# Patient Record
Sex: Female | Born: 1955 | Race: White | Hispanic: No | Marital: Married | Smoking: Former smoker
Health system: Southern US, Community
[De-identification: ages and names within clinical notes are randomized; demographics above are authoritative.]

## PROBLEM LIST (undated history)

## (undated) DIAGNOSIS — C4491 Basal cell carcinoma of skin, unspecified: Secondary | ICD-10-CM

## (undated) DIAGNOSIS — J310 Chronic rhinitis: Secondary | ICD-10-CM

## (undated) DIAGNOSIS — F419 Anxiety disorder, unspecified: Secondary | ICD-10-CM

## (undated) DIAGNOSIS — J449 Chronic obstructive pulmonary disease, unspecified: Secondary | ICD-10-CM

## (undated) DIAGNOSIS — J40 Bronchitis, not specified as acute or chronic: Secondary | ICD-10-CM

## (undated) DIAGNOSIS — K635 Polyp of colon: Secondary | ICD-10-CM

## (undated) DIAGNOSIS — K219 Gastro-esophageal reflux disease without esophagitis: Secondary | ICD-10-CM

## (undated) DIAGNOSIS — C801 Malignant (primary) neoplasm, unspecified: Secondary | ICD-10-CM

## (undated) DIAGNOSIS — E785 Hyperlipidemia, unspecified: Secondary | ICD-10-CM

## (undated) DIAGNOSIS — D649 Anemia, unspecified: Secondary | ICD-10-CM

## (undated) DIAGNOSIS — M1990 Unspecified osteoarthritis, unspecified site: Secondary | ICD-10-CM

## (undated) HISTORY — DX: Malignant (primary) neoplasm, unspecified: C80.1

## (undated) HISTORY — DX: Hyperlipidemia, unspecified: E78.5

## (undated) HISTORY — DX: Anxiety disorder, unspecified: F41.9

## (undated) HISTORY — PX: UPPER GASTROINTESTINAL ENDOSCOPY: SHX188

## (undated) HISTORY — DX: Polyp of colon: K63.5

## (undated) HISTORY — DX: Chronic rhinitis: J31.0

## (undated) HISTORY — PX: APPENDECTOMY: SHX54

## (undated) HISTORY — PX: BACK SURGERY: SHX140

## (undated) HISTORY — PX: COLONOSCOPY: SHX174

## (undated) HISTORY — DX: Basal cell carcinoma of skin, unspecified: C44.91

## (undated) HISTORY — DX: Gastro-esophageal reflux disease without esophagitis: K21.9

## (undated) HISTORY — DX: Bronchitis, not specified as acute or chronic: J40

---

## 1987-05-06 HISTORY — PX: VESICOVAGINAL FISTULA CLOSURE W/ TAH: SUR271

## 1997-10-10 ENCOUNTER — Ambulatory Visit (HOSPITAL_COMMUNITY): Admission: RE | Admit: 1997-10-10 | Discharge: 1997-10-10 | Payer: Self-pay | Admitting: *Deleted

## 1998-03-13 ENCOUNTER — Other Ambulatory Visit: Admission: RE | Admit: 1998-03-13 | Discharge: 1998-03-13 | Payer: Self-pay | Admitting: *Deleted

## 1998-05-01 ENCOUNTER — Encounter: Payer: Self-pay | Admitting: *Deleted

## 1998-05-01 ENCOUNTER — Ambulatory Visit (HOSPITAL_COMMUNITY): Admission: RE | Admit: 1998-05-01 | Discharge: 1998-05-01 | Payer: Self-pay | Admitting: *Deleted

## 1999-05-03 ENCOUNTER — Ambulatory Visit (HOSPITAL_COMMUNITY): Admission: RE | Admit: 1999-05-03 | Discharge: 1999-05-03 | Payer: Self-pay | Admitting: *Deleted

## 1999-05-03 ENCOUNTER — Encounter: Payer: Self-pay | Admitting: *Deleted

## 1999-07-16 ENCOUNTER — Emergency Department (HOSPITAL_COMMUNITY): Admission: EM | Admit: 1999-07-16 | Discharge: 1999-07-16 | Payer: Self-pay | Admitting: *Deleted

## 2000-03-16 ENCOUNTER — Other Ambulatory Visit: Admission: RE | Admit: 2000-03-16 | Discharge: 2000-03-16 | Payer: Self-pay | Admitting: *Deleted

## 2000-05-04 ENCOUNTER — Encounter: Payer: Self-pay | Admitting: *Deleted

## 2000-05-04 ENCOUNTER — Ambulatory Visit (HOSPITAL_COMMUNITY): Admission: RE | Admit: 2000-05-04 | Discharge: 2000-05-04 | Payer: Self-pay | Admitting: *Deleted

## 2001-05-04 ENCOUNTER — Ambulatory Visit (HOSPITAL_COMMUNITY): Admission: RE | Admit: 2001-05-04 | Discharge: 2001-05-04 | Payer: Self-pay | Admitting: *Deleted

## 2001-05-04 ENCOUNTER — Encounter: Payer: Self-pay | Admitting: *Deleted

## 2001-05-06 ENCOUNTER — Encounter: Payer: Self-pay | Admitting: *Deleted

## 2001-05-06 ENCOUNTER — Encounter: Admission: RE | Admit: 2001-05-06 | Discharge: 2001-05-06 | Payer: Self-pay | Admitting: *Deleted

## 2003-03-13 ENCOUNTER — Other Ambulatory Visit: Admission: RE | Admit: 2003-03-13 | Discharge: 2003-03-13 | Payer: Self-pay | Admitting: Obstetrics and Gynecology

## 2004-05-07 ENCOUNTER — Other Ambulatory Visit: Admission: RE | Admit: 2004-05-07 | Discharge: 2004-05-07 | Payer: Self-pay | Admitting: Obstetrics and Gynecology

## 2004-06-13 ENCOUNTER — Ambulatory Visit: Payer: Self-pay | Admitting: Internal Medicine

## 2004-06-19 ENCOUNTER — Ambulatory Visit: Payer: Self-pay | Admitting: Internal Medicine

## 2004-09-02 ENCOUNTER — Ambulatory Visit: Payer: Self-pay | Admitting: Internal Medicine

## 2004-09-13 ENCOUNTER — Ambulatory Visit: Payer: Self-pay | Admitting: Internal Medicine

## 2004-10-16 ENCOUNTER — Ambulatory Visit: Payer: Self-pay | Admitting: Internal Medicine

## 2004-10-24 ENCOUNTER — Ambulatory Visit: Payer: Self-pay | Admitting: Internal Medicine

## 2005-01-23 ENCOUNTER — Ambulatory Visit: Payer: Self-pay | Admitting: Internal Medicine

## 2005-05-09 ENCOUNTER — Ambulatory Visit: Payer: Self-pay | Admitting: Internal Medicine

## 2005-05-29 ENCOUNTER — Ambulatory Visit: Payer: Self-pay | Admitting: Internal Medicine

## 2005-06-12 ENCOUNTER — Other Ambulatory Visit: Admission: RE | Admit: 2005-06-12 | Discharge: 2005-06-12 | Payer: Self-pay | Admitting: Obstetrics and Gynecology

## 2005-06-17 ENCOUNTER — Ambulatory Visit: Payer: Self-pay | Admitting: Internal Medicine

## 2005-07-29 ENCOUNTER — Ambulatory Visit: Payer: Self-pay | Admitting: Internal Medicine

## 2005-09-11 ENCOUNTER — Ambulatory Visit: Payer: Self-pay | Admitting: Pulmonary Disease

## 2005-10-16 ENCOUNTER — Ambulatory Visit: Payer: Self-pay | Admitting: Internal Medicine

## 2005-10-28 ENCOUNTER — Ambulatory Visit: Payer: Self-pay | Admitting: Internal Medicine

## 2006-01-21 ENCOUNTER — Ambulatory Visit: Payer: Self-pay | Admitting: Internal Medicine

## 2006-01-30 ENCOUNTER — Ambulatory Visit: Payer: Self-pay | Admitting: Internal Medicine

## 2006-02-18 ENCOUNTER — Ambulatory Visit: Payer: Self-pay | Admitting: Internal Medicine

## 2006-03-27 ENCOUNTER — Ambulatory Visit: Payer: Self-pay | Admitting: Internal Medicine

## 2006-05-26 ENCOUNTER — Ambulatory Visit: Payer: Self-pay | Admitting: Pulmonary Disease

## 2006-06-08 ENCOUNTER — Ambulatory Visit: Payer: Self-pay | Admitting: Internal Medicine

## 2006-06-18 ENCOUNTER — Ambulatory Visit: Payer: Self-pay | Admitting: Internal Medicine

## 2006-06-18 LAB — CONVERTED CEMR LAB
ALT: 20 units/L (ref 0–40)
AST: 21 units/L (ref 0–37)
Albumin: 4.1 g/dL (ref 3.5–5.2)
Alkaline Phosphatase: 52 units/L (ref 39–117)
BUN: 17 mg/dL (ref 6–23)
Basophils Absolute: 0.1 10*3/uL (ref 0.0–0.1)
Basophils Relative: 1 % (ref 0.0–1.0)
Bilirubin, Direct: 0.1 mg/dL (ref 0.0–0.3)
CO2: 31 meq/L (ref 19–32)
CRP, High Sensitivity: 1 (ref 0.00–5.00)
Calcium: 9.1 mg/dL (ref 8.4–10.5)
Chloride: 101 meq/L (ref 96–112)
Cholesterol: 174 mg/dL (ref 0–200)
Creatinine, Ser: 0.6 mg/dL (ref 0.4–1.2)
Eosinophils Absolute: 0.1 10*3/uL (ref 0.0–0.6)
Eosinophils Relative: 2.5 % (ref 0.0–5.0)
GFR calc Af Amer: 134 mL/min
GFR calc non Af Amer: 111 mL/min
Glucose, Bld: 93 mg/dL (ref 70–99)
HCT: 40.9 % (ref 36.0–46.0)
HDL: 63.3 mg/dL (ref >39.0)
Hemoglobin: 14.3 g/dL (ref 12.0–15.0)
LDL Cholesterol: 92 mg/dL (ref 0–99)
Lymphocytes Relative: 26.7 % (ref 12.0–46.0)
MCHC: 34.9 g/dL (ref 30.0–36.0)
MCV: 89.5 fL (ref 78.0–100.0)
Monocytes Absolute: 0.3 10*3/uL (ref 0.2–0.7)
Monocytes Relative: 5.8 % (ref 3.0–11.0)
Neutro Abs: 3.5 10*3/uL (ref 1.4–7.7)
Neutrophils Relative %: 64 % (ref 43.0–77.0)
Platelets: 250 10*3/uL (ref 150–400)
Potassium: 3.5 meq/L (ref 3.5–5.1)
RBC: 4.56 M/uL (ref 3.87–5.11)
RDW: 11.8 % (ref 11.5–14.6)
Sodium: 138 meq/L (ref 135–145)
Total Bilirubin: 0.8 mg/dL (ref 0.3–1.2)
Total CHOL/HDL Ratio: 2.7
Total Protein: 6.5 g/dL (ref 6.0–8.3)
Triglycerides: 95 mg/dL (ref 0–149)
VLDL: 19 mg/dL (ref 0–40)
WBC: 5.5 10*3/uL (ref 4.5–10.5)

## 2006-07-17 ENCOUNTER — Ambulatory Visit: Payer: Self-pay | Admitting: Internal Medicine

## 2007-02-11 ENCOUNTER — Ambulatory Visit (HOSPITAL_COMMUNITY): Admission: RE | Admit: 2007-02-11 | Discharge: 2007-02-12 | Payer: Self-pay | Admitting: Obstetrics and Gynecology

## 2007-02-24 ENCOUNTER — Ambulatory Visit: Payer: Self-pay | Admitting: Internal Medicine

## 2007-03-04 DIAGNOSIS — E785 Hyperlipidemia, unspecified: Secondary | ICD-10-CM | POA: Insufficient documentation

## 2007-03-04 DIAGNOSIS — J310 Chronic rhinitis: Secondary | ICD-10-CM | POA: Insufficient documentation

## 2007-03-04 DIAGNOSIS — R05 Cough: Secondary | ICD-10-CM

## 2007-03-04 DIAGNOSIS — R059 Cough, unspecified: Secondary | ICD-10-CM | POA: Insufficient documentation

## 2007-03-04 DIAGNOSIS — K219 Gastro-esophageal reflux disease without esophagitis: Secondary | ICD-10-CM | POA: Insufficient documentation

## 2007-03-04 DIAGNOSIS — D126 Benign neoplasm of colon, unspecified: Secondary | ICD-10-CM | POA: Insufficient documentation

## 2007-03-06 HISTORY — PX: OTHER SURGICAL HISTORY: SHX169

## 2007-05-31 ENCOUNTER — Telehealth (INDEPENDENT_AMBULATORY_CARE_PROVIDER_SITE_OTHER): Payer: Self-pay | Admitting: *Deleted

## 2007-06-02 ENCOUNTER — Ambulatory Visit: Payer: Self-pay | Admitting: Internal Medicine

## 2007-06-02 DIAGNOSIS — J019 Acute sinusitis, unspecified: Secondary | ICD-10-CM | POA: Insufficient documentation

## 2007-06-25 ENCOUNTER — Ambulatory Visit: Payer: Self-pay | Admitting: Internal Medicine

## 2007-06-25 DIAGNOSIS — R209 Unspecified disturbances of skin sensation: Secondary | ICD-10-CM | POA: Insufficient documentation

## 2007-06-25 DIAGNOSIS — R1032 Left lower quadrant pain: Secondary | ICD-10-CM | POA: Insufficient documentation

## 2007-06-29 LAB — CONVERTED CEMR LAB
ALT: 18 units/L (ref 0–35)
AST: 18 units/L (ref 0–37)
Albumin: 4.1 g/dL (ref 3.5–5.2)
Alkaline Phosphatase: 75 units/L (ref 39–117)
BUN: 17 mg/dL (ref 6–23)
Basophils Absolute: 0.1 10*3/uL (ref 0.0–0.1)
Basophils Relative: 1.1 % — ABNORMAL HIGH (ref 0.0–1.0)
Bilirubin, Direct: 0.2 mg/dL (ref 0.0–0.3)
CO2: 31 meq/L (ref 19–32)
Calcium: 9.6 mg/dL (ref 8.4–10.5)
Chloride: 103 meq/L (ref 96–112)
Cholesterol: 175 mg/dL (ref 0–200)
Creatinine, Ser: 0.5 mg/dL (ref 0.4–1.2)
Eosinophils Absolute: 0.1 10*3/uL (ref 0.0–0.6)
Eosinophils Relative: 2.3 % (ref 0.0–5.0)
GFR calc Af Amer: 165 mL/min
GFR calc non Af Amer: 137 mL/min
Glucose, Bld: 107 mg/dL — ABNORMAL HIGH (ref 70–99)
HCT: 42.3 % (ref 36.0–46.0)
HDL: 50.6 mg/dL (ref >39.0)
Hemoglobin: 14.2 g/dL (ref 12.0–15.0)
LDL Cholesterol: 103 mg/dL — ABNORMAL HIGH (ref 0–99)
Lymphocytes Relative: 20.2 % (ref 12.0–46.0)
MCHC: 33.6 g/dL (ref 30.0–36.0)
MCV: 92 fL (ref 78.0–100.0)
Monocytes Absolute: 0.4 10*3/uL (ref 0.2–0.7)
Monocytes Relative: 6.6 % (ref 3.0–11.0)
Neutro Abs: 4.3 10*3/uL (ref 1.4–7.7)
Neutrophils Relative %: 69.8 % (ref 43.0–77.0)
Platelets: 234 10*3/uL (ref 150–400)
Potassium: 4 meq/L (ref 3.5–5.1)
RBC: 4.6 M/uL (ref 3.87–5.11)
RDW: 11.8 % (ref 11.5–14.6)
Sodium: 141 meq/L (ref 135–145)
TSH: 0.98 microintl units/mL (ref 0.35–5.50)
Total Bilirubin: 0.8 mg/dL (ref 0.3–1.2)
Total CHOL/HDL Ratio: 3.5
Total Protein: 7.3 g/dL (ref 6.0–8.3)
Triglycerides: 108 mg/dL (ref 0–149)
VLDL: 22 mg/dL (ref 0–40)
WBC: 6.2 10*3/uL (ref 4.5–10.5)

## 2007-08-04 ENCOUNTER — Ambulatory Visit: Payer: Self-pay | Admitting: Internal Medicine

## 2007-09-03 ENCOUNTER — Telehealth (INDEPENDENT_AMBULATORY_CARE_PROVIDER_SITE_OTHER): Payer: Self-pay | Admitting: *Deleted

## 2007-09-21 ENCOUNTER — Ambulatory Visit: Payer: Self-pay | Admitting: Internal Medicine

## 2007-09-21 DIAGNOSIS — M5417 Radiculopathy, lumbosacral region: Secondary | ICD-10-CM | POA: Insufficient documentation

## 2007-10-20 ENCOUNTER — Encounter: Payer: Self-pay | Admitting: Internal Medicine

## 2008-02-02 ENCOUNTER — Ambulatory Visit: Payer: Self-pay | Admitting: Pulmonary Disease

## 2008-06-07 ENCOUNTER — Ambulatory Visit: Payer: Self-pay | Admitting: Internal Medicine

## 2008-06-16 ENCOUNTER — Encounter: Payer: Self-pay | Admitting: Internal Medicine

## 2008-07-07 ENCOUNTER — Encounter (INDEPENDENT_AMBULATORY_CARE_PROVIDER_SITE_OTHER): Payer: Self-pay | Admitting: *Deleted

## 2008-07-12 ENCOUNTER — Ambulatory Visit: Payer: Self-pay | Admitting: Internal Medicine

## 2008-07-14 LAB — CONVERTED CEMR LAB
ALT: 23 units/L (ref 0–35)
AST: 20 units/L (ref 0–37)
Albumin: 4.2 g/dL (ref 3.5–5.2)
Alkaline Phosphatase: 73 units/L (ref 39–117)
BUN: 15 mg/dL (ref 6–23)
Basophils Absolute: 0 10*3/uL (ref 0.0–0.1)
Basophils Relative: 0.1 % (ref 0.0–3.0)
Bilirubin, Direct: 0.2 mg/dL (ref 0.0–0.3)
CO2: 30 meq/L (ref 19–32)
CRP, High Sensitivity: 7 — ABNORMAL HIGH (ref 0.00–5.00)
Calcium: 9.3 mg/dL (ref 8.4–10.5)
Chloride: 102 meq/L (ref 96–112)
Cholesterol: 206 mg/dL (ref 0–200)
Creatinine, Ser: 0.6 mg/dL (ref 0.4–1.2)
Direct LDL: 128.2 mg/dL
Eosinophils Absolute: 0.2 10*3/uL (ref 0.0–0.7)
Eosinophils Relative: 2.6 % (ref 0.0–5.0)
GFR calc Af Amer: 133 mL/min
GFR calc non Af Amer: 110 mL/min
Glucose, Bld: 85 mg/dL (ref 70–99)
HCT: 41.9 % (ref 36.0–46.0)
HDL: 53.8 mg/dL (ref >39.0)
Hemoglobin: 14.6 g/dL (ref 12.0–15.0)
Lymphocytes Relative: 21.9 % (ref 12.0–46.0)
MCHC: 34.8 g/dL (ref 30.0–36.0)
MCV: 88.9 fL (ref 78.0–100.0)
Monocytes Absolute: 0.1 10*3/uL (ref 0.1–1.0)
Monocytes Relative: 1.9 % — ABNORMAL LOW (ref 3.0–12.0)
Neutro Abs: 4.5 10*3/uL (ref 1.4–7.7)
Neutrophils Relative %: 73.5 % (ref 43.0–77.0)
Platelets: 223 10*3/uL (ref 150–400)
Potassium: 3.9 meq/L (ref 3.5–5.1)
RBC: 4.71 M/uL (ref 3.87–5.11)
RDW: 12.7 % (ref 11.5–14.6)
Sodium: 139 meq/L (ref 135–145)
TSH: 1.18 microintl units/mL (ref 0.35–5.50)
Total Bilirubin: 0.9 mg/dL (ref 0.3–1.2)
Total CHOL/HDL Ratio: 3.8
Total Protein: 6.7 g/dL (ref 6.0–8.3)
Triglycerides: 100 mg/dL (ref 0–149)
VLDL: 20 mg/dL (ref 0–40)
Vit D, 25-Hydroxy: 38 ng/mL (ref 30–89)
WBC: 6.2 10*3/uL (ref 4.5–10.5)

## 2008-07-17 ENCOUNTER — Telehealth (INDEPENDENT_AMBULATORY_CARE_PROVIDER_SITE_OTHER): Payer: Self-pay | Admitting: *Deleted

## 2008-08-30 ENCOUNTER — Encounter: Payer: Self-pay | Admitting: Internal Medicine

## 2008-08-30 ENCOUNTER — Emergency Department (HOSPITAL_COMMUNITY): Admission: EM | Admit: 2008-08-30 | Discharge: 2008-08-30 | Payer: Self-pay | Admitting: Emergency Medicine

## 2008-08-31 ENCOUNTER — Ambulatory Visit: Payer: Self-pay | Admitting: Internal Medicine

## 2008-08-31 DIAGNOSIS — F17200 Nicotine dependence, unspecified, uncomplicated: Secondary | ICD-10-CM | POA: Insufficient documentation

## 2008-08-31 DIAGNOSIS — R042 Hemoptysis: Secondary | ICD-10-CM | POA: Insufficient documentation

## 2008-09-02 ENCOUNTER — Ambulatory Visit: Payer: Self-pay | Admitting: Critical Care Medicine

## 2008-09-02 ENCOUNTER — Inpatient Hospital Stay (HOSPITAL_COMMUNITY): Admission: EM | Admit: 2008-09-02 | Discharge: 2008-09-05 | Payer: Self-pay | Admitting: Emergency Medicine

## 2008-09-04 ENCOUNTER — Encounter: Payer: Self-pay | Admitting: Critical Care Medicine

## 2008-09-06 ENCOUNTER — Telehealth (INDEPENDENT_AMBULATORY_CARE_PROVIDER_SITE_OTHER): Payer: Self-pay | Admitting: *Deleted

## 2008-09-07 ENCOUNTER — Ambulatory Visit: Payer: Self-pay | Admitting: Internal Medicine

## 2008-09-14 ENCOUNTER — Ambulatory Visit: Payer: Self-pay | Admitting: Internal Medicine

## 2008-09-22 ENCOUNTER — Ambulatory Visit: Payer: Self-pay | Admitting: Internal Medicine

## 2008-10-17 ENCOUNTER — Ambulatory Visit: Payer: Self-pay | Admitting: Internal Medicine

## 2008-10-18 ENCOUNTER — Ambulatory Visit: Admission: RE | Admit: 2008-10-18 | Discharge: 2008-10-18 | Payer: Self-pay | Admitting: Internal Medicine

## 2008-10-18 ENCOUNTER — Ambulatory Visit: Payer: Self-pay | Admitting: Internal Medicine

## 2008-10-19 ENCOUNTER — Telehealth (INDEPENDENT_AMBULATORY_CARE_PROVIDER_SITE_OTHER): Payer: Self-pay | Admitting: *Deleted

## 2008-10-31 ENCOUNTER — Ambulatory Visit: Payer: Self-pay | Admitting: Internal Medicine

## 2008-10-31 ENCOUNTER — Encounter: Payer: Self-pay | Admitting: Internal Medicine

## 2008-11-01 ENCOUNTER — Encounter: Payer: Self-pay | Admitting: Internal Medicine

## 2008-11-15 ENCOUNTER — Ambulatory Visit: Payer: Self-pay | Admitting: Internal Medicine

## 2009-01-16 ENCOUNTER — Ambulatory Visit: Payer: Self-pay | Admitting: Internal Medicine

## 2009-01-16 DIAGNOSIS — G479 Sleep disorder, unspecified: Secondary | ICD-10-CM | POA: Insufficient documentation

## 2009-01-17 LAB — CONVERTED CEMR LAB
ALT: 19 units/L (ref 0–35)
AST: 22 units/L (ref 0–37)
Albumin: 4.1 g/dL (ref 3.5–5.2)
Alkaline Phosphatase: 72 units/L (ref 39–117)
Bilirubin, Direct: 0 mg/dL (ref 0.0–0.3)
CRP, High Sensitivity: 5.9 — ABNORMAL HIGH (ref 0.00–5.00)
Cholesterol: 206 mg/dL — ABNORMAL HIGH (ref 0–200)
Direct LDL: 132.7 mg/dL
HDL: 54 mg/dL (ref >39.00)
Total Bilirubin: 0.9 mg/dL (ref 0.3–1.2)
Total CHOL/HDL Ratio: 4
Total Protein: 6.8 g/dL (ref 6.0–8.3)
Triglycerides: 181 mg/dL — ABNORMAL HIGH (ref 0.0–149.0)
VLDL: 36.2 mg/dL (ref 0.0–40.0)

## 2009-02-13 ENCOUNTER — Ambulatory Visit: Payer: Self-pay | Admitting: Internal Medicine

## 2009-05-03 ENCOUNTER — Telehealth: Payer: Self-pay | Admitting: Internal Medicine

## 2009-05-08 ENCOUNTER — Ambulatory Visit: Payer: Self-pay | Admitting: Internal Medicine

## 2009-06-27 ENCOUNTER — Ambulatory Visit: Payer: Self-pay | Admitting: Internal Medicine

## 2009-07-02 DIAGNOSIS — J209 Acute bronchitis, unspecified: Secondary | ICD-10-CM | POA: Insufficient documentation

## 2009-07-20 ENCOUNTER — Ambulatory Visit: Payer: Self-pay | Admitting: Internal Medicine

## 2009-10-02 ENCOUNTER — Ambulatory Visit: Payer: Self-pay | Admitting: Internal Medicine

## 2009-10-02 DIAGNOSIS — J449 Chronic obstructive pulmonary disease, unspecified: Secondary | ICD-10-CM | POA: Insufficient documentation

## 2009-10-03 LAB — CONVERTED CEMR LAB
ALT: 25 units/L (ref 0–35)
AST: 27 units/L (ref 0–37)
Albumin: 4.3 g/dL (ref 3.5–5.2)
Alkaline Phosphatase: 68 units/L (ref 39–117)
BUN: 11 mg/dL (ref 6–23)
Basophils Absolute: 0 10*3/uL (ref 0.0–0.1)
Basophils Relative: 0.5 % (ref 0.0–3.0)
Bilirubin, Direct: 0 mg/dL (ref 0.0–0.3)
CO2: 27 meq/L (ref 19–32)
Calcium: 9.4 mg/dL (ref 8.4–10.5)
Chloride: 102 meq/L (ref 96–112)
Cholesterol: 208 mg/dL — ABNORMAL HIGH (ref 0–200)
Creatinine, Ser: 0.6 mg/dL (ref 0.4–1.2)
Direct LDL: 131 mg/dL
Eosinophils Absolute: 0.1 10*3/uL (ref 0.0–0.7)
Eosinophils Relative: 2.4 % (ref 0.0–5.0)
GFR calc non Af Amer: 109.78 mL/min (ref >60)
Glucose, Bld: 95 mg/dL (ref 70–99)
HCT: 40.8 % (ref 36.0–46.0)
HDL: 57.4 mg/dL (ref >39.00)
Hemoglobin: 14 g/dL (ref 12.0–15.0)
Lymphocytes Relative: 24.3 % (ref 12.0–46.0)
Lymphs Abs: 1.3 10*3/uL (ref 0.7–4.0)
MCHC: 34.4 g/dL (ref 30.0–36.0)
MCV: 90.7 fL (ref 78.0–100.0)
Monocytes Absolute: 0.3 10*3/uL (ref 0.1–1.0)
Monocytes Relative: 5.6 % (ref 3.0–12.0)
Neutro Abs: 3.5 10*3/uL (ref 1.4–7.7)
Neutrophils Relative %: 67.2 % (ref 43.0–77.0)
Platelets: 243 10*3/uL (ref 150.0–400.0)
Potassium: 4.1 meq/L (ref 3.5–5.1)
RBC: 4.49 M/uL (ref 3.87–5.11)
RDW: 13.2 % (ref 11.5–14.6)
Sodium: 138 meq/L (ref 135–145)
TSH: 0.96 microintl units/mL (ref 0.35–5.50)
Total Bilirubin: 0.1 mg/dL — ABNORMAL LOW (ref 0.3–1.2)
Total CHOL/HDL Ratio: 4
Total Protein: 7.2 g/dL (ref 6.0–8.3)
Triglycerides: 200 mg/dL — ABNORMAL HIGH (ref 0.0–149.0)
VLDL: 40 mg/dL (ref 0.0–40.0)
Vit D, 25-Hydroxy: 62 ng/mL (ref 30–89)
WBC: 5.3 10*3/uL (ref 4.5–10.5)

## 2010-01-15 ENCOUNTER — Ambulatory Visit: Payer: Self-pay | Admitting: Internal Medicine

## 2010-01-15 DIAGNOSIS — R079 Chest pain, unspecified: Secondary | ICD-10-CM | POA: Insufficient documentation

## 2010-01-15 DIAGNOSIS — R42 Dizziness and giddiness: Secondary | ICD-10-CM | POA: Insufficient documentation

## 2010-01-31 ENCOUNTER — Ambulatory Visit: Payer: Self-pay | Admitting: Internal Medicine

## 2010-02-05 ENCOUNTER — Telehealth (INDEPENDENT_AMBULATORY_CARE_PROVIDER_SITE_OTHER): Payer: Self-pay | Admitting: *Deleted

## 2010-04-11 ENCOUNTER — Telehealth (INDEPENDENT_AMBULATORY_CARE_PROVIDER_SITE_OTHER): Payer: Self-pay | Admitting: *Deleted

## 2010-06-04 NOTE — Assessment & Plan Note (Signed)
Summary: Primary svc/ insomnia/ smoking   Primary Provider/Referring Provider:  [name]  CC:  Acute visit.  Pt c/o trouble sleeping x 2 months.  Pt states that she takes xanax to help her sleep and this helps her fall asleep but she does not stay asleep.  She wakes up about every hour.  Pt also started smoking again- 5 cigs per day.Marland Kitchen  History of Present Illness: 63 yowf active smoker with h/o hyperlipidemia and chronic bronchitis   August 31, 2008 new onset hempotysis assoc with epistaxis started without antecedent uri x 24 hours, abrupt onset,    rx as aecopd .  Sep 02, 2008  to ED with recurrent hemoptysis.  At least 2 cups of blood bright red. admit thru May 4 with FOB 5/3 clot over rul  with neg cytology.  Sep 07, 2008 ov cc osb, mild sense of chest congestion, minimal old blood coughed up x 24 hours.   Sep 14, 2008--ov  follow up and med review. Last visit Advair 250/50 added and prilosec increased two times a day. Over last week, less coughing, w/ less old blood mixed in clear mucous. Starting to feel better, w/ improved energy.  10/18/08 neg fob  November 15, 2008 ov smoking 1-2 per day vs 10 per day.   May 08, 2009 Acute visit.  Pt c/o trouble sleeping x 2 months.  Pt states that she takes xanax to help her sleep and this helps her fall asleep but she does not stay asleep.  She wakes up about every hour.  Pt also started smoking again- 5 cigs per day. no more hemoptysis.  lots of stress re family/work.    Current Medications (verified): 1)  Multivitamins   Tabs (Multiple Vitamin) .... Take 1 Tablet By Mouth Once A Day 2)  Vitamin C 500 Mg  Tabs (Ascorbic Acid) .... Take 1 Tablet By Mouth Once A Day 3)  Omeprazole 20 Mg  Cpdr (Omeprazole) .... Take  One 30-60 Min Before First Meal of The Day 4)  Calcium 500 500 Mg  Tabs (Calcium Carbonate) .... 2 Daily 5)  Vivelle-Dot 0.1 Mg/24hr  Pttw (Estradiol) .... Use As Directed Each Monday and Friday 6)  Vytorin 10-80 Mg  Tabs  (Ezetimibe-Simvastatin) .... 1/2 At Bedtime 7)  Advair Diskus 250-50 Mcg/dose  Misc (Fluticasone-Salmeterol) .... One Puff Once Daily 8)  Miralax   Powd (Polyethylene Glycol 3350) .... Once Daily 9)  Diethylpropion Hcl Cr 75 Mg Xr24h-Tab (Diethylpropion Hcl) .... Take 1 Tablet By Mouth Daily As Needed 10)  Ultram 50 Mg  Tabs (Tramadol Hcl) .... Take One Tab Every Four Hours As Needed For Cough or Pain 11)  Alprazolam 0.5 Mg Tbdp (Alprazolam) .... One Every 6 Hours If Needed For Nerves or Breathessless 12)  Mucinex Dm 30-600 Mg  Tb12 (Dextromethorphan-Guaifenesin) .Marland Kitchen.. 1-2 Every 12 Hours For Congestion or Cough Every 12 Hours 13)  Afrin Nasal Spray 0.05 %  Soln (Oxymetazoline Hcl) .... As Needed 14)  Kenalog  Aers (Triamcinolone Acetonide) in [place] .... Apply Four Times A Day 15)  Valtrex 500 Mg Tabs (Valacyclovir Hcl) .... 1/2 Once Daily 16)  Striant 30 Mg Misc (Testosterone) .... As Directed 3 Times A Wk  Allergies (verified): No Known Drug Allergies  Past History:  Past Medical History: Hx of COUGH (ICD-786.2) Hemoptysis/epistaxis August 31, 2008 ..............................................Marland KitchenWert     - FOB 09/04/2008 clot lodged over rul orifice, CT chest: no  mass neg PE     - FOB  10/18/08 negative GERD (ICD-530.81)     -  Neg EGD 5/99     -  Neg 24h ph study 9/99 POLYP, COLON (ICD-211.3)............................................................Marland KitchenMarina Goodell      - Colonoscopy 07/07/03       - Colonoscopy 10/31/08 :  1) Adenomatous Polyps, multiple (6) removed,  Severe diverticulosis  RHINITIS, CHRONIC (ICD-472.0) HYPERLIPIDEMIA (ICD-272.4)     - Target LDL < 130 (pos fm hx, pos smoking hx) HEALTH MAINTENANCE..................................................................Marland KitchenWert      -  dt  2/06      -  pneumovax 2/07      -  DEXA nl 08/18/06      -  CPX  July 12, 2008     Vital Signs:  Patient profile:   55 year old female Weight:      136 pounds O2 Sat:      98 % on Room  air Temp:     98.2 degrees F oral Pulse rate:   92 / minute BP sitting:   118 / 76  (left arm)  Vitals Entered By: Vernie Murders (May 08, 2009 3:07 PM)  O2 Flow:  Room air  Physical Exam  Additional Exam:  Amb depressed/anxious  wf NAD wt  129 Sep 22, 2008 > 132 November 15, 2008 > 136 January 16, 2009 > 136 May 08, 2009 HEENT: nl dentition, turbinates,  sev aphthos ulcers left buchal mucosa. Nl external ear canals without cough reflex, NM pale  w/ clear d/ch,    Neck without JVD/Nodes/TM Lungs CTA no wheezing or crackles. RRR no s3 or murmur or increase in P2. No edema Abd soft and benign with nl excursion in the supine position. No bruits or organomegaly Ext without clubbing, cyanosis, calf tenderness   Impression & Recommendations:  Problem # 1:  SLEEP DISORDER UNSPECIFIED (ICD-780.50)  Sleep hygiene reviewed, ok to try Tylenol pm and add trazadone next vs refer to psychology.  Orders: Est. Patient Level III (04540)  Problem # 2:  SMOKER (ICD-305.1)  Discussed but not ready to committ to quit at this point - emphasized risks involved in continuing smoking and that patient should consider these in the context of the cost of smoking relative to the benefit obtained.   Orders: Est. Patient Level III (98119)  Medications Added to Medication List This Visit: 1)  Vytorin 10-80 Mg Tabs (Ezetimibe-simvastatin) .... 1/2 at bedtime 2)  Advair Diskus 250-50 Mcg/dose Misc (Fluticasone-salmeterol) .... One puff once daily 3)  Valtrex 500 Mg Tabs (Valacyclovir hcl) .... 1/2 once daily 4)  Striant 30 Mg Misc (Testosterone) .... As directed 3 times a wk  Patient Instructions: 1)  Try to go to bedtime and wake up same time every day 2)  For at least 30 min before bed read with a dull light something dull like a history textbook 3)  If this is ineffective try adding  Tylenol pm one at bedtime Prescriptions: ALPRAZOLAM 0.5 MG TBDP (ALPRAZOLAM) one every 6 hours if needed for  nerves or breathessless  #90 x 0   Entered and Authorized by:   Nyoka Cowden MD   Signed by:   Nyoka Cowden MD on 05/08/2009   Method used:   Print then Give to Patient   RxID:   [id number]

## 2010-06-04 NOTE — Progress Notes (Signed)
Summary: COUGH  Phone Note Call from Patient   Caller: Patient Call For: [name] Summary of Call: PT HAS COUGH SHOULD SHE GET OVER THE COUNTER OR PRESCRIPT COUGH SYRUP PLS ADVISE Initial call taken by: Rickard Patience,  Sep 06, 2008 9:01 AM  Follow-up for Phone Call        Called spoke with pt advised to try OTC Delsym to suppress cough.  Advised pt if cough persists despite addition of Delsym call back for further intervention. Follow-up by: Cloyde Reams RN,  Sep 06, 2008 9:13 AM

## 2010-06-04 NOTE — Assessment & Plan Note (Signed)
Summary: flu vaccine///kp  Nurse Visit    Prior Medications: BAYER LOW STRENGTH 81 MG  TBEC (ASPIRIN) take one tab by mouth once daily OMEPRAZOLE 20 MG  CPDR (OMEPRAZOLE) take one tab by mouth once daily VIVELLE-DOT 0.1 MG/24HR  PTTW (ESTRADIOL) use as directed each monday and friday VYTORIN 10-80 MG  TABS (EZETIMIBE-SIMVASTATIN) take 1/2 tab by mouth once daily DIETHYLPROPION HCL CR 75 MG  TB24 (DIETHYLPROPION HCL) take one tab by mouth once daily as needed MIRALAX   POWD (POLYETHYLENE GLYCOL 3350) once daily FLUOCINONIDE 0.05 %  CREA (FLUOCINONIDE) apply as needed ULTRAM 50 MG  TABS (TRAMADOL HCL) take one tab every four hours as needed AFRIN NASAL SPRAY 0.05 %  SOLN (OXYMETAZOLINE HCL) as needed CALCIUM 500 500 MG  TABS (CALCIUM CARBONATE) as directed MULTIVITAMINS   TABS (MULTIPLE VITAMIN) Take 1 tablet by mouth once a day MUCINEX DM 30-600 MG  TB12 (DEXTROMETHORPHAN-GUAIFENESIN) every 12 hours as needed ADVIL COLD/SINUS 30-200 MG  TABS (PSEUDOEPHEDRINE-IBUPROFEN) as needed PREDNISONE 10 MG  TABS (PREDNISONE) 4 each am x 2days, 2x2days, 1x2days and stop Current Allergies: No known allergies     Orders Added: 1)  Admin 1st Vaccine [90471] 2)  Flu Vaccine 75yrs + [16109]    Flu Vaccine Consent Questions     Do you have a history of severe allergic reactions to this vaccine? no    Any prior history of allergic reactions to egg and/or gelatin? no    Do you have a sensitivity to the preservative Thimersol? no    Do you have a past history of Guillan-Barre Syndrome? no    Do you currently have an acute febrile illness? no    Have you ever had a severe reaction to latex? no    Vaccine information given and explained to patient? yes    Are you currently pregnant? no    Lot Number:[id number]   Site Given  Left Deltoid IM Azucena Kuba, CMA  February 01, 2008 .opcflu   ]

## 2010-06-04 NOTE — Assessment & Plan Note (Signed)
Summary: SINIS/ CONGESTION///KP   Chief Complaint:  sinus pain and pressure x2 weeks - pt states that when she doesn't take 2 advils w/ mucinex every 4 hours she has "bad" pain in her right cheek bone and behind her right eye.  History of Present Illness: 55 year old with known history of chronic rhinitis, GERD and cyclical cough that presents for an acute office visit. Complains of a 2 weeks nasal congestion, sinus pain and pressure, increased right, teeth pain. Used mucinex and nasal sprays without much relief. Denies chest pain, dyspnea, orthopnea, hemoptysis, fever, n/v/d, edema.     Current Allergies (reviewed today): No known allergies  Updated/Current Medications (including changes made in today's visit):  BAYER LOW STRENGTH 81 MG  TBEC (ASPIRIN) take one tab by mouth once daily OMEPRAZOLE 20 MG  CPDR (OMEPRAZOLE) take one tab by mouth once daily VIVELLE-DOT 0.1 MG/24HR  PTTW (ESTRADIOL) use as directed each monday and friday VYTORIN 10-80 MG  TABS (EZETIMIBE-SIMVASTATIN) take 1/2 tab by mouth once daily DIETHYLPROPION HCL CR 75 MG  TB24 (DIETHYLPROPION HCL) take one tab by mouth once daily as needed FLUOCINONIDE 0.05 %  CREA (FLUOCINONIDE) apply as needed ULTRAM 50 MG  TABS (TRAMADOL HCL) take one tab every four hours as needed AFRIN NASAL SPRAY 0.05 %  SOLN (OXYMETAZOLINE HCL) as needed CALCIUM 500 500 MG  TABS (CALCIUM CARBONATE) as directed MULTIVITAMINS   TABS (MULTIPLE VITAMIN) Take 1 tablet by mouth once a day MUCINEX DM 30-600 MG  TB12 (DEXTROMETHORPHAN-GUAIFENESIN) every 12 hours as needed ADVIL COLD/SINUS 30-200 MG  TABS (PSEUDOEPHEDRINE-IBUPROFEN) as needed OMNICEF 300 MG  CAPS (CEFDINIR) 2 by mouth once daily   Past Medical History:        Hx of COUGH (ICD-786.2)    GERD (ICD-530.81)    POLYP, COLON (ICD-211.3)    RHINITIS, CHRONIC (ICD-472.0)    HYPERLIPIDEMIA (ICD-272.4)          Review of Systems      See HPI   Vital Signs:  Patient Profile:   55  Years Old Female Weight:      141.38 pounds O2 Sat:      98 % O2 treatment:    Room Air Temp:     97.2 degrees F oral Pulse rate:   93 / minute BP sitting:   102 / 70  (left arm) Cuff size:   regular  Vitals Entered By: Darra Lis RMA (June 02, 2007 12:04 PM)             Is Patient Diabetic? No Comments meds reviewed ..................................................................Marland KitchenDarra Lis RMA  June 02, 2007 12:09 PM      Physical Exam  Pt is in no acute distress. Vital signs stable.  HEENT: Nasal mucosa erythematous with mild edema.  Ear canals are clear bilaterally. Maxillary sinus pressure. NECK: Supple without cervical adenopathy or tenderness. Trachea is midline without lymphadenopathy. LUNGS: Lung fields are clear bilaterally. HEART: Regular rate and rhythm without murmur, gallop or rub. ABDOMEN: Soft, benign. EXTREMITIES: Warm without calf tenderness, cyanosis, clubbing or edema.       Impression & Recommendations:  Problem # 1:  SINUSITIS, ACUTE (ICD-461.9) Begin Omnicef for 10 days. Mucinex two times a day as needed congestion Saline and Afrin for 5 days. Advil cold and sinus as needed. follow up as scheduled and as needed Please contact office for sooner follow up if symptoms do not improve or worsen  Her updated medication list for this problem includes:    Afrin  Nasal Spray 0.05 % Soln (Oxymetazoline hcl) .Marland Kitchen... As needed    Mucinex Dm 30-600 Mg Tb12 (Dextromethorphan-guaifenesin) ..... Every 12 hours as needed    Advil Cold/sinus 30-200 Mg Tabs (Pseudoephedrine-ibuprofen) .Marland Kitchen... As needed    Omnicef 300 Mg Caps (Cefdinir) .Marland Kitchen... 2 by mouth once daily  Orders: Est. Patient Level III (30160)   Medications Added to Medication List This Visit: 1)  Afrin Nasal Spray 0.05 % Soln (Oxymetazoline hcl) .... As needed 2)  Calcium  .... Use as directed 3)  Calcium 500 500 Mg Tabs (Calcium carbonate) .... As directed 4)  Vitamins  .... Take 1  tablet by mouth once a day 5)  Multivitamins Tabs (Multiple vitamin) .... Take 1 tablet by mouth once a day 6)  Musinex  .... Once every 12 hours 7)  Mucinex Dm 30-600 Mg Tb12 (Dextromethorphan-guaifenesin) .... Every 12 hours as needed 8)  Advil Cold/sinus 30-200 Mg Tabs (Pseudoephedrine-ibuprofen) .... As needed 9)  Advil Cold and Sinus  .... Twice daily 10)  Omnicef 300 Mg Caps (Cefdinir) .... 2 by mouth once daily   Patient Instructions: 1)  Please schedule a follow-up appointment in 1 month. Dr. [name]  2)  Please contact office for sooner follow up if symptoms do not improve or worsen  3)  Omnicef for 10 days 4)  Mucinex two times a day as needed congestion 5)  Saline and Afrin for 5 days 6)  Advil cold and sinus as needed     Prescriptions: OMNICEF 300 MG  CAPS (CEFDINIR) 2 by mouth once daily  #20 x 0   Entered and Authorized by:   Rubye Oaks NP   Signed by:   Tammy Parrett NP on 06/02/2007   Method used:   Electronically sent to ...       [facility]  [address] [id number]       [address]       [place], [place]  [address]       Ph: [phone] or [phone]       Fax: [phone]   RxID:   [id number]  ]

## 2010-06-04 NOTE — Assessment & Plan Note (Signed)
Summary: Primary svc/ ext f/u ov, multiple issues   Primary Provider/Referring Provider:  [name]  CC:  Followup for fasting labs.  No complaints today.  Marland Kitchen  History of Present Illness: 19 yowf active smoker with h/o hyperlipidemia and chronic bronchitis   August 31, 2008 new onset hempotysis assoc with epistaxis started without antecedent uri x 24 hours, abrupt onset,     Rx  1)  mucinex dm 2 every 12 hours and add tramadol 50 mg up to 2 every 4 hours to suppress the urge to cough. Swallowing water or using ice chips/non mint and menthol containing candies (such as lifesavers or sugarless jolly ranchers) are also effective.  2)  Doxycyclinine 100mg  twice daily x 10 days 3)  Prednisone 10 4 each am x 2days, 2x2days, 1x2days and stop  4)  Bedrest for 72 hours and no smoking  5)  no aspirin or flonase or advil cold and sinus 6)  no smoking   Sep 02, 2008  to ED with recurrent hemoptysis.  At least 2 cups of blood bright red. admit thru May 4 with FOB 5/3 clot over rul  with neg cytology.  Sep 07, 2008 ov cc osb, mild sense of chest congestion, minimal old blood coughed up x 24 hours.   Sep 14, 2008--ov  follow up and med review. Last visit Advair 250/50 added and prilosec increased two times a day. Over last week, less coughing, w/ less old blood mixed in clear mucous. Starting to feel better, w/ improved energy.  10/18/08 neg fob  November 15, 2008 ov smoking 1-2 per day vs 10 per day.   January 16, 2009 ov still smoking 1-2 per day but Pt denies any significant sore throat, dyphagia, itching, sneezing,  nasal congestion or excess secretions or cough,  fever, chills, sweats, unintended wt loss, pleuritic or exertional cp, change in activity tolerance  orthopnea pnd or leg swelling, no hemoptysis  Current Medications (verified): 1)  Multivitamins   Tabs (Multiple Vitamin) .... Take 1 Tablet By Mouth Once A Day 2)  Vitamin C 500 Mg  Tabs (Ascorbic Acid) .... Take 1 Tablet By Mouth Once A  Day 3)  Omeprazole 20 Mg  Cpdr (Omeprazole) .... Take  One 30-60 Min Before First Meal of The Day 4)  Calcium 500 500 Mg  Tabs (Calcium Carbonate) .... 2 Daily 5)  Vivelle-Dot 0.1 Mg/24hr  Pttw (Estradiol) .... Use As Directed Each Monday and Friday 6)  Vytorin 10-80 Mg  Tabs (Ezetimibe-Simvastatin) .... Take 1/2 Tab By Mouth Once Daily 7)  Advair Diskus 250-50 Mcg/dose  Misc (Fluticasone-Salmeterol) .... One Puff Twice Daily 8)  Miralax   Powd (Polyethylene Glycol 3350) .... Once Daily 9)  Diethylpropion Hcl Cr 75 Mg Xr24h-Tab (Diethylpropion Hcl) .... Take 1 Tablet By Mouth Daily As Needed 10)  Ultram 50 Mg  Tabs (Tramadol Hcl) .... Take One Tab Every Four Hours As Needed For Cough or Pain 11)  Alprazolam 0.5 Mg Tbdp (Alprazolam) .... One Every 6 Hours If Needed For Nerves or Breathessless 12)  Mucinex Dm 30-600 Mg  Tb12 (Dextromethorphan-Guaifenesin) .Marland Kitchen.. 1-2 Every 12 Hours For Congestion or Cough Every 12 Hours 13)  Afrin Nasal Spray 0.05 %  Soln (Oxymetazoline Hcl) .... As Needed 14)  Kenalog  Aers (Triamcinolone Acetonide) in Orabase .... Apply Four Times A Day  Allergies (verified): No Known Drug Allergies  Past History:  Past Medical History: Hx of COUGH (ICD-786.2) Hemoptysis/epistaxis August 31, 2008 ..............................................Marland KitchenWert     -  FOB 09/04/2008 clot lodged over rul orifice, CT chest: no  mass neg PE     - FOB 10/18/08 negative GERD (ICD-530.81)     -  Neg EGD 5/99     -  Neg 24h ph study 9/99 POLYP, COLON (ICD-211.3)............................................................Marland KitchenMarina Goodell      - Colonoscopy 07/07/03       -  Recall letter reviewed  with pt from 07/06/08 on July 12, 2008  RHINITIS, CHRONIC (ICD-472.0) HYPERLIPIDEMIA (ICD-272.4)     - Target LDL < 130 (pos fm hx, pos smoking hx) HEALTH MAINTENANCE..................................................................Marland KitchenWert      -  dt  2/06      -  pneumovax 2/07      -  DEXA nl 08/18/06      -  CPX   July 12, 2008     Vital Signs:  Patient profile:   55 year old female Weight:      136 pounds O2 Sat:      98 % on Room air Temp:     97.7 degrees F oral Pulse rate:   88 / minute BP sitting:   118 / 78  (left arm)  Vitals Entered By: Vernie Murders (January 16, 2009 9:17 AM)  O2 Flow:  Room air  Physical Exam  Additional Exam:  Amb depressed/anxious  wf NAD wt  129 Sep 22, 2008 > 132 November 15, 2008 > 136 January 16, 2009  HEENT: nl dentition, turbinates,  sev aphthos ulcers left buchal mucosa. Nl external ear canals without cough reflex, NM pale  w/ clear d/ch,    Neck without JVD/Nodes/TM Lungs CTA no wheezing or crackles. RRR no s3 or murmur or increase in P2. No edema Abd soft and benign with nl excursion in the supine position. No bruits or organomegaly Ext without clubbing, cyanosis, calf tenderness Cholesterol          [H]  206 mg/dL                   1-610     ATP III Classification            Desirable:  < 200 mg/dL                    Borderline High:  200 - 239 mg/dL               High:  > = 240 mg/dL   Triglycerides        [H]  181.0 mg/dL                 9.6-045.4     Normal:  <150 mg/dL     Borderline High:  098 - 199 mg/dL   HDL                       11.91 mg/dL                 >47.82   VLDL Cholesterol          36.2 mg/dL                  9.5-62.1  CHO/HDL Ratio:  CHD Risk                             4                    Men  Women     1/2 Average Risk     3.4          3.3     Average Risk          5.0          4.4     2X Average Risk          9.6          7.1     3X Average Risk          15.0          11.0                           Tests: (2) Hepatic/Liver Function Panel (HEPATIC)   Total Bilirubin           0.9 mg/dL                   1.6-1.0   Direct Bilirubin          0.0 mg/dL                   9.6-0.4   Alkaline Phosphatase      72 U/L                      39-117   AST                       22 U/L                      0-37   ALT                        19 U/L                      0-35   Total Protein             6.8 g/dL                    5.4-0.9   Albumin                   4.1 g/dL                    8.1-1.9  Tests: (3) Full Range CRP (FCRP)   Full Range CRP       [H]  5.90 mg/L                   0.00-5.00     Note:  An elevated hs-CRP (>5 mg/L) should be repeated after 2 weeks to rule out recent infection or trauma.  Tests: (4) Cholesterol LDL - Direct (DIRLDL)  Cholesterol LDL - Direct                             132.7 mg/dL  CXR  Procedure date:  01/16/2009  Findings:      Findings: There are chronic emphysematous changes with scarring. No sign of active infiltrate, mass, effusion or collapse.  Ordinary degenerative changes effect the spine.   IMPRESSION: Emphysema and pulmonary scarring.  No active process eviden  Impression & Recommendations:  Problem # 1:  HYPERLIPIDEMIA (ICD-272.4)  Her updated medication list for this problem includes:    Vytorin 10-80  Mg Tabs (Ezetimibe-simvastatin) ..... One daily at bedtime  Labs Reviewed: SGOT: 20 (07/12/2008)   SGPT: 23 (07/12/2008)   HDL:53.8 (07/12/2008), 50.6 (06/25/2007)  LDL:DEL (07/12/2008), 103 (06/25/2007)  Chol:206 (07/12/2008), 175 (06/25/2007)  Trig:100 (07/12/2008), 108 (06/25/2007)  LDL 128 > 132 with target < 130 due to fm hx, smoking hx > work harder on complete smoking abstinence  Problem # 2:  HEMOPTYSIS (ICD-786.3)  Her updated medication list for this problem includes:    Multivitamins Tabs (Multiple vitamin) .Marland Kitchen... Take 1 tablet by mouth once a day    Vitamin C 500 Mg Tabs (Ascorbic acid) .Marland Kitchen... Take 1 tablet by mouth once a day    Calcium 500 500 Mg Tabs (Calcium carbonate) .Marland Kitchen... 2 daily    Advair Diskus 250-50 Mcg/dose Misc (Fluticasone-salmeterol) ..... One puff twice daily  Cxr reviewed, no futher cxr's needed   I took this opportunity to educate the patient regarding the consequences of smoking in airway disorders based on all the  data we have from the multiple national lung health studies indicating that smoking cessation, not choice of inhalers or physicians, is the most important aspect of care.    Problem # 3:  RHINITIS, CHRONIC (ICD-472.0)  I emphasized that nasal steroids have no immediate benefit in terms of improving symptoms.  To help them reached the target tissue, the patient should use Afrin two puffs every 12 hours applied one min before using the nasal steroids.  Afrin should be stopped after no more than 5 days.  If the symptoms worsen, Afrin can be restarted after 5 days off of therapy to prevent rebound congestion from overuse of Afrin.  I also emphasized that in no way are nasal steroids a concern in terms of "addiction".  Problem # 4:  SLEEP DISORDER UNSPECIFIED (ICD-780.50) Poor sleep hygiene discussed   Each maintenance medication was reviewed in detail including most importantly the difference between maintenance and as needed and under what circumstances the prns are to be used. This was done in the context of a medication calendar review which provided the patient with a user-friendly unambiguous mechanism for medication administration and reconciliation and provides an action plan for all active problems. It is critical that this be shown to every doctor  for modification during the office visit if necessary so the patient can use it as a working document. See instructions for specific recommendations   Medications Added to Medication List This Visit: 1)  Vytorin 10-80 Mg Tabs (Ezetimibe-simvastatin) .... One daily at bedtime 2)  Miralax Powd (Polyethylene glycol 3350) .... Once daily 3)  Alprazolam 0.5 Mg Tbdp (Alprazolam) .... One every 6 hours if needed for nerves or breathessless  Complete Medication List: 1)  Multivitamins Tabs (Multiple vitamin) .... Take 1 tablet by mouth once a day 2)  Vitamin C 500 Mg Tabs (Ascorbic acid) .... Take 1 tablet by mouth once a day 3)  Omeprazole 20 Mg Cpdr  (Omeprazole) .... Take  one 30-60 min before first meal of the day 4)  Calcium 500 500 Mg Tabs (Calcium carbonate) .... 2 daily 5)  Vivelle-dot 0.1 Mg/24hr Pttw (Estradiol) .... Use as directed each monday and friday 6)  Vytorin 10-80 Mg Tabs (Ezetimibe-simvastatin) .... One daily at bedtime 7)  Advair Diskus 250-50 Mcg/dose Misc (Fluticasone-salmeterol) .... One puff twice daily 8)  Miralax Powd (Polyethylene glycol 3350) .... Once daily 9)  Diethylpropion Hcl Cr 75 Mg Xr24h-tab (Diethylpropion hcl) .... Take 1 tablet by mouth daily as needed 10)  Ultram 50  Mg Tabs (Tramadol hcl) .... Take one tab every four hours as needed for cough or pain 11)  Alprazolam 0.5 Mg Tbdp (Alprazolam) .... One every 6 hours if needed for nerves or breathessless 12)  Mucinex Dm 30-600 Mg Tb12 (Dextromethorphan-guaifenesin) .Marland Kitchen.. 1-2 every 12 hours for congestion or cough every 12 hours 13)  Afrin Nasal Spray 0.05 % Soln (Oxymetazoline hcl) .... As needed 14)  Kenalog Aers (triamcinolone Acetonide) in Orabase  .... Apply four times a day  Other Orders: Est. Patient Level IV (13086) TLB-Lipid Panel (80061-LIPID) TLB-Hepatic/Liver Function Pnl (80076-HEPATIC) TLB-CRP-Full Range (C-Reactive Protein) (86140-FCRP) T-2 View CXR (71020TC)  Patient Instructions: 1)  Sleep hygiene:  no caffeine after bfast, dim reading lights for at least 30 min before bed. 2)  See calendar for specific medication instructions and bring it back for each and every office visit for every healthcare provider you see.  Without it,  you may not receive the best quality medical care that we feel you deserve.  3)  Return to office in 3 months, sooner if needed  Prescriptions: MIRALAX   POWD (POLYETHYLENE GLYCOL 3350) once daily  #30 x 11   Entered and Authorized by:   Nyoka Cowden MD   Signed by:   Nyoka Cowden MD on 01/16/2009   Method used:   Print then Give to Patient   RxID:   [id number] ALPRAZOLAM 0.5 MG TBDP (ALPRAZOLAM) one  every 6 hours if needed for nerves or breathessless  #90 x 0   Entered and Authorized by:   Nyoka Cowden MD   Signed by:   Nyoka Cowden MD on 01/16/2009   Method used:   Print then Give to Patient   RxID:   [id number] VYTORIN 10-80 MG  TABS (EZETIMIBE-SIMVASTATIN) one daily at bedtime  #34 x 11   Entered and Authorized by:   Nyoka Cowden MD   Signed by:   Nyoka Cowden MD on 01/16/2009   Method used:   Electronically to        [facility]  [address] [phone] (retail)       [address]       [place], [place]  [address]       Ph: [phone]       Fax: [phone]   RxID:   [id number]

## 2010-06-04 NOTE — Assessment & Plan Note (Signed)
Summary: cpx/fasting/apc   Visit Type:  CPX PCP:  [name]  Chief Complaint:  CPX fasting.Marland Kitchen  History of Present Illness: 55 year old white female former smoker with a history of hyperlipidemia with a greater than one-year history of numbness in the feet greater than the hands which she previously denied had a definite pattern but today tells me he only comes on in cold weather it is gone in warm weather.  She denies any TIA or claudication symptoms orthopnea PND exertional chest pain fevers chills or sweats he does not have a history of any diabetes.   Current Meds:  BAYER LOW STRENGTH 81 MG  TBEC (ASPIRIN) take one tab by mouth once daily OMEPRAZOLE 20 MG  CPDR (OMEPRAZOLE) take one tab by mouth once daily VIVELLE-DOT 0.1 MG/24HR  PTTW (ESTRADIOL) use as directed each monday and friday VYTORIN 10-80 MG  TABS (EZETIMIBE-SIMVASTATIN) take 1/2 tab by mouth once daily DIETHYLPROPION HCL CR 75 MG  TB24 (DIETHYLPROPION HCL) take one tab by mouth once daily as needed FLUOCINONIDE 0.05 %  CREA (FLUOCINONIDE) apply as needed ULTRAM 50 MG  TABS (TRAMADOL HCL) take one tab every four hours as needed AFRIN NASAL SPRAY 0.05 %  SOLN (OXYMETAZOLINE HCL) as needed CALCIUM 500 500 MG  TABS (CALCIUM CARBONATE) as directed MULTIVITAMINS   TABS (MULTIPLE VITAMIN) Take 1 tablet by mouth once a day MUCINEX DM 30-600 MG  TB12 (DEXTROMETHORPHAN-GUAIFENESIN) every 12 hours as needed ADVIL COLD/SINUS 30-200 MG  TABS (PSEUDOEPHEDRINE-IBUPROFEN) as needed       Current Allergies: No known allergies   Past Medical History:    Reviewed history from 06/02/2007 and no changes required:              Hx of COUGH (ICD-786.2)       GERD (ICD-530.81)       POLYP, COLON (ICD-211.3)       RHINITIS, CHRONIC (ICD-472.0)       HYPERLIPIDEMIA (ICD-272.4)          Past Surgical History:    sp bladder prolapse repair 11/08   Family History:    Reviewed history and no changes required:       Coronary Heart Disease  female < 91 tather       Osteoporsis and emphysema, mother  Social History:    Reviewed history and no changes required:       quit smoking march 01    Review of Systems  The patient denies anorexia, fever, weight loss, weight gain, vision loss, decreased hearing, hoarseness, chest pain, syncope, dyspnea on exhertion, peripheral edema, prolonged cough, hemoptysis, melena, hematochezia, severe indigestion/heartburn, hematuria, incontinence, muscle weakness, suspicious skin lesions, transient blindness, difficulty walking, depression, unusual weight change, abnormal bleeding, enlarged lymph nodes, angioedema, and breast masses.         new llq abd pain and constipation x 2 weeks, constipation resolved with miralax, pain feels like gas and worse since added miralax per gyn   Vital Signs:  Patient Profile:   55 Years Old Female Height:     61.4 inches Weight:      138 pounds O2 Sat:      100 % O2 treatment:    Room Air Temp:     98.0 degrees F oral Pulse rate:   80 / minute BP sitting:   116 / 74  (left arm)  Vitals Entered By: Vernie Murders (June 25, 2007 8:39 AM)  Physical Exam    Ambulatory healthy appearing in no acute distress. Afeb with normal vital signs HEENT: nl dentition, turbinates, and orophanx. Nl external ear canals without cough reflex Neck without JVD/Nodes/TM Lungs clear to A and P bilaterally without cough on insp or exp maneuvers RRR no s3 or murmur or increase in P2 Abd soft and benign with nl excursion in the supine position. No bruits or organomegaly Ext warm without calf tenderness, cyanosis clubbing or edema her pedal pulses are distant but present.  Her feet are cool.  There are no significant skin changes to suggest arterial insufficiency. Skin warm and dry without lesions  no evidence of sclerodactyly neurologic exam is significant for the absence of focal sensory motor or cerebellar deficits and pathologic reflexes.  Her  sensorium was normal. Genitourinary rectal per GYN     Impression & Recommendations:  Problem # 1:  HYPERLIPIDEMIA (ICD-272.4) Target <130 because smoker with pos fm hx  she is at target on present regimen which I did not change.  Her updated medication list for this problem includes:    Vytorin 10-80 Mg Tabs (Ezetimibe-simvastatin) .Marland Kitchen... Take 1/2 tab by mouth once daily  Orders: TLB-Lipid Panel (80061-LIPID) TLB-BMP (Basic Metabolic Panel-BMET) (80048-METABOL) TLB-CBC Platelet - w/Differential (85025-CBCD) TLB-Hepatic/Liver Function Pnl (80076-HEPATIC) TLB-TSH (Thyroid Stimulating Hormone) (84443-TSH) Est. Patient Level V (16109)   Problem # 2:  PARESTHESIA (ICD-782.0) only occurs in cold weather with no classic raynaud's pattern on rewarming. I believe this is constitutional and gave her advice regarding warming maneuvers for her feet in the winter.    Problem # 3:  RHINITIS, CHRONIC (ICD-472.0)  Each maintenance medication was reviewed in detail including most importantly the difference between maintenance and as needed and under what circumstances the prns are to be used.   I emphasized that nasal steroids have no immediate benefit in terms of improving symptoms.  To help them reached the target tissue, the patient should use Afrin two puffs every 12 hours applied one min before using the nasal steroids.  Afrin should be stopped after no more than 5 days.  If the symptoms worsen, Afrin can be restarted after 5 days off of therapy to prevent rebound congestion from overuse of Afrin.  I also emphasized that in no way are nasal steroids a concern in terms of "addiction".     Problem # 4:  POLYP, COLON (ICD-211.3) See study 07/07/03 with diverticulosis also, for computer recall 2010  Problem # 5:  ABDOMINAL PAIN, LEFT LOWER QUADRANT (ICD-789.04) left lower quadrant abdominal pain came on in a setting of improved constipation after taking MiraLax.  MiraLax however does  contribute to abdominal gas which is another one of her new complaints and does develop subsequent to MiraLax.  I believe the MiraLax to be used p.r.n. constipation therefore and perhaps next time constipation is a problem she should use either sorbitol or stool softener.Try citrucel 1 tsp two times a day and fu with drPerry w/in two weeks if not improving  Medications Added to Medication List This Visit: 1)  Citrucel Powd (Methylcellulose (laxative)) .Marland Kitchen.. 1 tsp twice daily  Other Orders: EKG w/ Interpretation (93000)   Patient Instructions: 1)  See calendar for specific medication instructions and bring it back for each and every office visit for every healthcare provider you see.  Without it,  you may not receive the best quality medical care that we feel you deserve.  2)  See Dr [name] if needed for your abdominal discomfort 3)       ]

## 2010-06-04 NOTE — Assessment & Plan Note (Signed)
Summary: Primary svc/ new onset hemoptysis   Primary Provider/Referring Provider:  [name]  CC:  Acute visit.  Pt c/o hemoptysis- onset was 4/28.  She states that blood is bright red and sometimes coughs up enough to fill up toilet.  She also has some chest tightness and numb feeling in her left arm.  Went to ER at [facility] last night- had labs and cxr but not txed with anything.Marland Kitchen  History of Present Illness: 9 yowf active smoker with h/o hyperlipidemia.  5/09--with a greater than one-year history of numbness in the feet greater than the hands which she previously denied had a definite pattern but then later revealed  only comes on in cold weather it is gone in warm weather.    July 12, 2008 cpx c/o gradual worsening numbness, aggravated by standing a long time or walking, sym and equal bilateral.  referred to neuro  August 31, 2008 new onset hempotysis assoc with epistaxis started without antecedent uri x 24 hours, abrupt onset,  took baby aspirin this am maybe  2 cups the last episode at 1030am,  no pain with deep breath no sob. Reports  started back smoking one month prior to ov with a little sinus congestion,  and no sign ocugh.  Seen in ER  no rx rendered.    Pt denies any significant sore throat, nasal congestion or excess secretions, fever, chills, sweats, unintended wt loss, pleuritic or exertional cp, orthopnea pnd or leg swelling.  Pt also denies any obvious fluctuation in symptoms with weather or environmental change or other alleviating or aggravating factors.        Current Medications (verified): 1)  Bayer Low Strength 81 Mg  Tbec (Aspirin) .... Take One Tab By Mouth Once Daily 2)  Omeprazole 20 Mg  Cpdr (Omeprazole) .... Take One Tab By Mouth Once Daily 3)  Vivelle-Dot 0.1 Mg/24hr  Pttw (Estradiol) .... Use As Directed Each Monday and Friday 4)  Vytorin 10-80 Mg  Tabs (Ezetimibe-Simvastatin) .... Take 1/2 Tab By Mouth Once Daily 5)  Diethylpropion Hcl Cr 75 Mg  Tb24 (Diethylpropion  Hcl) .... Take One Tab By Mouth Once Daily As Needed 6)  Miralax   Powd (Polyethylene Glycol 3350) .... Once Daily 7)  Ultram 50 Mg  Tabs (Tramadol Hcl) .... Take One Tab Every Four Hours As Needed 8)  Afrin Nasal Spray 0.05 %  Soln (Oxymetazoline Hcl) .... As Needed 9)  Calcium 500 500 Mg  Tabs (Calcium Carbonate) .... As Directed 10)  Multivitamins   Tabs (Multiple Vitamin) .... Take 1 Tablet By Mouth Once A Day 11)  Mucinex Dm 30-600 Mg  Tb12 (Dextromethorphan-Guaifenesin) .... Every 12 Hours As Needed 12)  Advil Cold/sinus 30-200 Mg  Tabs (Pseudoephedrine-Ibuprofen) .... As Needed 13)  Vitamin C 500 Mg  Tabs (Ascorbic Acid) .... Take 1 Tablet By Mouth Once A Day 14)  Flonase 50 Mcg/act Susp (Fluticasone Propionate) .Marland Kitchen.. 1 To 2 Sprays Each Nostril Once Daily As Needed  Allergies (verified): No Known Drug Allergies  Past History:  Past Medical History:    Hx of COUGH (ICD-786.2)    Hemoptysis/epistaxis August 31, 2008 .............................................Marland KitchenWert    GERD (ICD-530.81)        -  Neg EGD 5/99        -  Neg 24h ph study 9/99    POLYP, COLON (ICD-211.3)............................................................Marland KitchenMarina Goodell         - Colonoscopy 07/07/03          -  Recall letter reviewed  with pt from 07/06/08 on July 12, 2008     RHINITIS, CHRONIC (ICD-472.0)    HYPERLIPIDEMIA (ICD-272.4)        - Target LDL < 130 (pos fm hx, pos smoking hx)    HEALTH MAINTENANCE..................................................................Marland KitchenWert         -  dt  2/06         -  pneumovax 2/07         -  DEXA nl 08/18/06         -  CPX  July 12, 2008        Family History:    Coronary Heart Disease female < 98 tather    Osteoporsis and emphysema, mother    neg lung ca  Social History:    Started smoking again in March 2010- 5-6 cigs per day.  Vital Signs:  Patient profile:   55 year old female Weight:      130 pounds Temp:     97.9 degrees F oral Pulse rate:   85 / minute BP  sitting:   118 / 72  (left arm)  Vitals Entered By: Vernie Murders (August 31, 2008 11:37 AM)  O2 Sat on room air at rest %:  96  Physical Exam  Additional Exam:  Amb depressed wf NAD, no active hemoptysis wt 132 > 130  07/12/2008 > 130 August 31, 2008  HEENT: nl dentition, turbinates, and orophanx. Nl external ear canals without cough reflex, NM red/swollen w/ clear d/ch, max tenderness.  Neck without JVD/Nodes/TM Lungs clear to A and P bilaterally without cough on insp or exp maneuvers RRR no s3 or murmur or increase in P2. No edema Abd soft and benign with nl excursion in the supine position. No bruits or organomegaly Ext warm without calf tenderness, cyanosis clubbing  Skin warm and dry without lesions .    CXR  Procedure date:  08/31/2008  Findings:      minimal cb changes  Impression & Recommendations:  Problem # 1:  HEMOPTYSIS (ICD-786.3) cxr clear and assoc with epistaxis in pt on asa/ advil cold and sinus and flonase so rx as rhinitis/bronchitis . See instructions for specific recommendations    Her updated medication list for this problem includes:    Calcium 500 500 Mg Tabs (Calcium carbonate) .Marland Kitchen... As directed    Multivitamins Tabs (Multiple vitamin) .Marland Kitchen... Take 1 tablet by mouth once a day    Vitamin C 500 Mg Tabs (Ascorbic acid) .Marland Kitchen... Take 1 tablet by mouth once a day    Doxycycline Monohydrate 100 Mg Caps (Doxycycline monohydrate) ..... One twice daily    Prednisone 10 Mg Tabs (Prednisone) .Marland KitchenMarland KitchenMarland KitchenMarland Kitchen 4 each am x 2days, 2x2days, 1x2days and stop  Problem # 2:  SMOKER (ICD-305.1)  I took this opportunity to re-enlighten  the patient regarding the consequences of smoking in airway disorders based on all the data we have from the multiple national lung health studies indicating that smoking cessation, not choice of inhalers or physicians, is the most important aspect of care in terms of the natural hx of lung complications from smoking. ok to try nicorette otc   Each  maintenance medication was reviewed in detail including most importantly the difference between maintenance and as needed and under what circumstances the prns are to be used. See instructions for specific recommendations   Orders: Est. Patient Level IV (04540)  Medications Added to Medication List This Visit: 1)  Flonase 50 Mcg/act Susp (Fluticasone propionate) .Marland Kitchen.. 1 to 2  sprays each nostril once daily as needed 2)  Doxycycline Monohydrate 100 Mg Caps (Doxycycline monohydrate) .... One twice daily 3)  Prednisone 10 Mg Tabs (Prednisone) .... 4 each am x 2days, 2x2days, 1x2days and stop 4)  Alprazolam 0.5 Mg Tbdp (Alprazolam) .... One every 6 hours if needed  Other Orders: T-1 View CXR, Same Day (71010.6TC)  Patient Instructions: 1)  mucinex dm 2 every 12 hours and add tramadol 50 mg up to 2 every 4 hours to suppress the urge to cough. Swallowing water or using ice chips/non mint and menthol containing candies (such as lifesavers or sugarless jolly ranchers) are also effective.  2)  Doxycyclinine 100mg  twice daily x 10 days 3)  Prednisone 10 4 each am x 2days, 2x2days, 1x2days and stop  4)  Bedrest for 72 hours and no smoking  5)  no aspirin or flonase or advil cold and sinus 6)  no smoking 7)  if condition worsens significantly on this regimen to er Prescriptions: ALPRAZOLAM 0.5 MG TBDP (ALPRAZOLAM) one every 6 hours if needed  #30 x 0   Entered and Authorized by:   Nyoka Cowden MD   Signed by:   Nyoka Cowden MD on 08/31/2008   Method used:   Print then Give to Patient   RxID:   [id number] PREDNISONE 10 MG  TABS (PREDNISONE) 4 each am x 2days, 2x2days, 1x2days and stop  #14 x 0   Entered and Authorized by:   Nyoka Cowden MD   Signed by:   Nyoka Cowden MD on 08/31/2008   Method used:   [facility]  [address] [id number]       [address]       [place], [place]  [address]       Ph: [phone] or [phone]       Fax:  [phone]   RxID:   [id number] DOXYCYCLINE MONOHYDRATE 100 MG  CAPS (DOXYCYCLINE MONOHYDRATE) one twice daily  #20 x 0   Entered and Authorized by:   Nyoka Cowden MD   Signed by:   Nyoka Cowden MD on 08/31/2008   Method used:   [facility]        [facility]  [address] [phone][facility] (retail[facility]       [address]       [place], [place]  [address]       Ph: [phone] or [phone]       Fax: [phone]   RxID:   [id number]

## 2010-06-04 NOTE — Assessment & Plan Note (Signed)
Summary: FLU VACCINE///kp  Nurse Visit   Allergies: No Known Drug Allergies  Orders Added: 1)  Admin 1st Vaccine [90471] 2)  Flu Vaccine 33yrs + [45409]  Flu Vaccine Consent Questions     Do you have a history of severe allergic reactions to this vaccine? no    Any prior history of allergic reactions to egg and/or gelatin? no    Do you have a sensitivity to the preservative Thimersol? no    Do you have a past history of Guillan-Barre Syndrome? no    Do you currently have an acute febrile illness? no    Have you ever had a severe reaction to latex? no    Vaccine information given and explained to patient? yes    Are you currently pregnant? no    Lot Number:[id number]   Exp Date:11/01/2009   Site Given  Right Deltoid IM Tammy Scott  February 13, 2009 4:33 PM

## 2010-06-04 NOTE — Miscellaneous (Signed)
Summary: Orders Update pft charges  Clinical Lists Changes  Orders: Added new Service order of Carbon Monoxide diffusing w/capacity (94720) - Signed Added new Service order of Lung Volumes (94240) - Signed Added new Service order of Spirometry (Pre & Post) (94060) - Signed 

## 2010-06-04 NOTE — Assessment & Plan Note (Signed)
Summary: Acute NP office visit    Primary Provider/Referring Provider:  [name]  CC:  sinus pressure on right side and feels drianage in right ear x1week - denies f/c/s.  History of Present Illness: 55 yowf active smoker with h/o hyperlipidemia and chronic bronchitis   August 31, 2008 new onset hempotysis assoc with epistaxis started without antecedent uri x 24 hours, abrupt onset,    rx as aecopd .  Sep 02, 2008  to ED with recurrent hemoptysis.  At least 2 cups of blood bright red. admit thru May 4 with FOB 5/3 clot over rul  with neg cytology.  Sep 07, 2008 ov cc osb, mild sense of chest congestion, minimal old blood coughed up x 24 hours.   Sep 14, 2008--ov  follow up and med review. Last visit Advair 250/50 added and prilosec increased two times a day. Over last week, less coughing, w/ less old blood mixed in clear mucous. Starting to feel better, w/ improved energy.  10/18/08 neg fob  November 15, 2008 ov smoking 1-2 per day vs 10 per day.   May 08, 2009 Acute visit.  Pt c/o trouble sleeping x 2 months.  Pt states that she takes xanax to help her sleep and this helps her fall asleep but she does not stay asleep.  She wakes up about every hour.  Pt also started smoking again- 5 cigs per day. no more hemoptysis.  lots of stress re family/work.  February 23, 57--Presents for an acute office visit. Complains of  sinus pressure on right side, feels drianage in right ear x1week . Has some cough and congestion .OTC Not helping. Denies chest pain, orthopnea, hemoptysis, fever, n/v/d, edema, headache. She is still smoking.    Preventive Screening-Counseling & Management  Alcohol-Tobacco     Smoking Status: current     Packs/Day: 3-4cigs A DAY  Medications Prior to Update: 1)  Multivitamins   Tabs (Multiple Vitamin) .... Take 1 Tablet By Mouth Once A Day 2)  Vitamin C 500 Mg  Tabs (Ascorbic Acid) .... Take 1 Tablet By Mouth Once A Day 3)  Omeprazole 20 Mg  Cpdr (Omeprazole) .... Take  One  30-60 Min Before First Meal of The Day 4)  Calcium 500 500 Mg  Tabs (Calcium Carbonate) .... 2 Daily 5)  Vivelle-Dot 0.1 Mg/24hr  Pttw (Estradiol) .... Use As Directed Each Monday and Friday 6)  Vytorin 10-80 Mg  Tabs (Ezetimibe-Simvastatin) .... 1/2 At Bedtime 7)  Advair Diskus 250-50 Mcg/dose  Misc (Fluticasone-Salmeterol) .... One Puff Once Daily 8)  Miralax   Powd (Polyethylene Glycol 3350) .... Once Daily 9)  Diethylpropion Hcl Cr 75 Mg Xr24h-Tab (Diethylpropion Hcl) .... Take 1 Tablet By Mouth Daily As Needed 10)  Ultram 50 Mg  Tabs (Tramadol Hcl) .... Take One Tab Every Four Hours As Needed For Cough or Pain 11)  Alprazolam 0.5 Mg Tbdp (Alprazolam) .... One Every 6 Hours If Needed For Nerves or Breathessless 12)  Mucinex Dm 30-600 Mg  Tb12 (Dextromethorphan-Guaifenesin) .Marland Kitchen.. 1-2 Every 12 Hours For Congestion or Cough Every 12 Hours 13)  Afrin Nasal Spray 0.05 %  Soln (Oxymetazoline Hcl) .... As Needed 14)  Kenalog  Aers (Triamcinolone Acetonide) in Orabase .... Apply Four Times A Day 15)  Valtrex 500 Mg Tabs (Valacyclovir Hcl) .... 1/2 Once Daily 16)  Striant 30 Mg Misc (Testosterone) .... As Directed 3 Times A Wk  Current Medications (verified): 1)  Multivitamins   Tabs (Multiple Vitamin) .... Take  1 Tablet By Mouth Once A Day 2)  Vitamin C 500 Mg  Tabs (Ascorbic Acid) .... Take 1 Tablet By Mouth Once A Day 3)  Omeprazole 20 Mg  Cpdr (Omeprazole) .... Take  One 30-60 Min Before First Meal of The Day 4)  Calcium 500 500 Mg  Tabs (Calcium Carbonate) .... 2 Daily 5)  Vivelle-Dot 0.1 Mg/24hr  Pttw (Estradiol) .... Use As Directed Each Monday and Friday 6)  Vytorin 10-80 Mg  Tabs (Ezetimibe-Simvastatin) .... 1/2 At Bedtime 7)  Advair Diskus 250-50 Mcg/dose  Misc (Fluticasone-Salmeterol) .... One Puff Once Daily 8)  Miralax   Powd (Polyethylene Glycol 3350) .... Once Daily 9)  Diethylpropion Hcl Cr 75 Mg Xr24h-Tab (Diethylpropion Hcl) .... Take 1 Tablet By Mouth Daily As Needed 10)  Ultram  50 Mg  Tabs (Tramadol Hcl) .... Take One Tab Every Four Hours As Needed For Cough or Pain 11)  Alprazolam 0.5 Mg Tbdp (Alprazolam) .... One Every 6 Hours If Needed For Nerves or Breathessless 12)  Mucinex Dm 30-600 Mg  Tb12 (Dextromethorphan-Guaifenesin) .Marland Kitchen.. 1-2 Every 12 Hours For Congestion or Cough Every 12 Hours 13)  Afrin Nasal Spray 0.05 %  Soln (Oxymetazoline Hcl) .... As Needed 14)  Kenalog  Aers (Triamcinolone Acetonide) in Orabase .... Apply Four Times A Day 15)  Valtrex 500 Mg Tabs (Valacyclovir Hcl) .... 1/2 Once Daily 16)  Striant 30 Mg Misc (Testosterone) .... As Directed 3 Times A Wk  Allergies (verified): No Known Drug Allergies  Past History:  Past Medical History: Last updated: 57/08/2009 Hx of COUGH (ICD-786.2) Hemoptysis/epistaxis August 31, 2008 ..............................................Marland KitchenWert     - FOB 09/04/2008 clot lodged over rul orifice, CT chest: no  mass neg PE     - FOB 10/18/08 negative GERD (ICD-530.81)     -  Neg EGD 5/99     -  Neg 24h ph study 9/99 POLYP, COLON (ICD-211.3)............................................................Marland KitchenMarina Goodell      - Colonoscopy 07/07/03       - Colonoscopy 10/31/08 :  1) Adenomatous Polyps, multiple (6) removed,  Severe diverticulosis  RHINITIS, CHRONIC (ICD-472.0) HYPERLIPIDEMIA (ICD-272.4)     - Target LDL < 130 (pos fm hx, pos smoking hx) HEALTH MAINTENANCE..................................................................Marland KitchenWert      -  dt  2/06      -  pneumovax 2/07      -  DEXA nl 08/18/06      -  CPX  July 12, 2008     Past Surgical History: Last updated: 07/12/2008 sp bladder prolapse repair 11/08 vag hysterectomy 1989  Family History: Last updated: 08/31/2008 Coronary Heart Disease female < 55 tather Osteoporsis and emphysema, mother neg lung ca  Social History: Last updated: 06/27/2009 Started smoking again in March 2010- 3-4 cigs per day. occ alcohol married 2 children unemployed  Risk  Factors: Smoking Status: current (06/27/2009) Packs/Day: 3-4cigs A DAY (06/27/2009)  Social History: Started smoking again in March 2010- 3-4 cigs per day. occ alcohol married 2 children unemployed Smoking Status:  current Packs/Day:  3-4cigs A DAY  Review of Systems      See HPI  Vital Signs:  Patient profile:   55 year old female Height:      61.5 inches Weight:      138 pounds BMI:     25.75 O2 Sat:      100 % on Room air Temp:     97.5 degrees F oral Pulse rate:   73 / minute BP sitting:   108 / 70  (  left arm) Cuff size:   regular  Vitals Entered By: [name] CNA (June 27, 2009 11:51 AM)  O2 Flow:  Room air CC: sinus pressure on right side, feels drianage in right ear x1week - denies f/c/s Is Patient Diabetic? No Comments Medications reviewed with patient Daytime contact number verified with patient. [name] CNA  June 27, 2009 11:51 AM    Physical Exam  Additional Exam:  Amb   wf NAD wt  129 Sep 22, 2008 > 132 November 15, 2008 > 136 January 16, 2009 > 136 May 08, 2009 HEENT: nl dentition, turbinates,  external ear canals without cough reflex, NM pale  w/ clear d/ch,    Neck without JVD/Nodes/TM Lungs CTA no wheezing or crackles. RRR no s3 or murmur or increase in P2. No edema Abd soft and benign with nl excursion in the supine position. No bruits or organomegaly Ext without clubbing, cyanosis, calf tenderness   Impression & Recommendations:  Problem # 1:  BRONCHITIS, ACUTE (ICD-466.0)  Ommicef 300mg  two times a day for 7 days Mucinex DM two times a day as needed congestion  Zyrtec 10mg  at bedtime for 5 days  Advil cold and sinus as needed for sinus pain/pressure Saline nasal rinses.  Please contact office for sooner follow up if symptoms do not improve or worsen  STOP SMOKING.   Her updated medication list for this problem includes:    Advair Diskus 250-50 Mcg/dose Misc (Fluticasone-salmeterol) ..... One puff once daily     Mucinex Dm 30-600 Mg Tb12 (Dextromethorphan-guaifenesin) .Marland Kitchen... 1-2 every 12 hours for congestion or cough every 12 hours    Cefdinir 300 Mg Caps (Cefdinir) .Marland Kitchen... 1 by mouth two times a day  Take antibiotics and other medications as directed. Encouraged to push clear liquids, get enough rest, and take acetaminophen as needed. To be seen in 5-7 days if no improvement, sooner if worse.  Orders: Est. Patient Level III ([address]109)  Medications Added to Medication List This Visit: 1)  Cefdinir 300 Mg Caps (Cefdinir) .Marland Kitchen.. 1 by mouth two times a day  Complete Medication List: 1)  Multivitamins Tabs (Multiple vitamin) .... Take 1 tablet by mouth once a day 2)  Vitamin C 500 Mg Tabs (Ascorbic acid) .... Take 1 tablet by mouth once a day 3)  Omeprazole 20 Mg Cpdr (Omeprazole) .... Take  one 30-60 min before first meal of the day 4)  Calcium 500 500 Mg Tabs (Calcium carbonate) .... 2 daily 5)  Vivelle-dot 0.1 Mg/24hr Pttw (Estradiol) .... Use as directed each monday and friday 6)  Vytorin 10-80 Mg Tabs (Ezetimibe-simvastatin) .... 1/2 at bedtime 7)  Advair Diskus 250-50 Mcg/dose Misc (Fluticasone-salmeterol) .... One puff once daily 8)  Miralax Powd (Polyethylene glycol 3350) .... Once daily 9)  Diethylpropion Hcl Cr 75 Mg Xr24h-tab (Diethylpropion hcl) .... Take 1 tablet by mouth daily as needed 10)  Ultram 50 Mg Tabs (Tramadol hcl) .... Take one tab every four hours as needed for cough or pain 11)  Alprazolam 0.5 Mg Tbdp (Alprazolam) .... One every 6 hours if needed for nerves or breathessless 12)  Mucinex Dm 30-600 Mg Tb12 (Dextromethorphan-guaifenesin) .Marland Kitchen.. 1-2 every 12 hours for congestion or cough every 12 hours 13)  Afrin Nasal Spray 0.05 % Soln (Oxymetazoline hcl) .... As needed 14)  Kenalog Aers (triamcinolone Acetonide) in Orabase  .... Apply four times a day 15)  Valtrex 500 Mg Tabs (Valacyclovir hcl) .... 1/2 once daily 16)  Striant 30 Mg Misc (Testosterone) .Marland KitchenMarland KitchenMarland Kitchen  As directed 3 times a wk 17)   Cefdinir 300 Mg Caps (Cefdinir) .Marland Kitchen.. 1 by mouth two times a day  Patient Instructions: 1)  Ommicef 300mg  two times a day for 7 days 2)  Mucinex DM two times a day as needed congestion  3)  Zyrtec 10mg  at bedtime for 5 days  4)  Advil cold and sinus as needed for sinus pain/pressure 5)  Saline nasal rinses.  6)  Please contact office for sooner follow up if symptoms do not improve or worsen  7)  STOP SMOKING.  Prescriptions: CEFDINIR 300 MG CAPS (CEFDINIR) 1 by mouth two times a day  #14 x 0   Entered and Authorized by:   Rubye Oaks NP   Signed by:   Tammy Parrett NP on 06/27/2009   Method used:   [facility]        [facility]  [address] [id number]       [address]       [place], [place]  [address]       Ph: [phone]       Fax: [phone]   RxID:   [id number]

## 2010-06-04 NOTE — Progress Notes (Signed)
Summary: labs  Phone Note Call from Patient   Caller: Madiha Call For: dr wert Summary of Call: wants lab results called phone tree but not on there call [phone]  Initial call taken by: Oneita Jolly,  July 17, 2008 10:46 AM  Follow-up for Phone Call        Please advise on results.  Phone tree says that results are not available. Follow-up by: Abigail Miyamoto RN,  July 17, 2008 10:56 AM  Additional Follow-up for Phone Call Additional follow up Details #1::        phone tree redone today - let her know  Additional Follow-up by: Nyoka Cowden MD,  July 17, 2008 3:27 PM    Additional Follow-up for Phone Call Additional follow up Details #2::    called patient, informed her that Dr. [name] has redone her phone tree message that i checked it just to make sure.  pt is aware. Follow-up by: [name] CNA,  July 17, 2008 3:36 PM

## 2010-06-04 NOTE — Progress Notes (Signed)
Summary: RX REQ  Phone Note Call from Patient   Caller: Patient Call For: [name] Summary of Call: SINIS PRESSURE ON R. SIDE OF FACE. RUNNY NOSE. PT HAS AN APPT PENDING (PHYSICAL) NEXT MONTH. CAN'T COME IN DUE TO WORK SCHEDULE. [facility] [address] PT WORK # [id number]. PATIENT'S CHART HAS BEEN REQUESTED Initial call taken by: Tivis Ringer,  May 31, 2007 10:30 AM  Follow-up for Phone Call        [facility] TCB .................................................................Marland KitchenMarland KitchenVernie Murders  May 31, 2007 2:57 PM  Pt to see TP tommorrow at 12 to be evaluated. .................................................................Marland KitchenMarland KitchenVernie Murders  June 01, 2007 3:48 PM

## 2010-06-04 NOTE — Assessment & Plan Note (Signed)
Summary: Primary svc/ cpx   Primary Provider/Referring Provider:  Sherene Glover  CC:  cpx fasting.  History of Present Illness: 51 yowf active smoker with h/o hyperlipidemia and chronic bronchitis   August 31, 2008 new onset hempotysis assoc with epistaxis started without antecedent uri x 24 hours, abrupt onset,    rx as aecopd .  Sep 02, 2008  to ED with recurrent hemoptysis.  At least 2 cups of blood bright red. admit thru May 4 with FOB 5/3 clot over rul  with neg cytology.  Sep 07, 2008 ov cc osb, mild sense of chest congestion, minimal old blood coughed up x 24 hours.   Sep 14, 2008--ov  follow up and med review. Last visit Advair 250/50 added and prilosec increased two times a day. Over last week, less coughing, w/ less old blood mixed in clear mucous. Starting to feel better, w/ improved energy.  10/18/08 neg fob  November 15, 2008 ov smoking 1-2 per day vs 10 per day.   May 31 , 2011 cpx   July 20, 2009 --Returns for persistent symptoms. Complains of fluid in both ears that occ causes dizziness.  pt was last seen 2-23 for sinus inf and given omnicef, but pt states ears have not improved and still has slight sinus pressure and clear dainage. She is better but feels like she hs fluid in ears, get lightheaded intermittently when turns head or leans back. no speech/visual changes. Denies chest pain, dyspnea, orthopnea, hemoptysis, fever, n/v/d, edema, headache, ext weakness.   Oct 02, 2009 cpx still smoking tried wellbutrin and chantix no benefit - nicotine gum worked the best  Current Medications (verified): 1)  Multivitamins   Tabs (Multiple Vitamin) .... Take 1 Tablet By Mouth Once A Day 2)  Vitamin C 500 Mg  Tabs (Ascorbic Acid) .... Take 1 Tablet By Mouth Once A Day 3)  Omeprazole 20 Mg  Cpdr (Omeprazole) .... Take  One 30-60 Min Before First Meal of The Day 4)  Calcium 500 500 Mg  Tabs (Calcium Carbonate) .... 2 Daily 5)  Vivelle-Dot 0.1 Mg/24hr  Pttw (Estradiol) .... Use As Directed  Each Monday and Friday 6)  Vytorin 10-80 Mg  Tabs (Ezetimibe-Simvastatin) .... 1/2 At Bedtime 7)  Advair Diskus 250-50 Mcg/dose  Misc (Fluticasone-Salmeterol) .... One Puff Once Daily 8)  Miralax   Powd (Polyethylene Glycol 3350) .... Once Daily 9)  Diethylpropion Hcl Cr 75 Mg Xr24h-Tab (Diethylpropion Hcl) .... Take 1 Tablet By Mouth Daily As Needed 10)  Ultram 50 Mg  Tabs (Tramadol Hcl) .... Take One Tab Every Four Hours As Needed For Cough or Pain 11)  Alprazolam 0.5 Mg Tbdp (Alprazolam) .... One Every 6 Hours If Needed For Nerves or Breathessless 12)  Mucinex Dm 30-600 Mg  Tb12 (Dextromethorphan-Guaifenesin) .Marland Kitchen.. 1-2 Every 12 Hours For Congestion or Cough Every 12 Hours 13)  Afrin Nasal Spray 0.05 %  Soln (Oxymetazoline Hcl) .... As Needed 14)  Kenalog  Aers (Triamcinolone Acetonide) in [place] .... Apply Four Times A Day 15)  Valtrex 500 Mg Tabs (Valacyclovir Hcl) .... 1/2 Once Daily 16)  Zyrtec Allergy 10 Mg Tabs (Cetirizine Hcl) .Marland Kitchen.. 1 Once Daily As Needed  Allergies (verified): No Known Drug Allergies  Past History:  Past Medical History:  Hemoptysis/epistaxis August 31, 2008 ..............................................Marland KitchenWert     - FOB 09/04/2008 clot lodged over rul orifice, CT chest: no  mass neg PE     - FOB 10/18/08 negative GERD (ICD-530.81)     -  Neg EGD 5/99     -  Neg 24h ph study 9/99 POLYP, COLON (ICD-211.3)............................................................Marland KitchenMarina Glover      - Colonoscopy 07/07/03       - Colonoscopy 10/31/08 :  1) Adenomatous Polyps, multiple (6) removed,  Severe diverticulosis  RHINITIS, CHRONIC (ICD-472.0) HYPERLIPIDEMIA (ICD-272.4)     - Target LDL < 130 (pos fm hx, pos smoking hx) HEALTH MAINTENANCE..................................................................Marland KitchenWert      -  dt  2/06      -  pneumovax 2/07      -  DEXA nl 08/18/06      -  CPX  Oct 02, 2009     Family History: Coronary Heart Disease female < 63 tather Osteoporsis and  emphysema, mother neg lung ca melanoma in dad  Social History: Started smoking again in March 2010- 3-4 cigs per day. occ alcohol married 2 children Works for [employer]  Vital Signs:  Patient profile:   55 year old female Height:      61 inches Weight:      134 pounds O2 Sat:      98 % on Room air Temp:     97.6 degrees F oral Pulse rate:   93 / minute BP sitting:   118 / 74  (left arm)  Vitals Entered By: [name] (Oct 02, 2009 9:00 AM)  O2 Flow:  Room air  Physical Exam  Additional Exam:  Amb   wf NAD wt  129 Sep 22, 2008 > 132 November 15, 2008 > 136 January 16, 2009 > 136 May 08, 2009>>134 10/02/09 HEENT: nl dentition, turbinates,  external ear canals without cough reflex, NM pale  w/ clear d/ch,    Neck without JVD/Nodes/TM Lungs CTA no wheezing or crackles. RRR no s3 or murmur or increase in P2. No edema Abd soft and benign with nl excursion in the supine position. No bruits or organomegaly Ext without clubbing, cyanosis, calf tenderness Skin: new tattoo on back Pulses sym both feet MS exam nl gait and station   Impression & Recommendations:  Problem # 1:  HYPERLIPIDEMIA (ICD-272.4)  Target LDL < 130 (pos fm hx, pos smoking hx) Her updated medication list for this problem includes:    Vytorin 10-80 Mg Tabs (Ezetimibe-simvastatin) .Marland Kitchen... 1/2 at bedtime     Labs Reviewed: SGOT: 22 (01/16/2009)   SGPT: 19 (01/16/2009)   HDL:54.00 (01/16/2009), 53.8 (07/12/2008)  LDL:DEL (07/12/2008), 103 (06/25/2007) > 131 10/02/09   Chol:206 (01/16/2009), 206 (07/12/2008)  Trig:181.0 (01/16/2009), 100 (07/12/2008)  Problem # 2:  COPD UNSPECIFIED (ICD-496) needs pfts next ov   Problem # 3:  RHINITIS, CHRONIC (ICD-472.0) rx reviewed  Problem # 4:  GERD (ICD-530.81) clinical dx and chronic cough controlled on rx with one daily ppi  Her updated medication list for this problem includes:    Omeprazole 20 Mg Cpdr (Omeprazole) .Marland Kitchen... Take  one 30-60 min before first meal  of the day  Medications Added to Medication List This Visit: 1)  Zyrtec Allergy 10 Mg Tabs (Cetirizine hcl) .Marland Kitchen.. 1 once daily as needed  Other Orders: EKG w/ Interpretation (93000) TLB-Lipid Panel (80061-LIPID) TLB-BMP (Basic Metabolic Panel-BMET) (80048-METABOL) TLB-CBC Platelet - w/Differential (85025-CBCD) TLB-Hepatic/Liver Function Pnl (80076-HEPATIC) TLB-TSH (Thyroid Stimulating Hormone) (84443-TSH) T-Vitamin D (25-Hydroxy) (91478-29562) T-2 View CXR (71020TC) Est. Patient 40-64 years (13086)  Patient Instructions: 1)  Work hard on stopping smoking completely - if can't quit at least avoid smoking triggers 2)  Call [phone] for your results w/in next 3 days -  if there's something important  I feel you need to know,  I'll be in touch with you directly.  3)  Return to office in 3 months, sooner if needed  4)  to see Tammy NP with all your medications, even over the counter meds, separated in two separate bags, the ones you take no matter what vs the ones you stop once you feel better and take only as needed.  She will generate for you a new user friendly medication calendar that will put [place] all on the same page re: your medication use.    [facility] ECG  ID: [id number] Patient: [name], [name] DOB: [birth year] Age: 55 Years Old Sex: Female Race: White Height: 61 Weight: 134 Status: Unconfirmed Past Medical History:  Hx of COUGH (ICD-786.2) Hemoptysis/epistaxis August 31, 2008 ..............................................Marland KitchenWert     - FOB 09/04/2008 clot lodged over rul orifice, CT chest: no  mass neg PE     - FOB 10/18/08 negative GERD (ICD-530.81)     -  Neg EGD 5/99     -  Neg 24h ph study 9/99 POLYP, COLON (ICD-211.3)............................................................Marland KitchenMarina Glover      - Colonoscopy 07/07/03       - Colonoscopy 10/31/08 :  1) Adenomatous Polyps, multiple (6) removed,  Severe diverticulosis  RHINITIS, CHRONIC (ICD-472.0) HYPERLIPIDEMIA (ICD-272.4)     -  Target LDL < 130 (pos fm hx, pos smoking hx) HEALTH MAINTENANCE..................................................................Marland KitchenWert      -  dt  2/06      -  pneumovax 2/07      -  DEXA nl 08/18/06      -  CPX  July 12, 2008    Recorded: 10/02/2009 09:11 AM P/PR: 112 ms / 140 ms - Heart rate (maximum exercise) QRS: 78 QT/QTc/QTd: 397 ms / 423 ms / 70 ms - Heart rate (maximum exercise)  P/QRS/T axis: 60 deg / 73 deg / 69 deg - Heart rate (maximum exercise)  Heartrate: 75 bpm  Interpretation:   sinus rhythm   Normal ECG   Appended Document: Primary svc/ cpx smoking counseling also completed - needs pft's next ov  Appended Document: Primary svc/ cpx LMOMTCB  Appended Document: Primary svc/ cpx PFT sched for 9/29 at 9 am with appt with TP to follow.

## 2010-06-04 NOTE — Assessment & Plan Note (Signed)
Summary: Primary svc/ cpx   Primary Provider/Referring Provider:  [name]  CC:  CPX fasting.  History of Present Illness: 61 yowf quit smoking 3/01  with a history of hyperlipidemia   5/09--with a greater than one-year history of numbness in the feet greater than the hands which she previously denied had a definite pattern but today tells me he only comes on in cold weather it is gone in warm weather.    July 12, 2008 cpx c/o gradual worsening numbness, aggravated by standing a long time or walking, sym and equal bilateral.  no cough or sob. Pt denies any significant sore throat, nasal congestion or excess secretions, fever, chills, sweats, unintended wt loss, pleuritic or exertional cp, orthopnea pnd or leg swelling.  Pt also denies any obvious fluctuation in symptoms with weather or environmental change or other alleviating or aggravating factors.         Current Medications (verified): 1)  Bayer Low Strength 81 Mg  Tbec (Aspirin) .... Take One Tab By Mouth Once Daily 2)  Omeprazole 20 Mg  Cpdr (Omeprazole) .... Take One Tab By Mouth Once Daily 3)  Vivelle-Dot 0.1 Mg/24hr  Pttw (Estradiol) .... Use As Directed Each Monday and Friday 4)  Vytorin 10-80 Mg  Tabs (Ezetimibe-Simvastatin) .... Take 1/2 Tab By Mouth Once Daily 5)  Diethylpropion Hcl Cr 75 Mg  Tb24 (Diethylpropion Hcl) .... Take One Tab By Mouth Once Daily As Needed 6)  Miralax   Powd (Polyethylene Glycol 3350) .... Once Daily 7)  Ultram 50 Mg  Tabs (Tramadol Hcl) .... Take One Tab Every Four Hours As Needed 8)  Afrin Nasal Spray 0.05 %  Soln (Oxymetazoline Hcl) .... As Needed 9)  Calcium 500 500 Mg  Tabs (Calcium Carbonate) .... As Directed 10)  Multivitamins   Tabs (Multiple Vitamin) .... Take 1 Tablet By Mouth Once A Day 11)  Mucinex Dm 30-600 Mg  Tb12 (Dextromethorphan-Guaifenesin) .... Every 12 Hours As Needed 12)  Advil Cold/sinus 30-200 Mg  Tabs (Pseudoephedrine-Ibuprofen) .... As Needed 13)  Vitamin C 500 Mg  Tabs  (Ascorbic Acid) .... Take 1 Tablet By Mouth Once A Day  Allergies (verified): No Known Drug Allergies  Past Medical History:    Hx of COUGH (ICD-786.2)    GERD (ICD-530.81)        -  Neg EGD 5/99        -  Neg 24h ph study 9/99    POLYP, COLON (ICD-211.3)............................................................Marland KitchenMarina Goodell         - Colonoscopy 07/07/03          -  Recall letter reviewed  with pt from 07/06/08 on July 12, 2008     RHINITIS, CHRONIC (ICD-472.0)    HYPERLIPIDEMIA (ICD-272.4)        - Target LDL < 130 (pos fm hx, pos smoking hx)    HEALTH MAINTENANCE         -  dt  2/06         -  pneumovax 2/07         -  DEXA nl 08/18/06         -  CPX  July 12, 2008        Past Surgical History:    sp bladder prolapse repair 11/08    vag hysterectomy 1989  Family History:    Reviewed history from 06/25/2007 and no changes required:       Coronary Heart Disease female < 55 tather  Osteoporsis and emphysema, mother  Social History:    Reviewed history from 06/25/2007 and no changes required:       quit smoking march 01  Review of Systems  The patient denies anorexia, fever, weight loss, weight gain, vision loss, decreased hearing, hoarseness, chest pain, syncope, dyspnea on exertion, peripheral edema, prolonged cough, headaches, hemoptysis, abdominal pain, melena, hematochezia, severe indigestion/heartburn, hematuria, incontinence, muscle weakness, suspicious skin lesions, transient blindness, difficulty walking, depression, unusual weight change, abnormal bleeding, enlarged lymph nodes, angioedema, and breast masses.    Vital Signs:  Patient profile:   55 year old female Height:      62 inches Weight:      130.50 pounds BMI:     23.96 Temp:     98.1 degrees F oral Pulse rate:   75 / minute BP sitting:   100 / 64  (left arm)  Vitals Entered By: Vernie Murders (July 12, 2008 8:48 AM)  O2 Sat on room air at rest %:  100  Physical Exam  Additional Exam:  Afeb with  normal vital signs  wt 132 > 130  07/12/2008 HEENT: nl dentition, turbinates, and orophanx. Nl external ear canals without cough reflex, NM red/swollen w/ clear d/ch, max tenderness.  Neck without JVD/Nodes/TM Lungs clear to A and P bilaterally without cough on insp or exp maneuvers RRR no s3 or murmur or increase in P2 Abd soft and benign with nl excursion in the supine position. No bruits or organomegaly Ext warm without calf tenderness, cyanosis clubbing or edema her pedal pulses are distant but present.     Skin warm and dry without lesions . Neuro:  alert, appropriate, no motor or sensory or cerebellar deficits, nl dtr's including ankles   EKG  Procedure date:  07/12/2008  Findings:      nsr,  wnl  Pulmonary Function Test Height (in.): 62 Gender: Female  Impression & Recommendations:  Problem # 1:  PARESTHESIA (ICD-782.0) Assoc with abn MRI  but more typical of a polyneuropathy with perhaps spinal claudication as an outside possiblity. need neuro input here  Problem # 2:  LUMBOSACRAL RADICULOPATHY (ICD-724.4) fu dr Ophelia Charter  Problem # 3:  GERD (ICD-530.81)  Her updated medication list for this problem includes:    Omeprazole 20 Mg Cpdr (Omeprazole) .Marland Kitchen... Take one tab by mouth once daily  Problem # 4:  HYPERLIPIDEMIA (ICD-272.4) LDL 128 still at target on rx Her updated medication list for this problem includes:    Vytorin 10-80 Mg Tabs (Ezetimibe-simvastatin) .Marland Kitchen... Take 1/2 tab by mouth once daily  Labs Reviewed: Chol: 175 (06/25/2007)   HDL: 50.6 (06/25/2007)   LDL: 103 (06/25/2007)   TG: 108 (06/25/2007) SGOT: 18 (06/25/2007)   SGPT: 18 (06/25/2007)  Complete Medication List: 1)  Bayer Low Strength 81 Mg Tbec (Aspirin) .... Take one tab by mouth once daily 2)  Omeprazole 20 Mg Cpdr (Omeprazole) .... Take one tab by mouth once daily 3)  Vivelle-dot 0.1 Mg/24hr Pttw (Estradiol) .... Use as directed each monday and friday 4)  Vytorin 10-80 Mg Tabs  (Ezetimibe-simvastatin) .... Take 1/2 tab by mouth once daily 5)  Diethylpropion Hcl Cr 75 Mg Tb24 (Diethylpropion hcl) .... Take one tab by mouth once daily as needed 6)  Miralax Powd (Polyethylene glycol 3350) .... Once daily 7)  Ultram 50 Mg Tabs (Tramadol hcl) .... Take one tab every four hours as needed 8)  Afrin Nasal Spray 0.05 % Soln (Oxymetazoline hcl) .... As needed 9)  Calcium 500 500  Mg Tabs (Calcium carbonate) .... As directed 10)  Multivitamins Tabs (Multiple vitamin) .... Take 1 tablet by mouth once a day 11)  Mucinex Dm 30-600 Mg Tb12 (Dextromethorphan-guaifenesin) .... Every 12 hours as needed 12)  Advil Cold/sinus 30-200 Mg Tabs (Pseudoephedrine-ibuprofen) .... As needed 13)  Vitamin C 500 Mg Tabs (Ascorbic acid) .... Take 1 tablet by mouth once a day  Other Orders: EKG w/ Interpretation (93000) TLB-Lipid Panel (80061-LIPID) TLB-BMP (Basic Metabolic Panel-BMET) (80048-METABOL) TLB-CBC Platelet - w/Differential (85025-CBCD) TLB-Hepatic/Liver Function Pnl (80076-HEPATIC) TLB-TSH (Thyroid Stimulating Hormone) (84443-TSH) TLB-CRP-Full Range (C-Reactive Protein) (86140-FCRP) T-Vitamin D (25-Hydroxy) (16109-60454) T-2 View CXR, Same Day (71020.5TC) Neurology Referral (Neuro) Est. Patient 40-64 years (09811)  Patient Instructions: 1)  Call [phone] for lab results by end of week 2)  See Patient Care Coordinator before leaving for neurology referral 3)  Call the GI dept to schedule colonoscopy 4)  Please schedule a follow-up appointment in 1 year. 5)  The medication list was reviewed and reconciled.  All changed / newly prescribed medications were explained.  A complete medication list was provided to the patient / caregiver.

## 2010-06-04 NOTE — Assessment & Plan Note (Signed)
Summary: NP follow up - med calendar/PFT results   Primary [name]/Referring [name]:  [name]  CC:  est med calendar - pt brought all meds with her today.  had PFT this morning as well.Marland Kitchen  History of Present Illness: 31 yowf active smoker with h/o hyperlipidemia and chronic bronchitis   August 31, 2008 new onset hempotysis assoc with epistaxis started without antecedent uri x 24 hours, abrupt onset,    rx as aecopd .  Sep 02, 2008  to ED with recurrent hemoptysis.  At least 2 cups of blood bright red. admit thru May 4 with FOB 5/3 clot over rul  with neg cytology.  Sep 07, 2008 ov cc osb, mild sense of chest congestion, minimal old blood coughed up x 24 hours.   Sep 14, 2008--ov  follow up and med review. Last visit Advair 250/50 added and prilosec increased two times a day. Over last week, less coughing, w/ less old blood mixed in clear mucous. Starting to feel better, w/ improved energy.  10/18/08 neg fob  November 15, 2008 ov smoking 1-2 per day vs 10 per day.   May 31 , 2011 cpx rec med calendar > scheduled end of Sept  January 15, 2010 Acute visit.  Pt c/o left side back pain x 4 days, waxes and wanes since onset. She states that she thinks that she may have pulled a muscle while cleaning.  She states that pain gets worse with deep breath.  She states that advil not helping with pain.  She states that she has also noticed some tingling in her left arm since pain started.   >>tx w/ tramadol/advil.   PG 2>>>>>>>>>>>>>>>> January 31, 2010 -Presents for follow up and med review. Had PFT today which showed   FEV1 at 2.55L/m (112% of predicted). She is doing well with no smoking. Now working at [employer]. Exercising regularly, walking 2 miles a day. Back is feeling better but still sore esp in evening time. We discussed good posture and rest breaks with computer work. Denies chest pain, dyspnea, orthopnea, hemoptysis, fever, n/v/d, edema, headache. CXR from last ov with no acute changes.    Medications Prior to Update: 1)  Multivitamins   Tabs (Multiple Vitamin) .... Take 1 Tablet By Mouth Once A Day 2)  Vitamin C 500 Mg  Tabs (Ascorbic Acid) .... Take 1 Tablet By Mouth Once A Day 3)  Omeprazole 20 Mg  Cpdr (Omeprazole) .... Take  One 30-60 Min Before First Meal of The Day 4)  Calcium 500 500 Mg  Tabs (Calcium Carbonate) .... 2 Daily 5)  Vivelle-Dot 0.1 Mg/24hr  Pttw (Estradiol) .... Use As Directed Each Monday and Friday 6)  Vytorin 10-80 Mg  Tabs (Ezetimibe-Simvastatin) .... 1/2 At Bedtime 7)  Advair Diskus 250-50 Mcg/dose  Misc (Fluticasone-Salmeterol) .... One Puff Once Daily 8)  Miralax   Powd (Polyethylene Glycol 3350) .... Once Daily 9)  Diethylpropion Hcl Cr 75 Mg Xr24h-Tab (Diethylpropion Hcl) .... Take 1 Tablet By Mouth Daily As Needed 10)  Ultram 50 Mg  Tabs (Tramadol Hcl) .Marland Kitchen.. 1-2 Every 4 Hours As Needed For Pain 11)  Alprazolam 0.5 Mg Tbdp (Alprazolam) .... One Every 6 Hours If Needed For Nerves or Breathessless 12)  Mucinex Dm 30-600 Mg  Tb12 (Dextromethorphan-Guaifenesin) .Marland Kitchen.. 1-2 Every 12 Hours For Congestion or Cough Every 12 Hours 13)  Afrin Nasal Spray 0.05 %  Soln (Oxymetazoline Hcl) .... As Needed 14)  Triamcinolone Acetonide 0.1 % Pste (Triamcinolone Acetonide) .... Apply  As Directed Four Times A Day 15)  Valtrex 500 Mg Tabs (Valacyclovir Hcl) .... 1/2 Once Daily 16)  Antivert 25 Mg Tabs (Meclizine Hcl) .... One Every 4 Hours As Needed For Dizziness  Allergies (verified): No Known Drug Allergies  Past History:  Past Surgical History: Last updated: 07/12/2008 sp bladder prolapse repair 11/08 vag hysterectomy 1989  Family History: Last updated: 10/02/2009 Coronary Heart Disease female < 55 tather Osteoporsis and emphysema, mother neg lung ca melanoma in dad  Social History: Last updated: 01/15/2010  occ alcohol married 2 children Works for [employer] Former smoker- states that she quit on Sept 1st 2011  Risk Factors: Smoking Status: quit <  6 months (01/15/2010) Packs/Day: 3-4cigs A DAY (06/27/2009)  Past Medical History:  Hemoptysis/epistaxis August 31, 2008 ..............................................Marland KitchenWert     - FOB 09/04/2008 clot lodged over rul orifice, CT chest: no  mass neg PE     - FOB 10/18/08 negative Bronchitis FEV1 2.55 l/m , (112%, ratio of 66--January 31, 2010) GERD (ICD-530.81)     -  Neg EGD 5/99     -  Neg 24h ph study 9/99 POLYP, COLON (ICD-211.3)............................................................Marland KitchenMarina Goodell      - Colonoscopy 07/07/03       - Colonoscopy 10/31/08 :  1) Adenomatous Polyps, multiple (6) removed,  Severe diverticulosis  RHINITIS, CHRONIC (ICD-472.0) HYPERLIPIDEMIA (ICD-272.4)     - Target LDL < 130 (pos fm hx, pos smoking hx) HEALTH MAINTENANCE.......................................................................Marland KitchenWert      -  dt  2/06      -  pneumovax 2/07      -  DEXA nl 08/18/06      -  CPX  Oct 02, 2009  Complex med regimen  Meds reviewed with pt education and computerized med calendar January 31, 2010     Vital Signs:  Patient profile:   55 year old female Height:      61 inches Weight:      133 pounds BMI:     25.22 O2 Sat:      99 % on Room air Temp:     97.6 degrees F oral Pulse rate:   86 / minute BP sitting:   106 / 64  (left arm) Cuff size:   regular  Vitals Entered By: [name] CNA/MA (January 31, 2010 9:53 AM)  O2 Flow:  Room air CC: est med calendar - pt brought all meds with her today.  had PFT this morning as well. Is Patient Diabetic? No Comments Medications reviewed with patient Daytime contact number verified with patient. [name] CNA/MA  January 31, 2010 9:52 AM    Physical Exam  Additional Exam:  Amb very anxious   wf NAD wt  129 Sep 22, 2008 > 132 November 15, 2008 > 136 January 16, 2009 >   134 January 15, 2010 >>133 January 31, 2010  HEENT: nl dentition, turbinates,  external ear canals without cough reflex, NM pale  w/  clear d/ch,    Neck without JVD/Nodes/TM Lungs CTA no wheezing or crackles. RRR no s3 or murmur or increase in P2. No edema Abd soft and benign with nl excursion in the supine position. No bruits or organomegaly no cvat Ext without clubbing, cyanosis, calf tenderness .  Skin: warm and dry,no rash.  Pulses sym both feet     Impression & Recommendations:  Problem # 1:  COPD UNSPECIFIED (ICD-496) PFT showed preserved lung fxn.  encouraged on smoking cesstation.  Meds reviewed with pt education and  computerized med calendar   Problem # 2:  LUMBOSACRAL RADICULOPATHY (ICD-724.4)  improving w/ nsaids.  Please contact office for sooner follow up if symptoms do not improve or worsen   Orders: Est. Patient Level III (69629)  Patient Instructions: 1)  We are very proud of you for not smoking.  2)  Follow med calendar closely and bring to each visit.  3)  follow up Dr. Sherene Glover in 3 months  and as needed       Appended Document: med calendar update    Clinical Lists Changes  Medications: Changed medication from CALCIUM 500 500 MG  TABS (CALCIUM CARBONATE) 2 daily to CALCIUM CARBONATE-VITAMIN D 600-400 MG-UNIT  TABS (CALCIUM CARBONATE-VITAMIN D) 1 tab by mouth  every morning and at bedtime Added new medication of SALINE NASAL SPRAY 0.65 % SOLN (SALINE) 2 puffs each nostirl once daily Changed medication from TRIAMCINOLONE ACETONIDE 0.1 % PSTE (TRIAMCINOLONE ACETONIDE) apply as directed four times a day to TRIAMCINOLONE ACETONIDE 0.1 % PSTE (TRIAMCINOLONE ACETONIDE) apply as directed four times a day as needed Changed medication from ULTRAM 50 MG  TABS (TRAMADOL HCL) 1-2 every 4 hours as needed for pain to ULTRAM 50 MG  TABS (TRAMADOL HCL) 1 tab by mouth every 4 hours as needed Changed medication from MIRALAX   POWD (POLYETHYLENE GLYCOL 3350) once daily to MIRALAX   POWD (POLYETHYLENE GLYCOL 3350) 1 capful daily as needed Added new medication of TYLENOL PM EXTRA STRENGTH 500-25 MG TABS  (DIPHENHYDRAMINE-APAP (SLEEP)) Take 1 tab by mouth at bedtime as needed Changed medication from ANTIVERT 25 MG TABS (MECLIZINE HCL) one every 4 hours as needed for dizziness to ANTIVERT 25 MG TABS (MECLIZINE HCL) one every 8 hours as needed for dizziness Changed medication from VALTREX 500 MG TABS (VALACYCLOVIR HCL) 1/2 once daily to VALTREX 500 MG TABS (VALACYCLOVIR HCL) 2 tabs daily as needed

## 2010-06-04 NOTE — Progress Notes (Signed)
Summary: RX  Phone Note Call from Patient Call back at Work Phone [phone]   Caller: Patient Call For: AOZH Reason for Call: Acute Illness, Talk to Nurse Summary of Call: PT HAS AN EARACHE AND WANTS TO KNOW WHAT SHE CAN GET OVER THE COUNTER FOR IT? Initial call taken by: Eugene Gavia,  Sep 03, 2007 9:01 AM  Follow-up for Phone Call        [email or web address]  Philipp Deputy [facility]  Sep 03, 2007 10:37 AM   pt called back please try her at work number [phone]. Chantel Bowne  Sep 03, 2007 12:38 PM  Additional Follow-up for Phone Call Additional follow up Details #1::        per Cicero Duck asked what she could use instructed pt to use ibuprofin for the pain if it gets worse to call office for an appt Additional Follow-up by: Lacinda Axon,  Sep 03, 2007 4:05 PM

## 2010-06-04 NOTE — Progress Notes (Signed)
Summary: difficulty sleeping - needs ov  Phone Note Call from Patient   Caller: Patient Call For: Malon Siddall Summary of Call: pt says she is still taking xanax for her sob and anxiety. however, she says she just can't sleep. says dr Vassie Kugel told her not to take everyday, but she has been as she thought it would help her sleep. please advise. cell [phone] Initial call taken by: Tivis Ringer,  May 03, 2009 11:10 AM  Follow-up for Phone Call        pt states she ahs been following MW recs and does not drink caffine after breakfast, and dims the ligths before bedtime, but she is still having difficulty sleeping. Pt takes xanax for anxiety and was advised by MW not to depend onit for slep, but pt states she has been using xanax to sleep. She was wondering what other options there were. Please advise. Carron Curie CMA  May 03, 2009 11:18 AM keep using as needed for now but ov before next refill to consider other long term options Follow-up by: Nyoka Cowden MD,  May 03, 2009 12:27 PM  Additional Follow-up for Phone Call Additional follow up Details #1::        pt schedule dto see MW on 05/08/2009 at 2:55. Carron Curie CMA  May 03, 2009 12:33 PM

## 2010-06-04 NOTE — Consult Note (Signed)
Summary: Consultation Report/[facility]  Consultation Report/[facility]   Imported By: Lester Grambling 10/27/2007 11:10:45  _____________________________________________________________________  External Attachment:    Type:   Image     Comment:   External Document

## 2010-06-04 NOTE — Assessment & Plan Note (Signed)
Summary: Primary svc/ acute post hosp ov   Primary Provider/Referring Provider:  [name]  CC:  Acute visit.  Pt c/o increased sob x 2 days.  She states that she gets out of breath with little exertion and also when lies down.  She feels like heart is racing.  She had some hemoptysis earlier today- states that it was dark red in color.  .  History of Present Illness: 3 yowf active smoker with h/o hyperlipidemia.  August 31, 2008 new onset hempotysis assoc with epistaxis started without antecedent uri x 24 hours, abrupt onset,     Rx  1)  mucinex dm 2 every 12 hours and add tramadol 50 mg up to 2 every 4 hours to suppress the urge to cough. Swallowing water or using ice chips/non mint and menthol containing candies (such as lifesavers or sugarless jolly ranchers) are also effective.  2)  Doxycyclinine 100mg  twice daily x 10 days 3)  Prednisone 10 4 each am x 2days, 2x2days, 1x2days and stop  4)  Bedrest for 72 hours and no smoking  5)  no aspirin or flonase or advil cold and sinus 6)  no smoking  Sep 02, 2008  to ED with recurrent hemoptysis.  At least 2 cups of blood bright red. admit thru May 4 with FOB 5/3 clot over rul  with neg cytology.  Sep 07, 2008 ov cc osb, mild sense of chest congestion, minimal old blood coughed up x 24 hours. Pt denies any significant sore throat, nasal congestion or excess secretions, fever, chills, sweats, unintended wt loss, pleuritic or exertional cp, orthopnea pnd or leg swelling.  Pt denies any significant sore throat, nasal congestion , fever, chills, sweats, unintended weight loss, pleurtic or exertional chest pain, orthopnea PND, or leg swelling Pt denies any increase in rescue therapy over baseline, denies waking up needing it or having any early am or nocturnal exacerbations of coughing/wheezing/or dyspnea.   Current Medications (verified): 1)  Omeprazole 20 Mg  Cpdr (Omeprazole) .... Take One Tab By Mouth Once Daily 2)  Vivelle-Dot 0.1 Mg/24hr  Pttw  (Estradiol) .... Use As Directed Each Monday and Friday 3)  Vytorin 10-80 Mg  Tabs (Ezetimibe-Simvastatin) .... Take 1/2 Tab By Mouth Once Daily 4)  Diethylpropion Hcl Cr 75 Mg  Tb24 (Diethylpropion Hcl) .... Take One Tab By Mouth Once Daily As Needed 5)  Miralax   Powd (Polyethylene Glycol 3350) .... Once Daily 6)  Ultram 50 Mg  Tabs (Tramadol Hcl) .... Take One Tab Every Four Hours As Needed 7)  Afrin Nasal Spray 0.05 %  Soln (Oxymetazoline Hcl) .... As Needed 8)  Calcium 500 500 Mg  Tabs (Calcium Carbonate) .... As Directed 9)  Multivitamins   Tabs (Multiple Vitamin) .... Take 1 Tablet By Mouth Once A Day 10)  Mucinex Dm 30-600 Mg  Tb12 (Dextromethorphan-Guaifenesin) .... Every 12 Hours As Needed 11)  Advil Cold/sinus 30-200 Mg  Tabs (Pseudoephedrine-Ibuprofen) .... As Needed 12)  Vitamin C 500 Mg  Tabs (Ascorbic Acid) .... Take 1 Tablet By Mouth Once A Day 13)  Doxycycline Monohydrate 100 Mg  Caps (Doxycycline Monohydrate) .... One Twice Daily 14)  Prednisone 10 Mg  Tabs (Prednisone) .... 4 Each Am X 2days, 2x2days, 1x2days and Stop 15)  Alprazolam 0.5 Mg Tbdp (Alprazolam) .... One Every 6 Hours If Needed 16)  Delsym 30 Mg/79ml Lqcr (Dextromethorphan Polistirex) .... Every 12 Hours As Needed  Allergies (verified): No Known Drug Allergies  Past History:  Past Medical  History:    Hx of COUGH (ICD-786.2)    Hemoptysis/epistaxis August 31, 2008 .............................................Marland KitchenWert        - FOB 09/04/2008 clot lodged over rul orifice, CT chest: no  mass    GERD (ICD-530.81)        -  Neg EGD 5/99        -  Neg 24h ph study 9/99    POLYP, COLON (ICD-211.3)............................................................Marland KitchenMarina Goodell         - Colonoscopy 07/07/03          -  Recall letter reviewed  with pt from 07/06/08 on July 12, 2008     RHINITIS, CHRONIC (ICD-472.0)    HYPERLIPIDEMIA (ICD-272.4)        - Target LDL < 130 (pos fm hx, pos smoking hx)    HEALTH  MAINTENANCE..................................................................Marland KitchenWert         -  dt  2/06         -  pneumovax 2/07         -  DEXA nl 08/18/06         -  CPX  July 12, 2008        Vital Signs:  Patient profile:   55 year old female Weight:      125 pounds Temp:     98.1 degrees F oral Pulse rate:   93 / minute BP sitting:   134 / 88  (left arm)  Vitals Entered By: Vernie Murders (Sep 07, 2008 2:14 PM)  O2 Sat on room air at rest %:  98  Physical Exam  Additional Exam:  Amb depressed/anxious  wf NAD HEENT: nl dentition, turbinates, and orophanx. Nl external ear canals without cough reflex, NM red/swollen w/ clear d/ch, max tenderness.  Neck without JVD/Nodes/TM Lungs with insp/exp rhochi, r > L  RRR no s3 or murmur or increase in P2. No edema Abd soft and benign with nl excursion in the supine position. No bruits or organomegaly Ext warm without calf tenderness, cyanosis clubbing  Skin warm and dry without lesions .    CXR  Procedure date:  09/07/2008  Findings:      minimal atx changes  rul/rml   Impression & Recommendations:  Problem # 1:  HEMOPTYSIS (ICD-786.3)  Resolved with retained old blood clots may be causing mild airways obstruction and inflammation.  Try Advair 250/50 one twice daily.  I spent extra time with the patient today explaining optimal DPI   technique.  This improved from  50-90%  Medications Added to Medication List This Visit: 1)  Omeprazole 20 Mg Cpdr (Omeprazole) .... Take one 30-60 min before first and last meals of the day 2)  Calcium 500 500 Mg Tabs (Calcium carbonate) .... 2 daily 3)  Advair Diskus 250-50 Mcg/dose Misc (Fluticasone-salmeterol) .... One puff twice daily 4)  Ultram 50 Mg Tabs (Tramadol hcl) .... Take one tab every four hours as needed for cough or pain 5)  Mucinex Dm 30-600 Mg Tb12 (Dextromethorphan-guaifenesin) .Marland Kitchen.. 1-2 every 12 hours for congestion or cough every 12 hours 6)  Alprazolam 0.5 Mg Tbdp  (Alprazolam) .... One every 6 hours if needed for nerves or breathessless 7)  Delsym 30 Mg/25ml Lqcr (Dextromethorphan polistirex) .... Every 12 hours as needed  Complete Medication List: 1)  Omeprazole 20 Mg Cpdr (Omeprazole) .... Take one 30-60 min before first and last meals of the day 2)  Vivelle-dot 0.1 Mg/24hr Pttw (Estradiol) .... Use as directed each monday and friday 3)  Vytorin 10-80 Mg Tabs (Ezetimibe-simvastatin) .... Take 1/2 tab by mouth once daily 4)  Miralax Powd (Polyethylene glycol 3350) .... Once daily 5)  Multivitamins Tabs (Multiple vitamin) .... Take 1 tablet by mouth once a day 6)  Calcium 500 500 Mg Tabs (Calcium carbonate) .... 2 daily 7)  Vitamin C 500 Mg Tabs (Ascorbic acid) .... Take 1 tablet by mouth once a day 8)  Advair Diskus 250-50 Mcg/dose Misc (Fluticasone-salmeterol) .... One puff twice daily 9)  Ultram 50 Mg Tabs (Tramadol hcl) .... Take one tab every four hours as needed for cough or pain 10)  Afrin Nasal Spray 0.05 % Soln (Oxymetazoline hcl) .... As needed 11)  Mucinex Dm 30-600 Mg Tb12 (Dextromethorphan-guaifenesin) .Marland Kitchen.. 1-2 every 12 hours for congestion or cough every 12 hours 12)  Alprazolam 0.5 Mg Tbdp (Alprazolam) .... One every 6 hours if needed for nerves or breathessless 13)  Doxycycline Monohydrate 100 Mg Caps (Doxycycline monohydrate) .... One twice daily 14)  Prednisone 10 Mg Tabs (Prednisone) .... 4 each am x 2days, 2x2days, 1x2days and stop  Other Orders: T-2 View CXR, Same Day (71020.5TC) Est. Patient Level III (26948)  Patient Instructions: 1)  add advair 250/50 one twice daily 2)  increase omeprazole to Take one 30-60 min before first and last meals of the day  3)  See Tammy NP w/in 1 weeks with all your medications, even over the counter meds, separated in two separate bags, the ones you take no matter what vs the ones you stop once you feel better and take only as needed.  She will generate for you a new user friendly medication  calendar that will put [place] all on the same page re: your medication use.  Prescriptions: ULTRAM 50 MG  TABS (TRAMADOL HCL) take one tab every four hours as needed for cough or pain  #40 x 0   Entered and Authorized by:   Nyoka Cowden MD   Signed by:   Nyoka Cowden MD on 09/07/2008   Method used:   Electronically to        [facility] [address] [phone]* (retail)       [address]       [place], [place]  [address]       Ph: [phone] or [phone]       Fax: [phone]   RxID:   [id number]

## 2010-06-04 NOTE — Progress Notes (Signed)
Summary: alprazolam refill  Phone Note Refill Request Call back at [facility] Message from:  Fax from Pharmacy on April 11, 2010 2:42 PM  Refills Requested: Medication #1:  ALPRAZOLAM 0.5 MG TBDP one every 6 hours if needed for nerves or breathessless   Dosage confirmed as above?Dosage Confirmed   Last Refilled: 01/22/2010 received refill request from pharmacy.  last seen by TP 9.29.11 for med cal, told to follow up 3 months.  no pending appt.  please advise if okay to fill.  Initial call taken by: [name] CNA/MA,  April 11, 2010 2:43 PM  Follow-up for Phone Call        ok to refill but needs ov q 3 months Follow-up by: Nyoka Cowden MD,  April 11, 2010 5:21 PM    Prescriptions: ALPRAZOLAM 0.5 MG TBDP (ALPRAZOLAM) one every 6 hours if needed for nerves or breathessless  #90 x 0   Entered by:   Vernie Murders   Authorized by:   Nyoka Cowden MD   Signed by:   Vernie Murders on 04/11/2010   Method used:   Telephoned to ...       [facility] [address] [id number]       [address]       [place], [place]  [address]       Ph: [phone]       Fax: [phone]   RxID:   [id number]

## 2010-06-04 NOTE — Assessment & Plan Note (Signed)
Summary: Primary svc/ eval  back pain/ no longer smoking   Primary Provider/Referring Provider:  [name]  CC:  Acute visit.  Pt c/o left side back pain x 4 days.  She states that she thinks that she may have pulled a muscle while cleaning.  She states that pain gets worse with deep breath.  She states that advil not helping with pain.  She states that she has also noticed some tingling in her left arm since pain started.[name]  History of Present Illness: 55 yowf active smoker with h/o hyperlipidemia and chronic bronchitis   August 31, 2008 new onset hempotysis assoc with epistaxis started without antecedent uri x 24 hours, abrupt onset,    rx as aecopd .  Sep 02, 2008  to ED with recurrent hemoptysis.  At least 2 cups of blood bright red. admit thru May 4 with FOB 5/3 clot over rul  with neg cytology.  Sep 07, 2008 ov cc osb, mild sense of chest congestion, minimal old blood coughed up x 24 hours.   Sep 14, 2008--ov  follow up and med review. Last visit Advair 250/50 added and prilosec increased two times a day. Over last week, less coughing, w/ less old blood mixed in clear mucous. Starting to feel better, w/ improved energy.  10/18/08 neg fob  November 15, 2008 ov smoking 1-2 per day vs 10 per day.   May 31 , 2011 cpx rec med calendar > scheduled end of Sept  January 15, 2010 Acute visit.  Pt c/o left side back pain x 4 days, waxes and wanes since onset. She states that she thinks that she may have pulled a muscle while cleaning.  She states that pain gets worse with deep breath.  She states that advil not helping with pain.  She states that she has also noticed some tingling in her left arm since pain started.   no sob, no dysuria. Pt denies any significant sore throat, dysphagia, itching, sneezing,  nasal congestion or excess secretions,  fever, chills, sweats, unintended wt loss, classic  pleuritic or exertional cp, hempoptysis, change in activity tolerance  orthopnea pnd or leg swelling   July 20, 2009 --Returns for persistent symptoms. Complains of fluid in both ears that occ causes dizziness.  pt was last seen 2-23 for sinus inf and given omnicef, but pt states ears have not improved and still has slight sinus pressure and clear dainage. She is better but feels like she hs fluid in ears, get lightheaded intermittently when turns head or leans back. no speech/visual changes. Denies chest pain, dyspnea, orthopnea, hemoptysis, fever, n/v/d, edema, headache, ext weakness.   Oct 02, 2009 cpx still smoking tried wellbutrin and chantix no benefit - nicotine gum worked the best  [employer]  Alcohol-Tobacco     Smoking Status: quit < 6 months     Smoking Cessation Counseling: yes  Current Medications (verified): 1)  Multivitamins   Tabs (Multiple Vitamin) .... Take 1 Tablet By Mouth Once A Day 2)  Vitamin C 500 Mg  Tabs (Ascorbic Acid) .... Take 1 Tablet By Mouth Once A Day 3)  Omeprazole 20 Mg  Cpdr (Omeprazole) .... Take  One 30-60 Min Before First Meal of The Day 4)  Calcium 500 500 Mg  Tabs (Calcium Carbonate) .... 2 Daily 5)  Vivelle-Dot 0.1 Mg/24hr  Pttw (Estradiol) .... Use As Directed Each Monday and Friday 6)  Vytorin 10-80 Mg  Tabs (Ezetimibe-Simvastatin) .... 1/2 At Bedtime  7)  Advair Diskus 250-50 Mcg/dose  Misc (Fluticasone-Salmeterol) .... One Puff Once Daily 8)  Miralax   Powd (Polyethylene Glycol 3350) .... Once Daily 9)  Diethylpropion Hcl Cr 75 Mg Xr24h-Tab (Diethylpropion Hcl) .... Take 1 Tablet By Mouth Daily As Needed 10)  Ultram 50 Mg  Tabs (Tramadol Hcl) .... Take One Tab Every Four Hours As Needed For Cough or Pain 11)  Alprazolam 0.5 Mg Tbdp (Alprazolam) .... One Every 6 Hours If Needed For Nerves or Breathessless 12)  Mucinex Dm 30-600 Mg  Tb12 (Dextromethorphan-Guaifenesin) .[name].. 1-2 Every 12 Hours For Congestion or Cough Every 12 Hours 13)  Afrin Nasal Spray 0.05 %  Soln (Oxymetazoline Hcl) .... As Needed 14)  Triamcinolone  Acetonide 0.1 % Pste (Triamcinolone Acetonide) .... Apply As Directed Four Times A Day 15)  Valtrex 500 Mg Tabs (Valacyclovir Hcl) .... 1/2 Once Daily 16)  Singulair 10 Mg Tabs (Montelukast Sodium) .[name].. 1 Once Daily  Allergies (verified): No Known Drug Allergies  Past History:  Past Medical History:  Hemoptysis/epistaxis August 31, 2008 ..............................................Kimberly KitchenWert     - FOB 09/04/2008 clot lodged over rul orifice, CT chest: no  mass neg PE     - FOB 10/18/08 negative GERD (ICD-530.81)     -  Neg EGD 5/99     -  Neg 24h ph study 9/99 POLYP, COLON (ICD-211.3)............................................................Kimberly KitchenMarina Goodell      - Colonoscopy 07/07/03       - Colonoscopy 10/31/08 :  1) Adenomatous Polyps, multiple (6) removed,  Severe diverticulosis  RHINITIS, CHRONIC (ICD-472.0) HYPERLIPIDEMIA (ICD-272.4)     - Target LDL < 130 (pos fm hx, pos smoking hx) HEALTH MAINTENANCE.......................................................................Kimberly KitchenWert      -  dt  2/06      -  pneumovax 2/07      -  DEXA nl 08/18/06      -  CPX  Oct 02, 2009     Social History:  occ alcohol married 2 children Works for [employer] E[employer] Former smoker- states that she quit on Sept 1st 2011  Smoking Status:  quit < 6 months  Vital Signs:  Patient profile:   55 year old female Weight:      134 pounds O2 Sat:      99 % on Room air Temp:     98.1 degrees F oral Pulse rate:   81 / minute BP sitting:   120 / 80  (left arm)  Vitals Entered By: Vernie Murders (January 15, 2010 9:20 AM)  O2 Flow:  Room air  Physical Exam  Additional Exam:  Amb very anxious   wf NAD wt  129 Sep 22, 2008 > 132 November 15, 2008 > 136 January 16, 2009 >   134 January 15, 2010  HEENT: nl dentition, turbinates,  external ear canals without cough reflex, NM pale  w/ clear d/ch,    Neck without JVD/Nodes/TM Lungs CTA no wheezing or crackles. RRR no s3 or murmur or increase in P2. No edema Abd soft and  benign with nl excursion in the supine position. No bruits or organomegaly no cvat Ext without clubbing, cyanosis, calf tenderness .  Skin: warm and dry, no rash over back, L side  of chest Pulses sym both feet MS no muscle tenderness over L post chest, nl motion L shoulder   CXR  Procedure date:  01/15/2010  Findings:      No acute cardiopulmonary disease.  Mild peribronchial thickening is seen which can be associated with bronchitis.  Impression & Recommendations:  Problem # 1:  CHEST PAIN (ICD-786.50) No specific features so probably MS CP rx with tramadol and advil with CT Chest only if pain refractory  Each maintenance medication was reviewed in detail including most importantly the difference between maintenance prns and under what circumstances the prns are to be used.  In addition, these two groups (for which the patient should keep up with refills) were distinguished from a third group :  meds that are used only short term with the intent to complete a course of therapy and then not refill them.  The med list was then fully reconciled and reorganized to reflect this important distinction.   Problem # 2:  SMOKER (ICD-305.1) Just stopped w/in last 2 weeks so high risk recurrent smoking.  Rec maintain off cigs  Problem # 3:  DIZZINESS (ICD-780.4) non-specific c/o not reproducible with standing, more c/w bpv  Problem # 4:  RHINITIS, CHRONIC (ICD-472.0) no evidence of benefit from singulair, try off  Medications Added to Medication List This Visit: 1)  Ultram 50 Mg Tabs (Tramadol hcl) .[name].. 1-2 every 4 hours as needed for pain 2)  Singulair 10 Mg Tabs (Montelukast sodium) .[name].. 1 once daily 3)  Antivert 25 Mg Tabs (Meclizine hcl) .... One every 4 hours as needed for dizziness  Other Orders: Est. Patient Level IV (99214) T-2 View CXR (71020TC)  Patient Instructions: 1)  Try tramadol 50 1-2 every 4 hours as needed call if no relief 2)  If take advil try 3 with meals as  needed  3)  stop singulair 4)  Take meclizine 25 mg one every 4 hours if needed for dizziness 5)  Call if condition worsens 6)  keep appt to see Tammy  with all your medications, even over the counter meds, separated in two separate bags, the ones you take no matter what vs the ones you stop once you feel better and take only as needed.  She will generate for you a new user friendly medication calendar that will put [place] all on the same page re: your medication use.  Prescriptions: ANTIVERT 25 MG TABS (MECLIZINE HCL) one every 4 hours as needed for dizziness  #400 x 0   Entered and Authorized by:   Nyoka Cowden MD   Signed by:   Nyoka Cowden MD on 01/15/2010   Method used:   [facility]  [address] [phone][facility] (retail[facility]       [address]       [place], [place]  [address]       Ph: [phone]       Fax: [phone]   RxID:   [id number] ULTRAM 50 MG  TABS (TRAMADOL HCL) 1-2 every 4 hours as needed for pain  #40 x 0   Entered and Authorized by:   Nyoka Cowden MD   Signed by:   Nyoka Cowden MD on 01/15/2010   Method used:   [facility]  [address] [phone]* (retail)       [address]       [place], [place]  [address]       Ph: [phone]       Fax: [phone]   RxID:   [id number]

## 2010-06-04 NOTE — Assessment & Plan Note (Signed)
Summary: Admission History and Physical   Primary Provider/Referring Provider:  [name]   History of Present Illness: 82 yowf active smoker with h/o hyperlipidemia.  5/09--with a greater than one-year history of numbness in the feet greater than the hands which she previously denied had a definite pattern but then later revealed  only comes on in cold weather it is gone in warm weather.    July 12, 2008 cpx c/o gradual worsening numbness, aggravated by standing a long time or walking, sym and equal bilateral.  referred to neuro  August 31, 2008 new onset hempotysis assoc with epistaxis started without antecedent uri x 24 hours, abrupt onset,  took baby aspirin this am maybe  2 cups the last episode at 1030am,  no pain with deep breath no sob. Reports  started back smoking one month prior to ov with a little sinus congestion,  and no sign ocugh.  Seen in ER  no rx rendered.    Pt denies any significant sore throat, nasal congestion or excess secretions, fever, chills, sweats, unintended wt loss, pleuritic or exertional cp, orthopnea pnd or leg swelling.  Pt also denies any obvious fluctuation in symptoms with weather or environmental change or other alleviating or aggravating factors.     Sep 02, 2008 6:54 AM Pt came to ED with recurrent hemoptysis.  At least 2 cups of blood bright red.  Has been to ED and office this past week with same.   Rx at 4/29 Ov was: 1)  mucinex dm 2 every 12 hours and add tramadol 50 mg up to 2 every 4 hours to suppress the urge to cough. Swallowing water or using ice chips/non mint and menthol containing candies (such as lifesavers or sugarless jolly ranchers) are also effective.  2)  Doxycyclinine 100mg  twice daily x 10 days 3)  Prednisone 10 4 each am x 2days, 2x2days, 1x2days and stop  4)  Bedrest for 72 hours and no smoking  5)  no aspirin or flonase or advil cold and sinus 6)  no smoking 7)  if condition worsens significantly on this regimen to er  Pt recurred  and denies chest pain,   Pt is dyspneic during the episodes. Pt denies any significant sore throat, nasal congestion , fever, chills, sweats, unintended weight loss, pleurtic or exertional chest pain, orthopnea PND, or leg swelling Pt denies any increase in rescue therapy over baseline, denies waking up needing it or having any early am or nocturnal exacerbations of coughing/wheezing/or dyspnea.   Allergies: No Known Drug Allergies  Past History:  Past Medical History:    Reviewed history from 08/31/2008 and no changes required:    Hx of COUGH (ICD-786.2)    Hemoptysis/epistaxis August 31, 2008 .............................................Marland KitchenWert    GERD (ICD-530.81)        -  Neg EGD 5/99        -  Neg 24h ph study 9/99    POLYP, COLON (ICD-211.3)............................................................Marland KitchenMarina Goodell         - Colonoscopy 07/07/03          -  Recall letter reviewed  with pt from 07/06/08 on July 12, 2008     RHINITIS, CHRONIC (ICD-472.0)    HYPERLIPIDEMIA (ICD-272.4)        - Target LDL < 130 (pos fm hx, pos smoking hx)    HEALTH MAINTENANCE..................................................................Marland KitchenWert         -  dt  2/06         -  pneumovax 2/07         -  DEXA nl 08/18/06         -  CPX  July 12, 2008        Past Surgical History:    Reviewed history from 07/12/2008 and no changes required:    sp bladder prolapse repair 11/08    vag hysterectomy 1989  Family History:    Reviewed history from 08/31/2008 and no changes required:       Coronary Heart Disease female < 64 tather       Osteoporsis and emphysema, mother       neg lung ca  Social History:    Reviewed history from 08/31/2008 and no changes required:       Started smoking again in March 2010- 5-6 cigs per day.  Review of Systems       The patient complains of shortness of breath with activity, productive cough, and coughing up blood.  The patient denies shortness of breath at rest, non-productive  cough, chest pain, irregular heartbeats, acid heartburn, indigestion, loss of appetite, weight change, abdominal pain, difficulty swallowing, sore throat, tooth/dental problems, headaches, nasal congestion/difficulty breathing through nose, sneezing, itching, ear ache, anxiety, depression, hand/feet swelling, joint stiffness or pain, rash, change in color of mucus, and fever.    Physical Exam  Additional Exam:  Amb depressed wf NAD,  active hemoptysis  at least two cups bright red blood  HEENT: nl dentition, turbinates, and orophanx. Nl external ear canals without cough reflex, NM red/swollen w/ clear d/ch, max tenderness.  Neck without JVD/Nodes/TM Lungs clear to A and P bilaterally without cough on insp or exp maneuvers RRR no s3 or murmur or increase in P2. No edema Abd soft and benign with nl excursion in the supine position. No bruits or organomegaly Ext warm without calf tenderness, cyanosis clubbing  Skin warm and dry without lesions .   Sodium (NA)                              138               135-145          mEq/L  Potassium (K)                            3.4        l      3.5-5.1          mEq/L  Chloride                                 104               96-112           mEq/L  CO2                                      29                19-32            mEq/L  Glucose                                  94  70-99            mg/dL  BUN                                      12                6-23             mg/dL  Creatinine                               0.64              0.4-1.2          mg/dL  GFR, Est Non African American            >60               >60              mL/min  GFR, Est African American                >60               >60              mL/min    Oversized comment, see footnote  1  Calcium                                  9.2               8.4-10.5         mg/dL WBC                                      7.2               4.0-10.5         K/uL  RBC                                       4.60              3.87-5.11        MIL/uL  Hemoglobin (HGB)                         14.3              12.0-15.0        g/dL  Hematocrit (HCT)                         40.8              36.0-46.0        %  MCV                                      88.6              78.0-100.0       fL  MCHC  35.2              30.0-36.0        g/dL  RDW                                      13.0              11.5-15.5        %  Platelet Count (PLT)                     214               150-400          K/uL CXR:  patchy infiltrate RUL  Impression & Recommendations:  Problem # 1:  HEMOPTYSIS (ICD-786.3) Assessment Deteriorated Originally felt to be upper airway origin and epistaxsis ,  now with clear RUL infilt that was not present before.  plan: CTA chest angio FOB later today cough suppressants Her updated medication list for this problem includes:    Calcium 500 500 Mg Tabs (Calcium carbonate) .Marland Kitchen... As directed    Multivitamins Tabs (Multiple vitamin) .Marland Kitchen... Take 1 tablet by mouth once a day    Vitamin C 500 Mg Tabs (Ascorbic acid) .Marland Kitchen... Take 1 tablet by mouth once a day    Doxycycline Monohydrate 100 Mg Caps (Doxycycline monohydrate) ..... One twice daily    Prednisone 10 Mg Tabs (Prednisone) .Marland KitchenMarland KitchenMarland KitchenMarland Kitchen 4 each am x 2days, 2x2days, 1x2days and stop

## 2010-06-04 NOTE — Miscellaneous (Signed)
Summary: [facility] pervisit/prep  Clinical Lists Changes  Medications: Added new medication of MOVIPREP 100 GM  SOLR (PEG-KCL-NACL-NASULF-NA ASC-C) As per prep instructions. - Signed Rx of MOVIPREP 100 GM  SOLR (PEG-KCL-NACL-NASULF-NA ASC-C) As per prep instructions.;  #1 x 0;  Signed;  Entered by: Eual Fines RN;  Authorized by: Hilarie Fredrickson MD;  Method used: [id number] [address] [place], [place], [place]  [address], Ph: [phone], Fax: [phone] Observations: Added new observation of NKA: T (10/17/2008 7:55)    Prescriptions: MOVIPREP 100 GM  SOLR (PEG-KCL-NACL-NASULF-NA ASC-C) As per prep instructions.  #1 x 0   Entered by:   Eual Fines RN   Authorized by:   Hilarie Fredrickson MD   Signed by:   Eual Fines RN on 10/17/2008   Method used:   [facility]        [facility]  [address] [id number]       [address]       [place], [place]  [address]       Ph: [phone]       Fax: [phone]   RxID:   [id number]

## 2010-06-04 NOTE — Letter (Signed)
Summary: Recall Colonoscopy Letter  [facility]  [address] [place], [place] [address]   Phone: [phone]  Fax: [phone]      July 07, 2008 MRN: [id number]   [name] [address] [place], [place]  [address]   Dear [name],   According to your medical record, it is time for you to schedule a Colonoscopy. The American Cancer Society recommends this procedure as a method to detect early colon cancer. Patients with a family history of colon cancer, or a personal history of colon polyps or inflammatory bowel disease are at increased risk.  This letter has beeen generated based on the recommendations made at the time of your procedure. If you feel that in your particular situation this may no longer apply, please contact our office.  Please call our office at [phone] to schedule this appointment or to update your records at your earliest convenience.  Thank you for cooperating with [place] to provide you with the very best care possible.   Sincerely,  Hilarie Fredrickson, M.D.  [facility] [phone]

## 2010-06-04 NOTE — Letter (Signed)
Summary: Patient Notice- Polyp Results  [facility]  [address] [place], [place] [address]   Phone: [phone]  Fax: [phone]        November 01, 2008 MRN: [id number]    [name] [address] [place], [place]  [address]    Dear [name],  I am pleased to inform you that the colon polyps removed during your recent colonoscopy were found to be benign (no cancer detected) upon pathologic examination.  I recommend you have a repeat colonoscopy examination in 3 years to look for recurrent polyps, as having colon polyps increases your risk for having recurrent polyps or even colon cancer in the future.  Should you develop new or worsening symptoms of abdominal pain, bowel habit changes or bleeding from the rectum or bowels, please schedule an evaluation with either your primary care physician or with me.  Additional information/recommendations:  __ No further action with gastroenterology is needed at this time. Please      follow-up with your primary care physician for your other healthcare      needs.    Please call us if you are having persistent problems or have questions about your condition that have not been fully answered at this time.  Sincerely,  Hilarie Fredrickson MD  This letter has been electronically signed by your physician.

## 2010-06-04 NOTE — Assessment & Plan Note (Signed)
Summary: congestion///kp   PCP:  [name]  Chief Complaint:  sinus pressure, head congestion with yellow mucous, PND with yellow mucous in cough, sore throat x4 days - was given hydrocodine syrup at UC last year, and would like a refill.  History of Present Illness: 55 year old white female former smoker with a history of hyperlipidemia   5/09--with a greater than one-year history of numbness in the feet greater than the hands which she previously denied had a definite pattern but today tells me he only comes on in cold weather it is gone in warm weather.    June 07, 2008-- Presents for 1 week of nasal congestion, sinsu pain. pressure, ear fullness, teeth pain, drainage, cough, severe cough at times. Thick yellow mucous, malaise. sinus pressure, head congestion with yellow mucous, PND with yellow mucous in cough, sore throat x4 days - was given hydrocodine syrup at UC last year, would like a refill. Denies chest pain, dyspnea, orthopnea, hemoptysis, fever, n/v/d, edema, recent travel or antibiotics.        Prior Medications Reviewed Using: Patient Recall  Updated Prior Medication List: BAYER LOW STRENGTH 81 MG  TBEC (ASPIRIN) take one tab by mouth once daily OMEPRAZOLE 20 MG  CPDR (OMEPRAZOLE) take one tab by mouth once daily VIVELLE-DOT 0.1 MG/24HR  PTTW (ESTRADIOL) use as directed each monday and friday VYTORIN 10-80 MG  TABS (EZETIMIBE-SIMVASTATIN) take 1/2 tab by mouth once daily DIETHYLPROPION HCL CR 75 MG  TB24 (DIETHYLPROPION HCL) take one tab by mouth once daily as needed MIRALAX   POWD (POLYETHYLENE GLYCOL 3350) once daily ULTRAM 50 MG  TABS (TRAMADOL HCL) take one tab every four hours as needed AFRIN NASAL SPRAY 0.05 %  SOLN (OXYMETAZOLINE HCL) as needed CALCIUM 500 500 MG  TABS (CALCIUM CARBONATE) as directed MULTIVITAMINS   TABS (MULTIPLE VITAMIN) Take 1 tablet by mouth once a day MUCINEX DM 30-600 MG  TB12 (DEXTROMETHORPHAN-GUAIFENESIN) every 12 hours as needed ADVIL  COLD/SINUS 30-200 MG  TABS (PSEUDOEPHEDRINE-IBUPROFEN) as needed HYDROCODONE-HOMATROPINE 5-1.5 MG/5ML SYRP (HYDROCODONE-HOMATROPINE) 1 teaspoon every 4-6 hours as needed for cough VITAMIN C 500 MG  TABS (ASCORBIC ACID) Take 1 tablet by mouth once a day  Current Allergies (reviewed today): No known allergies  Past Medical History:    Reviewed history from 06/02/2007 and no changes required:              Hx of COUGH (ICD-786.2)       GERD (ICD-530.81)       POLYP, COLON (ICD-211.3)       RHINITIS, CHRONIC (ICD-472.0)       HYPERLIPIDEMIA (ICD-272.4)          Family History:    Reviewed history from 06/25/2007 and no changes required:       Coronary Heart Disease female < 63 tather       Osteoporsis and emphysema, mother  Social History:    Reviewed history from 06/25/2007 and no changes required:       quit smoking march 01   Risk Factors: Tobacco use:  quit    Year quit:  1999    Pack-years:  33 years  1 to 2 packs  Family History Risk Factors:    Family History of MI in males < 94 years old:  yes  Review of Systems      See HPI  Vital Signs:  Patient Profile:   55 Years Old Female Height:     61.4 inches Weight:  132.25 pounds O2 Sat:      100 % O2 treatment:    Room Air Temp:     98.7 degrees F oral Pulse rate:   98 / minute BP sitting:   104 / 80  (left arm) Cuff size:   regular  Vitals Entered By: [name] CNA (June 07, 2008 9:10 AM)             Is Patient Diabetic? No Comments Medications reviewed with patient [name] CNA  June 07, 2008 9:11 AM     Physical Exam  Afeb with normal vital signs HEENT: nl dentition, turbinates, and orophanx. Nl external ear canals without cough reflex, NM red/swollen w/ clear d/ch, max tenderness.  Neck without JVD/Nodes/TM Lungs clear to A and P bilaterally without cough on insp or exp maneuvers RRR no s3 or murmur or increase in P2 Abd soft and benign with nl excursion in the supine position.  No bruits or organomegaly Ext warm without calf tenderness, cyanosis clubbing or edema her pedal pulses are distant but present.  Her feet are cool.    Skin warm and dry without lesions       Impression & Recommendations:  Problem # 1:  SINUSITIS, ACUTE (ICD-461.9) Augmentin 875mg  two times a day for 10 days w/ food Mucinex DM two times a day as needed cough/congestion May use Advil cold and sinus as needed sinus congesiton/aches Hydrocodone cough syrup as needed for severe cough, may make you sleepy. Avoid alcohol.  Please contact office for sooner follow up if symptoms do not improve or worsen  follow up Dr. [name] in 1 month for CPX Her updated medication list for this problem includes:    Afrin Nasal Spray 0.05 % Soln (Oxymetazoline hcl) .Marland Kitchen... As needed    Mucinex Dm 30-600 Mg Tb12 (Dextromethorphan-guaifenesin) ..... Every 12 hours as needed    Advil Cold/sinus 30-200 Mg Tabs (Pseudoephedrine-ibuprofen) .Marland Kitchen... As needed    Hydrocodone-homatropine 5-1.5 Mg/19ml Syrp (Hydrocodone-homatropine) .Marland Kitchen... 1 teaspoon every 4-6 hours as needed for cough    Hydrocodone-homatropine 5-1.5 Mg/67ml Syrp (Hydrocodone-homatropine) .Marland Kitchen... 1 tsp every 4-6 hr as needed cough    Augmentin 875-125 Mg Tabs (Amoxicillin-pot clavulanate) .Marland Kitchen... 1 by mouth two times a day  Orders: Est. Patient Level III ([address])   Medications Added to Medication List This Visit: 1)  Hydrocodone-homatropine 5-1.5 Mg/34ml Syrp (Hydrocodone-homatropine) .Marland Kitchen.. 1 teaspoon every 4-6 hours as needed for cough 2)  Vitamin C 500 Mg Tabs (Ascorbic acid) .... Take 1 tablet by mouth once a day 3)  Hydrocodone-homatropine 5-1.5 Mg/44ml Syrp (Hydrocodone-homatropine) .Marland Kitchen.. 1 tsp every 4-6 hr as needed cough 4)  Augmentin 875-125 Mg Tabs (Amoxicillin-pot clavulanate) .Marland Kitchen.. 1 by mouth two times a day  Complete Medication List: 1)  Bayer Low Strength 81 Mg Tbec (Aspirin) .... Take one tab by mouth once daily 2)  Omeprazole 20 Mg Cpdr (Omeprazole)  .... Take one tab by mouth once daily 3)  Vivelle-dot 0.1 Mg/24hr Pttw (Estradiol) .... Use as directed each monday and friday 4)  Vytorin 10-80 Mg Tabs (Ezetimibe-simvastatin) .... Take 1/2 tab by mouth once daily 5)  Diethylpropion Hcl Cr 75 Mg Tb24 (Diethylpropion hcl) .... Take one tab by mouth once daily as needed 6)  Miralax Powd (Polyethylene glycol 3350) .... Once daily 7)  Ultram 50 Mg Tabs (Tramadol hcl) .... Take one tab every four hours as needed 8)  Afrin Nasal Spray 0.05 % Soln (Oxymetazoline hcl) .... As needed 9)  Calcium 500 500  Mg Tabs (Calcium carbonate) .... As directed 10)  Multivitamins Tabs (Multiple vitamin) .... Take 1 tablet by mouth once a day 11)  Mucinex Dm 30-600 Mg Tb12 (Dextromethorphan-guaifenesin) .... Every 12 hours as needed 12)  Advil Cold/sinus 30-200 Mg Tabs (Pseudoephedrine-ibuprofen) .... As needed 13)  Hydrocodone-homatropine 5-1.5 Mg/80ml Syrp (Hydrocodone-homatropine) .Marland Kitchen.. 1 teaspoon every 4-6 hours as needed for cough 14)  Vitamin C 500 Mg Tabs (Ascorbic acid) .... Take 1 tablet by mouth once a day 15)  Hydrocodone-homatropine 5-1.5 Mg/22ml Syrp (Hydrocodone-homatropine) .Marland Kitchen.. 1 tsp every 4-6 hr as needed cough 16)  Augmentin 875-125 Mg Tabs (Amoxicillin-pot clavulanate) .Marland Kitchen.. 1 by mouth two times a day  Patient Instructions: 1)  Augmentin 875mg  two times a day for 10 days w/ food 2)  Mucinex DM two times a day as needed cough/congestion 3)  May use Advil cold and sinus as needed sinus congesiton/aches 4)  Hydrocodone cough syrup as needed for severe cough, may make you sleepy. Avoid alcohol.  5)  Please contact office for sooner follow up if symptoms do not improve or worsen  6)  follow up Dr. [name] in 1 month for CPX Prescriptions: AUGMENTIN 875-125 MG TABS (AMOXICILLIN-POT CLAVULANATE) 1 by mouth two times a day  #20 x 0   Entered and Authorized by:   Rubye Oaks NP   Signed by:   Tammy Parrett NP on 06/07/2008   Method used:   Electronically to         [facility] [address] [id number] ([facility])       [address]       [place], [place]  [address]       Ph: [phone] or [phone]       Fax: [phone]   RxID:   [id number] HYDROCODONE-HOMATROPINE 5-1.5 MG/5ML SYRP (HYDROCODONE-HOMATROPINE) 1 tsp every 4-6 hr as needed cough  #8 oz x 0   Entered and Authorized by:   Rubye Oaks NP   Signed by:   Tammy Parrett NP on 06/07/2008   Method used:   Print then Give to Patient   RxID:   [id number]

## 2010-06-04 NOTE — Assessment & Plan Note (Signed)
Summary: Primary svc/ fu ov   Primary Provider/Referring Provider:  [name]  CC:  [name]. [name]..  History of Present Illness: 55 yowf active smoker with h/o hyperlipidemia.  August 31, 2008 new onset hempotysis assoc with epistaxis started without antecedent uri x 24 hours, abrupt onset,     Rx  1)  mucinex dm 2 every 12 hours and add tramadol 50 mg up to 2 every 4 hours to suppress the urge to cough. Swallowing water or using ice chips/non mint and menthol containing candies (such as lifesavers or sugarless jolly ranchers) are also effective.  2)  Doxycyclinine 100mg  twice daily x 10 days 3)  Prednisone 10 4 each am x 2days, 2x2days, 1x2days and stop  4)  Bedrest for 72 hours and no smoking  5)  no aspirin or flonase or advil cold and sinus 6)  no smoking   Sep 02, 2008  to ED with recurrent hemoptysis.  At least 2 cups of blood bright red. admit thru May 4 with FOB 5/3 clot over rul  with neg cytology.  Sep 07, 2008 ov cc osb, mild sense of chest congestion, minimal old blood coughed up x 24 hours.   Sep 14, 2008--ov  follow up and med review. Last visit Advair 250/50 added and prilosec increased two times a day. Over last week, less coughing, w/ less old blood mixed in clear mucous. Starting to feel better, w/ improved energy. No smoking since discharge. Meds are correct w/ list.   Sep 22, 2008 no more hempoptysis, no nose bleeds. no cp, back to baseline.  Kimberly denies any significant sore throat, nasal congestion or excess secretions, fever, chills, sweats, unintended wt loss, pleuritic or exertional cp, orthopnea pnd or leg swelling.  Kimberly also denies any obvious fluctuation in symptoms with weather or environmental change or other alleviating or aggravating factors.     Kimberly denies any significant sore throat, nasal congestion , fever, chills, sweats, unintended weight loss, pleurtic or exertional chest pain, orthopnea PND, or leg swelling Kimberly denies any  increase in rescue therapy over baseline, denies waking up needing it or having any early am or nocturnal exacerbations of coughing/wheezing/or dyspnea.   Current Medications (verified): 1)  Multivitamins   Tabs (Multiple Vitamin) .... Take 1 Tablet By Glover Once A Day 2)  Vitamin C 500 Mg  Tabs (Ascorbic Acid) .... Take 1 Tablet By Glover Once A Day 3)  Omeprazole 20 Mg  Cpdr (Omeprazole) .... Take One 30-60 Min Before First and Last Meals of The Day 4)  Calcium 500 500 Mg  Tabs (Calcium Carbonate) .... 2 Daily 5)  Vivelle-Dot 0.1 Mg/24hr  Pttw (Estradiol) .... Use As Directed Each Monday and Friday 6)  Vytorin 10-80 Mg  Tabs (Ezetimibe-Simvastatin) .... Take 1/2 Tab By Glover Once Daily 7)  Advair Diskus 250-50 Mcg/dose  Misc (Fluticasone-Salmeterol) .... One Puff Twice Daily 8)  Diethylpropion Hcl Cr 75 Mg Xr24h-Tab (Diethylpropion Hcl) .... Take 1 Tablet By Glover Daily As Needed 9)  Fluocinonide 0.05 % Soln (Fluocinonide) .... Apply As Needed 10)  Ultram 50 Mg  Tabs (Tramadol Hcl) .... Take One Tab Every Four Hours As Needed For Cough or Pain 11)  Alprazolam 0.5 Mg Tbdp (Alprazolam) .... One Every 6 Hours If Needed For Nerves or Breathessless 12)  Miralax   Powd (Polyethylene Glycol 3350) .... Once Daily 13)  Mucinex Dm 30-600 Mg  Tb12 (Dextromethorphan-Guaifenesin) .Marland Kitchen.. 1-2 Every 12 Hours  For Congestion or Cough Every 12 Hours 14)  Afrin Nasal Spray 0.05 %  Soln (Oxymetazoline Hcl) .... As Needed  Allergies (verified): No Known Drug Allergies  Past History:  Past Medical History:    Reviewed history from 09/14/2008 and no changes required:    Hx of COUGH (ICD-786.2)    Hemoptysis/epistaxis August 31, 2008 .............................................Marland KitchenWert        - FOB 09/04/2008 clot lodged over rul orifice, CT chest: no  mass neg PE    GERD (ICD-530.81)        -  Neg EGD 5/99        -  Neg 24h ph study 9/99    POLYP, COLON  (ICD-211.3)............................................................Marland KitchenMarina Goodell         - Colonoscopy 07/07/03          -  Recall letter reviewed  with Kimberly from 07/06/08 on July 12, 2008     RHINITIS, CHRONIC (ICD-472.0)    HYPERLIPIDEMIA (ICD-272.4)        - Target LDL < 130 (pos fm hx, pos smoking hx)    HEALTH MAINTENANCE..................................................................Marland KitchenWert         -  dt  2/06         -  pneumovax 2/07         -  DEXA nl 08/18/06         -  CPX  July 12, 2008        Vital Signs:  Patient profile:   55 year old female Height:      61.5 inches Weight:      129.13 pounds O2 Sat:      100 % Temp:     98.1 degrees F oral Pulse rate:   98 / minute BP sitting:   100 / 62  (right arm) Cuff size:   regular  Vitals Entered By: Carron Curie CMA (Sep 22, 2008 3:55 PM)  O2 Sat at Rest %:  100 O2 Flow:  room air CC: [name]. [name]. Comments Medications reviewed with patient Carron Curie CMA  Sep 22, 2008 3:55 PM    Physical Exam  Additional Exam:  Amb depressed/anxious  wf NAD wt 128 > 129 Sep 22, 2008  HEENT: nl dentition, turbinates,  sev aphthos ulcers left buchal mucosa. Nl external ear canals without cough reflex, NM pale  w/ clear d/ch,    Neck without JVD/Nodes/TM Lungs CTA no wheezing or crackles. RRR no s3 or murmur or increase in P2. No edema Abd soft and benign with nl excursion in the supine position. No bruits or organomegaly Ext without clubbing, cyanosis, calf tenderness   CXR  Procedure date:  09/22/2008  Findings:      atx and infiltrates cleared  Impression & Recommendations:  Problem # 1:  HEMOPTYSIS (ICD-786.3)  resolved but needs fu fob to clear the rul where there was too much clot to exclude early neoplasm.  Discussed in detail all the  indications, usual  risks and alternatives  relative to the benefits with patient who agrees to proceed with bronchoscopy 6/16  Problem #  2:  GERD (ICD-530.81)  Her updated medication list for this problem includes:    Omeprazole 20 Mg Cpdr (Omeprazole) .Marland Kitchen... Take one 30-60 min before first and last meals of the day  Orders: Est. Patient Level III (60454)  Medications Added to Medication List This Visit: 1)  Kenalog Aers (triamcinolone Acetonide) in [place]  .... Apply four times  a day  Other Orders: T-2 View CXR (71020TC)  Patient Instructions: 1)  See calendar for specific medication instructions and bring it back for each and every office visit for every healthcare provider you see.  Without it,  you may not receive the best quality medical care that we feel you deserve. 2)  we will you June 14 to schedule the bronchoscopy on the 16th 3)  Please schedule a Glover appointment in 2 months. Prescriptions: ALPRAZOLAM 0.5 MG TBDP (ALPRAZOLAM) one every 6 hours if needed for nerves or breathessless  #60 x 0   Entered and Authorized by:   Nyoka Cowden MD   Signed by:   Nyoka Cowden MD on 09/22/2008   Method used:   Print then Give to Patient   RxID:   [id number] ADVAIR DISKUS 250-50 MCG/DOSE  MISC (FLUTICASONE-SALMETEROL) One puff twice daily  #1 x 11   Entered and Authorized by:   Nyoka Cowden MD   Signed by:   Nyoka Cowden MD on 09/22/2008   Method used:   Print then Give to Patient   RxID:   [id number] KENALOG  AERS (TRIAMCINOLONE ACETONIDE) IN ORABASE apply four times a day  #5 gm x 11   Entered and Authorized by:   Nyoka Cowden MD   Signed by:   Nyoka Cowden MD on 09/22/2008   Method used:   Print then Give to Patient   RxID:   [id number]

## 2010-06-04 NOTE — Progress Notes (Signed)
Summary: prescription issue  Phone Note Call from Patient Call back at [phone]   Caller: Patient Call For: wert Summary of Call: Pt states that her vytorin 10-80mg  rx was written for 34 pills and she had to pay two co-pays, she says it should have been written for 30 pills and needs to have this rx changed accordingly, pls advise.//[facility] [address]. Initial call taken by: Darletta Moll,  February 05, 2010 9:29 AM  Follow-up for Phone Call        Called and spoke with pharmacy-changed the qty to #30 for a month; also called patient and informed her of this change so she will not have to pay 2 copays any longer.Reynaldo Minium CMA  February 05, 2010 11:30 AM

## 2010-06-04 NOTE — Procedures (Signed)
Summary: Colonoscopy   Colonoscopy  Procedure date:  10/31/2008  Findings:      Location:  [facility].    Procedures Next Due Date:    Colonoscopy: 11/2011  OLONOSCOPY PROCEDURE REPORT  PATIENT:  [name], [name]  MR#:  [id number] BIRTHDATE:   12/19/1955, 53 yrs. old   GENDER:   female  ENDOSCOPIST:   Wilhemina Bonito. Eda Keys, MD Referred by: Surveillance Program Recall,  PROCEDURE DATE:  10/31/2008 PROCEDURE:  Colonoscopy with snare polypectomy ASA CLASS:   Class II INDICATIONS: history of pre-cancerous (adenomatous) colon polyps (INDEX EXAM 07-2003 - one small adenoma)  MEDICATIONS:    Fentanyl 75 mcg IV, Versed 8 mg IV  DESCRIPTION OF PROCEDURE:   After the risks benefits and alternatives of the procedure were thoroughly explained, informed consent was obtained.  Digital rectal exam was performed and revealed no abnormalities.   The LB [id number] endoscope was introduced through the anus and advanced to the cecum, which was identified by both the appendix and ileocecal valve, without limitations. TIME TO CECUM = 6:31 MIN The quality of the prep was excellent, using MoviPrep.  The instrument was then withdrawn (TIME = 13:42 MIN) as the colon was fully examined. <<PROCEDUREIMAGES>>                <<OLD IMAGES>>  FINDINGS:  There were multiple (6) polyps identified and removed in the ascending colon (5) and the tranverse colon (1). All Polyps were <51mm and snared without cautery. Retrieval was successful.  Severe diverticulosis was found throughout the colon.   Retroflexed views in the rectum revealed no abnormalities.    The scope was then withdrawn from the patient and the procedure completed.  COMPLICATIONS:   None  ENDOSCOPIC IMPRESSION:  1) Polyps, multiple (6) - removed  2) Severe diverticulosis throughout the colon   RECOMMENDATIONS:  1) Follow up colonoscopy in 3 years (multiple polyps)   _______________________________ Wilhemina Bonito. Eda Keys,  MD  CC: Nyoka Cowden, MD;  The Patient     REPORT OF SURGICAL PATHOLOGY   Case #: [id number] Patient Name: [name]. Office Chart Number:  [id number]   MRN: [id number] Pathologist: Alden Server A. Delila Spence, MD DOB/Age  Oct 01, 1955 (Age: 56)    Gender: F Date Taken:  10/31/2008 Date Received: 10/31/2008   FINAL DIAGNOSIS   ***MICROSCOPIC EXAMINATION AND DIAGNOSIS***   COLON, TRANSVERSE, ASCENDING AND TRANSVERSE COLON POLYPS:  ADENOMATOUS POLYPS (5).  NO HIGH GRADE DYSPLASIA OR INVASIVE MALIGNANCY IDENTIFIED.   jy Date Reported:  11/01/2008     Alden Server A. Delila Spence, MD *** Electronically Signed Out By EAA ***    November 01, 2008 MRN: [id number]    [facility] [address] [place], [place]  [address]    Dear [name],  I am pleased to inform you that the colon polyps removed during your recent colonoscopy were found to be benign (no cancer detected) upon pathologic examination.  I recommend you have a repeat colonoscopy examination in 3 years to look for recurrent polyps, as having colon polyps increases your risk for having recurrent polyps or even colon cancer in the future.  Should you develop new or worsening symptoms of abdominal pain, bowel habit changes or bleeding from the rectum or bowels, please schedule an evaluation with either your primary care physician or with me.  Additional information/recommendations:  __ No further action with gastroenterology is needed at this time. Please      follow-up with your primary care physician for  your other healthcare      needs.    Please call us if you are having persistent problems or have questions about your condition that have not been fully answered at this time.  Sincerely,  Hilarie Fredrickson MD  This letter has been electronically signed by your physician.   Signed by Hilarie Fredrickson MD on 11/01/2008 at 5:29 PM  This report was created from the original endoscopy report, which was reviewed and signed by the  above listed endoscopist.

## 2010-06-04 NOTE — Assessment & Plan Note (Signed)
Summary: MED CALENDAR///KP   Primary Provider/Referring Provider:  [name]  CC:  Pt is here for a med calendar.Marland Kitchen  History of Present Illness: 29 yowf active smoker with h/o hyperlipidemia.  August 31, 2008 new onset hempotysis assoc with epistaxis started without antecedent uri x 24 hours, abrupt onset,     Rx  1)  mucinex dm 2 every 12 hours and add tramadol 50 mg up to 2 every 4 hours to suppress the urge to cough. Swallowing water or using ice chips/non mint and menthol containing candies (such as lifesavers or sugarless jolly ranchers) are also effective.  2)  Doxycyclinine 100mg  twice daily x 10 days 3)  Prednisone 10 4 each am x 2days, 2x2days, 1x2days and stop  4)  Bedrest for 72 hours and no smoking  5)  no aspirin or flonase or advil cold and sinus 6)  no smoking  Sep 02, 2008  to ED with recurrent hemoptysis.  At least 2 cups of blood bright red. admit thru May 4 with FOB 5/3 clot over rul  with neg cytology.  Sep 07, 2008 ov cc osb, mild sense of chest congestion, minimal old blood coughed up x 24 hours.   Sep 14, 2008--Presents for follow up and med review. Last visit Advair 250/50 added and prilosec increased two times a day. Over last week, less coughing, w/ less old blood mixed in clear mucous. Starting to feel better, w/ improved energy. No smoking since discharge. Meds are correct w/ list. Denies chest pain, dyspnea, orthopnea,  , fever, n/v/d, edema, headache. Pt denies any significant sore throat, nasal congestion , fever, chills, sweats, unintended weight loss, pleurtic or exertional chest pain, orthopnea PND, or leg swelling Pt denies any increase in rescue therapy over baseline, denies waking up needing it or having any early am or nocturnal exacerbations of coughing/wheezing/or dyspnea.   Allergies: No Known Drug Allergies  Past History:  Past Medical History:    Hx of COUGH (ICD-786.2)    Hemoptysis/epistaxis August 31, 2008  .............................................Marland KitchenWert        - FOB 09/04/2008 clot lodged over rul orifice, CT chest: no  mass neg PE    GERD (ICD-530.81)        -  Neg EGD 5/99        -  Neg 24h ph study 9/99    POLYP, COLON (ICD-211.3)............................................................Marland KitchenMarina Goodell         - Colonoscopy 07/07/03          -  Recall letter reviewed  with pt from 07/06/08 on July 12, 2008     RHINITIS, CHRONIC (ICD-472.0)    HYPERLIPIDEMIA (ICD-272.4)        - Target LDL < 130 (pos fm hx, pos smoking hx)    HEALTH MAINTENANCE..................................................................Marland KitchenWert         -  dt  2/06         -  pneumovax 2/07         -  DEXA nl 08/18/06         -  CPX  July 12, 2008        Review of Systems      See HPI  Vital Signs:  Patient profile:   55 year old female Height:      61.5 inches Weight:      128.50 pounds BMI:     23.97 O2 Sat:      96 % Temp:     97.0 degrees F oral  Pulse rate:   91 / minute BP sitting:   114 / 72  (left arm) Cuff size:   regular  Vitals Entered By: Arman Filter LPN (Sep 14, 2008 8:58 AM)  O2 Sat on room air at rest %:  96 CC: Pt is here for a med calendar. Comments Medications reviewed with patient Arman Filter LPN  Sep 14, 2008 8:58 AM    Physical Exam  Additional Exam:  Amb depressed/anxious  wf NAD HEENT: nl dentition, turbinates, and orophanx. Nl external ear canals without cough reflex, NM pale  w/ clear d/ch,    Neck without JVD/Nodes/TM Lungs CTA no wheezing or crackles. RRR no s3 or murmur or increase in P2. No edema Abd soft and benign with nl excursion in the supine position. No bruits or organomegaly Ext warm without calf tenderness, cyanosis clubbing  Skin warm and dry without lesions .    X-ray  Procedure date:  09/07/2008  Findings:      DG CHEST 2 VIEW - [id number]   Clinical Data: Short of breath, cough, congestion   CHEST - 2 VIEW   Comparison: Chest x-ray of 09/04/2008     Findings:  There is slightly more opacity at the right lung base which is consistent with worsening of the right upper lobe pneumonia as well as apparent partial right middle lobe collapse. The left lung is clear.  No effusion is seen. The heart is within normal limits in size. There are degenerative changes throughout the thoracic spine.   IMPRESSION: Some worsening of the anterior right upper lobe pneumonia with partial collapse of the right middle lobe.  Impression & Recommendations:  Problem # 1:  HEMOPTYSIS (ICD-786.3)   Resolved with retained old blood clots may be causing mild airways obstruction and inflammation.  Improving w/ Advair 250/50 one twice daily. and cough suppressants.  follow up 1 week w/ CXR.      Her updated medication list for this problem includes:    Multivitamins Tabs (Multiple vitamin) .Marland Kitchen... Take 1 tablet by mouth once a day    Calcium 500 500 Mg Tabs (Calcium carbonate) .Marland Kitchen... 2 daily    Vitamin C 500 Mg Tabs (Ascorbic acid) .Marland Kitchen... Take 1 tablet by mouth once a day    Advair Diskus 250-50 Mcg/dose Misc (Fluticasone-salmeterol) ..... One puff twice daily    Doxycycline Monohydrate 100 Mg Caps (Doxycycline monohydrate) ..... One twice daily    Prednisone 10 Mg Tabs (Prednisone) .Marland KitchenMarland KitchenMarland KitchenMarland Kitchen 4 each am x 2days, 2x2days, 1x2days and stop  Orders: Est. Patient Level III (54098)  Problem # 2:  SMOKER (ICD-305.1) pt has quit, encouraged on smoking cesstation  Problem # 3:  RHINITIS, CHRONIC (ICD-472.0)  saline nasal rinse as needed   Orders: Est. Patient Level III (11914)  Complete Medication List: 1)  Omeprazole 20 Mg Cpdr (Omeprazole) .... Take one 30-60 min before first and last meals of the day 2)  Vivelle-dot 0.1 Mg/24hr Pttw (Estradiol) .... Use as directed each monday and friday 3)  Vytorin 10-80 Mg Tabs (Ezetimibe-simvastatin) .... Take 1/2 tab by mouth once daily 4)  Miralax Powd (Polyethylene glycol 3350) .... Once daily 5)  Multivitamins Tabs  (Multiple vitamin) .... Take 1 tablet by mouth once a day 6)  Calcium 500 500 Mg Tabs (Calcium carbonate) .... 2 daily 7)  Vitamin C 500 Mg Tabs (Ascorbic acid) .... Take 1 tablet by mouth once a day 8)  Advair Diskus 250-50 Mcg/dose Misc (Fluticasone-salmeterol) .... One puff twice daily 9)  Ultram  50 Mg Tabs (Tramadol hcl) .... Take one tab every four hours as needed for cough or pain 10)  Afrin Nasal Spray 0.05 % Soln (Oxymetazoline hcl) .... As needed 11)  Mucinex Dm 30-600 Mg Tb12 (Dextromethorphan-guaifenesin) .Marland Kitchen.. 1-2 every 12 hours for congestion or cough every 12 hours 12)  Alprazolam 0.5 Mg Tbdp (Alprazolam) .... One every 6 hours if needed for nerves or breathessless 13)  Doxycycline Monohydrate 100 Mg Caps (Doxycycline monohydrate) .... One twice daily 14)  Prednisone 10 Mg Tabs (Prednisone) .... 4 each am x 2days, 2x2days, 1x2days and stop  Patient Instructions: 1)  follow up next week Dr. [name]  2)  Follow med sheet for cough/congestion control.  3)  Continue on Advair 250/50mg  1 puff two times a day , rinse and gargle after use.  4)  Bring med list to each visit.  5)  Saline nasal rinse/spray as needed  6)  Please contact office for sooner follow up if symptoms do not improve or worsen  Prescriptions: OMEPRAZOLE 20 MG  CPDR (OMEPRAZOLE) Take one 30-60 min before first and last meals of the day  #60 x 11   Entered and Authorized by:   Rubye Oaks NP   Signed by:   Tammy Parrett NP on 09/14/2008   Method used:   [facility] to        [id number]       [address]       [place], [place]  [address]       Ph: [phone]       Fax: [phone]   RxID:   [id number] VYTORIN 10-80 MG  TABS (EZETIMIBE-SIMVASTATIN) take 1/2 tab by mouth once daily  #30 Tablet x 11   Entered and Authorized by:   Rubye Oaks NP   Signed by:   Tammy Parrett NP on 09/14/2008   Method used:   [facility] to        [facility] [address]  [id number]       [address]       [place], [place]  [address]       Ph: [phone]       Fax: [phone]   RxID:   [id number]   Appended Document: MED CALENDAR///KP    Clinical Lists Changes  Medications: Added new medication of DIETHYLPROPION HCL CR 75 MG XR24H-TAB (DIETHYLPROPION HCL) take 1 tablet by mouth daily as needed Added new medication of FLUOCINONIDE 0.05 % SOLN (FLUOCINONIDE) apply as needed Removed medication of DOXYCYCLINE MONOHYDRATE 100 MG  CAPS (DOXYCYCLINE MONOHYDRATE) one twice daily Removed medication of PREDNISONE 10 MG  TABS (PREDNISONE) 4 each am x 2days, 2x2days, 1x2days and stop

## 2010-06-04 NOTE — Assessment & Plan Note (Signed)
Summary: Acute NP office visit - fluid in ears   Primary Provider/Referring Provider:  [name]  CC:  fluid in both ears that occ causes dizziness.  pt was last seen 2-23 for sinus inf and given omnicef and but pt states ears have not improved and still has slight sinus pressure and clear dainage.  History of Present Illness: 1 yowf active smoker with h/o hyperlipidemia and chronic bronchitis   August 31, 2008 new onset hempotysis assoc with epistaxis started without antecedent uri x 24 hours, abrupt onset,    rx as aecopd .  Sep 02, 2008  to ED with recurrent hemoptysis.  At least 2 cups of blood bright red. admit thru May 4 with FOB 5/3 clot over rul  with neg cytology.  Sep 07, 2008 ov cc osb, mild sense of chest congestion, minimal old blood coughed up x 24 hours.   Sep 14, 2008--ov  follow up and med review. Last visit Advair 250/50 added and prilosec increased two times a day. Over last week, less coughing, w/ less old blood mixed in clear mucous. Starting to feel better, w/ improved energy.  10/18/08 neg fob  November 15, 2008 ov smoking 1-2 per day vs 10 per day.   May 08, 2009 Acute visit.  Pt c/o trouble sleeping x 2 months.  Pt states that she takes xanax to help her sleep and this helps her fall asleep but she does not stay asleep.  She wakes up about every hour.  Pt also started smoking again- 5 cigs per day. no more hemoptysis.  lots of stress re family/work.  February 23, 201--Presents for an acute office visit. Complains of  sinus pressure on right side, feels drianage in right ear x1week . Has some cough and congestion .OTC Not helping. Denies chest pain, orthopnea, hemoptysis, fever, n/v/d, edema, headache. She is still smoking.  July 20, 2009 --Returns for persistent symptoms. Complains of fluid in both ears that occ causes dizziness.  pt was last seen 2-23 for sinus inf and given omnicef, but pt states ears have not improved and still has slight sinus pressure and clear  dainage. She is better but feels like she hs fluid in ears, get lightheaded intermittently when turns head or leans back. no speech/visual changes. Denies chest pain, dyspnea, orthopnea, hemoptysis, fever, n/v/d, edema, headache, ext weakness.   Medications Prior to Update: 1)  Multivitamins   Tabs (Multiple Vitamin) .... Take 1 Tablet By Mouth Once A Day 2)  Vitamin C 500 Mg  Tabs (Ascorbic Acid) .... Take 1 Tablet By Mouth Once A Day 3)  Omeprazole 20 Mg  Cpdr (Omeprazole) .... Take  One 30-60 Min Before First Meal of The Day 4)  Calcium 500 500 Mg  Tabs (Calcium Carbonate) .... 2 Daily 5)  Vivelle-Dot 0.1 Mg/24hr  Pttw (Estradiol) .... Use As Directed Each Monday and Friday 6)  Vytorin 10-80 Mg  Tabs (Ezetimibe-Simvastatin) .... 1/2 At Bedtime 7)  Advair Diskus 250-50 Mcg/dose  Misc (Fluticasone-Salmeterol) .... One Puff Once Daily 8)  Miralax   Powd (Polyethylene Glycol 3350) .... Once Daily 9)  Diethylpropion Hcl Cr 75 Mg Xr24h-Tab (Diethylpropion Hcl) .... Take 1 Tablet By Mouth Daily As Needed 10)  Ultram 50 Mg  Tabs (Tramadol Hcl) .... Take One Tab Every Four Hours As Needed For Cough or Pain 11)  Alprazolam 0.5 Mg Tbdp (Alprazolam) .... One Every 6 Hours If Needed For Nerves or Breathessless 12)  Mucinex Dm 30-600 Mg  Tb12 (Dextromethorphan-Guaifenesin) .Marland Kitchen.. 1-2 Every 12 Hours For Congestion or Cough Every 12 Hours 13)  Afrin Nasal Spray 0.05 %  Soln (Oxymetazoline Hcl) .... As Needed 14)  Kenalog  Aers (Triamcinolone Acetonide) in Orabase .... Apply Four Times A Day 15)  Valtrex 500 Mg Tabs (Valacyclovir Hcl) .... 1/2 Once Daily 16)  Striant 30 Mg Misc (Testosterone) .... As Directed 3 Times A Wk 17)  Cefdinir 300 Mg Caps (Cefdinir) .Marland Kitchen.. 1 By Mouth Two Times A Day  Current Medications (verified): 1)  Multivitamins   Tabs (Multiple Vitamin) .... Take 1 Tablet By Mouth Once A Day 2)  Vitamin C 500 Mg  Tabs (Ascorbic Acid) .... Take 1 Tablet By Mouth Once A Day 3)  Omeprazole 20 Mg   Cpdr (Omeprazole) .... Take  One 30-60 Min Before First Meal of The Day 4)  Calcium 500 500 Mg  Tabs (Calcium Carbonate) .... 2 Daily 5)  Vivelle-Dot 0.1 Mg/24hr  Pttw (Estradiol) .... Use As Directed Each Monday and Friday 6)  Vytorin 10-80 Mg  Tabs (Ezetimibe-Simvastatin) .... 1/2 At Bedtime 7)  Advair Diskus 250-50 Mcg/dose  Misc (Fluticasone-Salmeterol) .... One Puff Once Daily 8)  Miralax   Powd (Polyethylene Glycol 3350) .... Once Daily 9)  Diethylpropion Hcl Cr 75 Mg Xr24h-Tab (Diethylpropion Hcl) .... Take 1 Tablet By Mouth Daily As Needed 10)  Ultram 50 Mg  Tabs (Tramadol Hcl) .... Take One Tab Every Four Hours As Needed For Cough or Pain 11)  Alprazolam 0.5 Mg Tbdp (Alprazolam) .... One Every 6 Hours If Needed For Nerves or Breathessless 12)  Mucinex Dm 30-600 Mg  Tb12 (Dextromethorphan-Guaifenesin) .Marland Kitchen.. 1-2 Every 12 Hours For Congestion or Cough Every 12 Hours 13)  Afrin Nasal Spray 0.05 %  Soln (Oxymetazoline Hcl) .... As Needed 14)  Kenalog  Aers (Triamcinolone Acetonide) in Orabase .... Apply Four Times A Day 15)  Valtrex 500 Mg Tabs (Valacyclovir Hcl) .... 1/2 Once Daily 16)  Striant 30 Mg Misc (Testosterone) .... As Directed 3 Times A Wk  Allergies (verified): No Known Drug Allergies  Past History:  Past Medical History: Last updated: 05/08/2009 Hx of COUGH (ICD-786.2) Hemoptysis/epistaxis August 31, 2008 ..............................................Marland KitchenWert     - FOB 09/04/2008 clot lodged over rul orifice, CT chest: no  mass neg PE     - FOB 10/18/08 negative GERD (ICD-530.81)     -  Neg EGD 5/99     -  Neg 24h ph study 9/99 POLYP, COLON (ICD-211.3)............................................................Marland KitchenMarina Goodell      - Colonoscopy 07/07/03       - Colonoscopy 10/31/08 :  1) Adenomatous Polyps, multiple (6) removed,  Severe diverticulosis  RHINITIS, CHRONIC (ICD-472.0) HYPERLIPIDEMIA (ICD-272.4)     - Target LDL < 130 (pos fm hx, pos smoking hx) HEALTH  MAINTENANCE..................................................................Marland KitchenWert      -  dt  2/06      -  pneumovax 2/07      -  DEXA nl 08/18/06      -  CPX  July 12, 2008     Past Surgical History: Last updated: 07/12/2008 sp bladder prolapse repair 11/08 vag hysterectomy 1989  Family History: Last updated: 08/31/2008 Coronary Heart Disease female < 55 tather Osteoporsis and emphysema, mother neg lung ca  Social History: Last updated: 06/27/2009 Started smoking again in March 2010- 3-4 cigs per day. occ alcohol married 2 children unemployed  Risk Factors: Smoking Status: current (06/27/2009) Packs/Day: 3-4cigs A DAY (06/27/2009)  Review of Systems      See  HPI  Vital Signs:  Patient profile:   55 year old female Height:      61.5 inches Weight:      135.38 pounds BMI:     25.26 O2 Sat:      99 % on Room air Temp:     98.5 degrees F oral Pulse rate:   78 / minute BP sitting:   126 / 76  (left arm) Cuff size:   regular  Vitals Entered By: [name] CNA (July 20, 2009 11:48 AM)  O2 Flow:  Room air CC: fluid in both ears that occ causes dizziness.  pt was last seen 2-23 for sinus inf and given omnicef, but pt states ears have not improved and still has slight sinus pressure and clear dainage Is Patient Diabetic? No Comments Medications reviewed with patient Daytime contact number verified with patient. [name] CNA  July 20, 2009 11:50 AM    Physical Exam  Additional Exam:  Amb   wf NAD wt  129 Sep 22, 2008 > 132 November 15, 2008 > 136 January 16, 2009 > 136 May 08, 2009>>135 March 18,  HEENT: nl dentition, turbinates,  external ear canals without cough reflex, NM pale  w/ clear d/ch,    Neck without JVD/Nodes/TM Lungs CTA no wheezing or crackles. RRR no s3 or murmur or increase in P2. No edema Abd soft and benign with nl excursion in the supine position. No bruits or organomegaly Ext without clubbing, cyanosis, calf tenderness Skin: new  tattoo on back   Impression & Recommendations:  Problem # 1:  RHINITIS, CHRONIC (ICD-472.0)  Mild flare, no sign of active infection. Encouraged on smoking cesstation REC:  Saline nasal rinses as needed  Zyrtec 10mg  once daily for 2 weeks then as needed nasal drainage.  Veramyst nasal 1 puff two times a day -take until sample is gone.  Please contact office for sooner follow up if symptoms do not improve or worsen   Orders: Est. Patient Level III ([address])  Complete Medication List: 1)  Multivitamins Tabs (Multiple vitamin) .... Take 1 tablet by mouth once a day 2)  Vitamin C 500 Mg Tabs (Ascorbic acid) .... Take 1 tablet by mouth once a day 3)  Omeprazole 20 Mg Cpdr (Omeprazole) .... Take  one 30-60 min before first meal of the day 4)  Calcium 500 500 Mg Tabs (Calcium carbonate) .... 2 daily 5)  Vivelle-dot 0.1 Mg/24hr Pttw (Estradiol) .... Use as directed each monday and friday 6)  Vytorin 10-80 Mg Tabs (Ezetimibe-simvastatin) .... 1/2 at bedtime 7)  Advair Diskus 250-50 Mcg/dose Misc (Fluticasone-salmeterol) .... One puff once daily 8)  Miralax Powd (Polyethylene glycol 3350) .... Once daily 9)  Diethylpropion Hcl Cr 75 Mg Xr24h-tab (Diethylpropion hcl) .... Take 1 tablet by mouth daily as needed 10)  Ultram 50 Mg Tabs (Tramadol hcl) .... Take one tab every four hours as needed for cough or pain 11)  Alprazolam 0.5 Mg Tbdp (Alprazolam) .... One every 6 hours if needed for nerves or breathessless 12)  Mucinex Dm 30-600 Mg Tb12 (Dextromethorphan-guaifenesin) .Marland Kitchen.. 1-2 every 12 hours for congestion or cough every 12 hours 13)  Afrin Nasal Spray 0.05 % Soln (Oxymetazoline hcl) .... As needed 14)  Kenalog Aers (triamcinolone Acetonide) in Orabase  .... Apply four times a day 15)  Valtrex 500 Mg Tabs (Valacyclovir hcl) .... 1/2 once daily 16)  Striant 30 Mg Misc (Testosterone) .... As directed 3 times a wk  Patient Instructions: 1)  Saline  nasal rinses as needed  2)  Zyrtec 10mg  once  daily for 2 weeks then as needed nasal drainage.  3)  Veramyst nasal 1 puff two times a day -take until sample is gone.  4)  Please contact office for sooner follow up if symptoms do not improve or worsen

## 2010-07-19 ENCOUNTER — Telehealth: Payer: Self-pay | Admitting: Internal Medicine

## 2010-07-23 NOTE — Progress Notes (Signed)
Summary: refill xanex--last seen 01/2010  Phone Note Call from Patient   Caller: Patient Call For: Taneia Mealor Summary of Call: pt [facility] [address] that they would fax request for refill of xanex and are waiting reply. pt cell [phone] Initial call taken by: Tivis Ringer, CNA,  July 19, 2010 10:34 AM  Follow-up for Phone Call        Pt was last seen 01/2010 and told to follow up in 3 months. No pending apts. LMOMTCB X1 Carver Fila  July 19, 2010 10:58 AM  Returning call can be reached at [phone].Darletta Moll  July 19, 2010 2:18 PM   spoke with pt and appt set for 08-19-10 at 9am. refill sent to last until appt. pt aware must keep appt for further refills. Carron Curie CMA  July 19, 2010 3:26 PM     Prescriptions: ALPRAZOLAM 0.5 MG TBDP (ALPRAZOLAM) one every 6 hours if needed for nerves or breathessless  #60 x 0   Entered by:   Carron Curie CMA   Authorized by:   Nyoka Cowden MD   Signed by:   Carron Curie CMA on 07/19/2010   Method used:   [facility]ph[facility] to ...       [facility]  [address] [id number]       [address]       [place], [place]  [address]       Ph: [phone]       Fax: [phone]   RxID:   [id number]

## 2010-08-13 LAB — APTT
aPTT: 25 seconds (ref 24–37)
aPTT: 29 seconds (ref 24–37)

## 2010-08-13 LAB — BASIC METABOLIC PANEL
BUN: 12 mg/dL (ref 6–23)
CO2: 29 mEq/L (ref 19–32)
Calcium: 9.2 mg/dL (ref 8.4–10.5)
Chloride: 104 mEq/L (ref 96–112)
Creatinine, Ser: 0.64 mg/dL (ref 0.4–1.2)
GFR calc Af Amer: >60 mL/min (ref >60)
GFR calc non Af Amer: >60 mL/min (ref >60)
Glucose, Bld: 94 mg/dL (ref 70–99)
Potassium: 3.4 mEq/L — ABNORMAL LOW (ref 3.5–5.1)
Sodium: 138 mEq/L (ref 135–145)

## 2010-08-13 LAB — CBC
HCT: 40.8 % (ref 36.0–46.0)
Hemoglobin: 14.3 g/dL (ref 12.0–15.0)
MCHC: 35.2 g/dL (ref 30.0–36.0)
MCV: 88.6 fL (ref 78.0–100.0)
Platelets: 214 10*3/uL (ref 150–400)
RBC: 4.6 MIL/uL (ref 3.87–5.11)
RDW: 13 % (ref 11.5–15.5)
WBC: 7.2 10*3/uL (ref 4.0–10.5)

## 2010-08-13 LAB — PROTIME-INR
INR: 1 (ref 0.00–1.49)
INR: 1 (ref 0.00–1.49)
Prothrombin Time: 12.9 seconds (ref 11.6–15.2)
Prothrombin Time: 13 seconds (ref 11.6–15.2)

## 2010-08-13 LAB — COMPREHENSIVE METABOLIC PANEL
ALT: 19 U/L (ref 0–35)
AST: 22 U/L (ref 0–37)
Albumin: 3.7 g/dL (ref 3.5–5.2)
Alkaline Phosphatase: 64 U/L (ref 39–117)
BUN: 14 mg/dL (ref 6–23)
CO2: 28 mEq/L (ref 19–32)
Calcium: 8.7 mg/dL (ref 8.4–10.5)
Chloride: 101 mEq/L (ref 96–112)
Creatinine, Ser: 0.62 mg/dL (ref 0.4–1.2)
GFR calc Af Amer: >60 mL/min (ref >60)
GFR calc non Af Amer: >60 mL/min (ref >60)
Glucose, Bld: 107 mg/dL — ABNORMAL HIGH (ref 70–99)
Potassium: 3.2 mEq/L — ABNORMAL LOW (ref 3.5–5.1)
Sodium: 135 mEq/L (ref 135–145)
Total Bilirubin: 0.5 mg/dL (ref 0.3–1.2)
Total Protein: 6.2 g/dL (ref 6.0–8.3)

## 2010-08-14 LAB — DIFFERENTIAL
Basophils Absolute: 0 10*3/uL (ref 0.0–0.1)
Basophils Relative: 0 % (ref 0–1)
Eosinophils Absolute: 0.2 10*3/uL (ref 0.0–0.7)
Eosinophils Relative: 3 % (ref 0–5)
Lymphocytes Relative: 23 % (ref 12–46)
Lymphs Abs: 1.5 10*3/uL (ref 0.7–4.0)
Monocytes Absolute: 0.4 10*3/uL (ref 0.1–1.0)
Monocytes Relative: 7 % (ref 3–12)
Neutro Abs: 4.3 10*3/uL (ref 1.7–7.7)
Neutrophils Relative %: 67 % (ref 43–77)

## 2010-08-14 LAB — POCT I-STAT, CHEM 8
BUN: 19 mg/dL (ref 6–23)
Calcium, Ion: 1.15 mmol/L (ref 1.12–1.32)
Chloride: 105 mEq/L (ref 96–112)
Creatinine, Ser: 1 mg/dL (ref 0.4–1.2)
Glucose, Bld: 80 mg/dL (ref 70–99)
HCT: 42 % (ref 36.0–46.0)
Hemoglobin: 14.3 g/dL (ref 12.0–15.0)
Potassium: 3.9 mEq/L (ref 3.5–5.1)
Sodium: 139 mEq/L (ref 135–145)
TCO2: 27 mmol/L (ref 0–100)

## 2010-08-14 LAB — CBC
HCT: 40.7 % (ref 36.0–46.0)
Hemoglobin: 14.2 g/dL (ref 12.0–15.0)
MCHC: 34.9 g/dL (ref 30.0–36.0)
MCV: 89.5 fL (ref 78.0–100.0)
Platelets: 215 10*3/uL (ref 150–400)
RBC: 4.55 MIL/uL (ref 3.87–5.11)
RDW: 13.3 % (ref 11.5–15.5)
WBC: 6.3 10*3/uL (ref 4.0–10.5)

## 2010-08-16 ENCOUNTER — Encounter: Payer: Self-pay | Admitting: Internal Medicine

## 2010-08-16 ENCOUNTER — Telehealth: Payer: Self-pay | Admitting: Internal Medicine

## 2010-08-16 NOTE — Telephone Encounter (Signed)
Dr. [name] please advise if okay for pt to have her cholesterol checked on Monday before office visit or after OV. . Pt has an apt w/ you on 08/19/10 at 9:00. Please advise. Thanks  Carver Fila, CMA

## 2010-08-16 NOTE — Telephone Encounter (Signed)
She is due a cpx p May 31 according to our records.  We could pick labs to do based on specific problems but then we'll have to repeat the others at the cpx so I rec we do it all at once p May 31.

## 2010-08-16 NOTE — Telephone Encounter (Signed)
lmomtcb x1 

## 2010-08-16 NOTE — Telephone Encounter (Signed)
Pt aware and will wait for labs to be done at [facility] in May.

## 2010-08-19 ENCOUNTER — Ambulatory Visit (INDEPENDENT_AMBULATORY_CARE_PROVIDER_SITE_OTHER): Payer: BLUE CROSS/BLUE SHIELD | Admitting: Internal Medicine

## 2010-08-19 ENCOUNTER — Encounter: Payer: Self-pay | Admitting: Internal Medicine

## 2010-08-19 VITALS — BP 122/74 | HR 80 | Temp 98.0°F | Ht 62.0 in | Wt 132.0 lb

## 2010-08-19 DIAGNOSIS — J449 Chronic obstructive pulmonary disease, unspecified: Secondary | ICD-10-CM

## 2010-08-19 DIAGNOSIS — R042 Hemoptysis: Secondary | ICD-10-CM

## 2010-08-19 DIAGNOSIS — R059 Cough, unspecified: Secondary | ICD-10-CM

## 2010-08-19 DIAGNOSIS — R05 Cough: Secondary | ICD-10-CM

## 2010-08-19 DIAGNOSIS — K219 Gastro-esophageal reflux disease without esophagitis: Secondary | ICD-10-CM

## 2010-08-19 DIAGNOSIS — J310 Chronic rhinitis: Secondary | ICD-10-CM

## 2010-08-19 MED ORDER — ALPRAZOLAM 0.5 MG PO TABS: ORAL_TABLET | ORAL | Status: DC

## 2010-08-19 MED ORDER — MOMETASONE FURO-FORMOTEROL FUM 100-5 MCG/ACT IN AERO: INHALATION_SPRAY | RESPIRATORY_TRACT | Status: DC

## 2010-08-19 MED ORDER — MONTELUKAST SODIUM 10 MG PO TABS: 10.0000 mg | ORAL_TABLET | Freq: Every day | ORAL | Status: DC

## 2010-08-19 NOTE — Assessment & Plan Note (Signed)
I reviewed the Flethcher curve with patient that basically indicates  if you quit smoking when your best day FEV1 is still well preserved it is highly unlikely you will progress to severe disease and informed the patient there was no medication on the market that has proven to change the curve or the likelihood of progression.  Therefore stopping smoking and maintaining abstinence is the most important aspect of care, not choice of inhalers or for that matter, doctors.    At this point should be ok off advair and use dulera as "rescue"  The proper method of use, as well as anticipated side effects, of this metered-dose inhaler are discussed and demonstrated to the patient.  This improved to 50% with coaching

## 2010-08-19 NOTE — Assessment & Plan Note (Signed)
Explained natural history of uri/ allergic triggesrs and why it's necessary in patients at risk to treat GERD aggressively  at least  short term   to reduce risk of evolving cyclical cough initially  triggered by epithelial injury and a heightened sensitivty to the effects of any upper airway irritants,  most importantly acid - related.  That is, the more sensitive the epithelium damaged for virus, the more the cough, the more the secondary reflux (especially in those prone to reflux) the more the irritation of the sensitive mucosa and so on in a cyclical pattern.

## 2010-08-19 NOTE — Assessment & Plan Note (Signed)
Classic Upper airway cough syndrome, so named because it's frequently impossible to sort out how much is  CR/sinusitis with freq throat clearing (which can be related to primary GERD)   vs  causing  secondary (" extra esophageal")  GERD from wide swings in gastric pressure that occur with throat clearing, often  promoting self use of mint and menthol lozenges that reduce the lower esophageal sphincter tone and exacerbate the problem further in a cyclical fashion.   These are the same pts who not infrequently have failed to tolerate ace inhibitors,  dry powder inhalers or biphosphonates or report having reflux symptoms that don't respond to standard doses of PPI , and are easily confused as having aecopd or asthma flares,   For now rx with gerd, stop DPI in favor of hfa

## 2010-08-19 NOTE — Progress Notes (Signed)
Subjective:    Patient ID: [name], female    DOB: [birth year], 55 y.o.   MRN: [id number]  HPI   12  yowf active smoker until 01/03/10  with h/o hyperlipidemia and chronic bronchitis   August 31, 2008 new onset hempotysis assoc with epistaxis started without antecedent uri x 24 hours, abrupt onset, rx as aecopd .   Sep 02, 2008 to ED with recurrent hemoptysis. At least 2 cups of blood bright red. admit thru May 4 with FOB 5/3 clot over rul with neg cytology.   10/18/08 neg fob   May 31 , 2011 cpx rec med calendar > scheduled end of Sept     September 29, 201ov/ NP:  med review. Had PFT today which showed FEV1 at 2.55L/m (112% of predicted).  rec stay off cigs/ keep up with calendar.  08/19/2010 ov/[name]:  No calendar, didn't recognize our copy. C/o very hoarse dry cough x 1 week, no purulent sputum but def flare of nasal symptoms on singulair x 2 months started back on her own.  Also using nasonex. No overt hb.  Pt denies any significant sore throat, dysphagia, itching, sneezing,   excess/ purulent nasal secretions,  fever, chills, sweats, unintended wt loss, pleuritic or exertional cp, hempoptysis, orthopnea pnd or leg swelling.    Also denies any obvious fluctuation of symptoms with weather or environmental changes or other aggravating or alleviating factors.        Past Surgical History:    sp bladder prolapse repair 11/08  vag hysterectomy 1989   Family History:    Coronary Heart Disease female < 28 tather  Osteoporsis and emphysema, mother  neg lung ca  melanoma in dad  Social History:   occ alcohol  married  2 children  Works for [employer]  Former smoker- states that she quit on Sept 1st 2011    Past Medical History:  Hemoptysis/epistaxis August 31, 2008 ..............................................Marland KitchenWert  - FOB 09/04/2008 clot lodged over rul orifice, CT chest: no mass neg PE  - FOB 10/18/08 negative  COPD/ recurrent bronchitis FEV1 2.55 l/m , (112%, ratio of  66--January 31, 2010)  GERD (ICD-530.81)  - Neg EGD 5/99  - Neg 24h ph study 9/99  POLYP, COLON (ICD-211.3)............................................................Marland KitchenMarina Glover  - Colonoscopy 07/07/03  - Colonoscopy 10/31/08 : 1) Adenomatous Polyps, multiple (6) removed, Severe diverticulosis  RHINITIS, CHRONIC (ICD-472.0)  HYPERLIPIDEMIA (ICD-272.4)  - Target LDL < 130 (pos fm hx, pos smoking hx)  HEALTH MAINTENANCE.......................................................................Marland KitchenWert  - dt 2/06  - pneumovax 2/07  - DEXA nl 08/18/06  - CPX Oct 02, 2009          [name] January 31, 2010 9:52 AM  Physical Exam        Review of Systems     Objective:   Physical Exam     Amb very anxious wf NAD very hoarse with harsh upper airway cough wt 129 Sep 22, 2008 >  134 January 15, 2010 >>   > 132/ 08/19/2010  HEENT: nl dentition, turbinates, external ear canals without cough reflex, NM pale w/ clear d/ch,  Neck without JVD/Nodes/TM  Lungs CTA no wheezing or crackles.  RRR no s3 or murmur or increase in P2. No edema  Abd soft and benign with nl excursion in the supine position. No bruits or organomegaly no cvat  Ext without clubbing, cyanosis, calf tenderness .  Skin: warm and dry,no rash.  Pulses sym both feet     Assessment & Plan:

## 2010-08-19 NOTE — Patient Instructions (Addendum)
Stop advair but ok to resume singulair for now taken one each evening   Start dulera 100  Take 2 puffs first thing in am and then another 2 puffs about 12 hours later.  Work on inhaler technique:  relax and gently blow all the way out then take a nice smooth deep breath back in, triggering the inhaler at same time you start breathing in.  Hold for up to 5 seconds if you can.  Rinse and gargle with water when done   If your mouth or throat starts to bother you,   I suggest you time the inhaler to your dental care and after using the inhaler(s) brush teeth and tongue with a baking soda containing toothpaste and when you rinse this out, gargle with it first to see if this helps your mouth and throat.     Ok to resume aspirin 81 mg once daily  CPX due after Oct 03 2010

## 2010-09-17 NOTE — Op Note (Signed)
NAMESELEENA, [name]                ACCOUNT NO.:  [id number]   MEDICAL RECORD NO.:  [id number]          PATIENT TYPE:  INP   LOCATION:                               FACILITY:  [facility]   PHYSICIAN:  Casimiro Needle B. [name], MD, FCCPDATE OF BIRTH:  1955/08/05   DATE OF PROCEDURE:  09/04/2008  DATE OF DISCHARGE:                               OPERATIVE REPORT   PROCEDURE:  Fiberoptic bronchoscopy with lavage.   HISTORY AND INDICATIONS:  Please see handwritten notes.  This is a 55-  year-old white female smoker with new-onset hemoptysis, possibly from  the right upper lobe, although CT scan did not show any evidence of  endobronchial abnormalities and suggested instead vague airspace disease  which was consistent with aspirated blood.   The procedure was performed in the bronchoscopy suite.  The patient  agreed to the procedure after full discussion of risks, benefits, and  alternatives.  She was then given a total of 75 mg IV Demerol and 5 mg  IV Versed for adequate sedation and cough suppression.   Using the standard flexible fiberoptic bronchoscopy, the left naris was  easily cannulated with good visualization of the entire oropharynx and  larynx.  There did not appear to be any blood at all above the level of  the cords.   Using additional 1% lidocaine as needed, the entire tracheobronchial  tree was explored bilaterally with the following findings:  1. The trachea, carina, and all the major airways opened widely to the      subsegmental level except for the right upper lobe.  Here, there      was a tenacious bloody clot that could only be partially lavaged      off the airway that appeared to obstruct most of the right upper      lobe to the point where the bronchoscope could not be passed into      any of the right upper lobe orifices.   There were additional bloody secretions throughout all the airways that  were suctioned free.  The only remaining blood at the end of the  procedure was  a clot over the right upper lobe.   Because of the risk of dislodging this clot, it was decided to lavage it  gently and turn the right upper lobe lavage in for cytology, but to come  back in 2 weeks to see if this is a healing ulceration or whether  neoplasm is present.   If the bleeding recurs massively, the next step would be thoracic  surgery evaluation for rigid bronchoscopy evaluation and control of  bleeding acutely.  She is a candidate at this point for right upper  lobectomy if necessary.  These results were discussed of her husband.  The patient tolerated the  procedure well and will be kept in the hospital another 24 hours to  observe for recurrent bleeding and then discharge home.   FINAL DIAGNOSIS:  Hemoptysis from the right upper lobe orifice, unclear  etiology.      Charlaine Dalton. [name], MD, [facility]  Electronically Signed  MBW/MEDQ  D:  09/04/2008  T:  09/04/2008  Job:  [id number]

## 2010-09-17 NOTE — Assessment & Plan Note (Signed)
[facility]                                 ON-CALL NOTE   [name], [name]                       MRN:          [id number]  DATE:08/30/2008                            DOB:          [birth year]    PRIMARY CARE PHYSICIAN:  Charlaine Dalton. [name], MD, [facility]   The patient called at approximately 1810 hours complaining of hemoptysis  and epistaxis.  This had started recently after having nerve conduction  studies and EMG.  She is followed by Dr. [name] for hypercholesterolemia.  The bleeding sounded frequently enough that she needed to be examined by  medical providers.  I instructed the patient to go to the emergency room  either at [facility] or [facility].     [name], MD  Electronically Signed    Clemetine Marker  DD: 08/30/2008  DT: 08/31/2008  Job #: [id number]

## 2010-09-17 NOTE — Op Note (Signed)
[name], [name]                ACCOUNT NO.:  [id number]   MEDICAL RECORD NO.:  [id number]          PATIENT TYPE:  OIB   LOCATION:  9312                          FACILITY:  [facility]   PHYSICIAN:  Martina Sinner, MD DATE OF BIRTH:  Jan 05, 1956   DATE OF PROCEDURE:  02/11/2007  DATE OF DISCHARGE:  02/12/2007                               OPERATIVE REPORT   PREOPERATIVE DIAGNOSIS:  Vault prolapse, cystocele, rectocele.   POSTOPERATIVE DIAGNOSIS:  Vault prolapse, cystocele, rectocele.   SURGERY:  Vaginal vault prolapse repair plus cystocele repair plus graft  plus cystoscopy (surgeon Dr. Alfredo Martinez, assistant Dr. Marcelle Overlie); rectocele repair (surgeon Dr. Marcelle Overlie, assistant Dr.  Alfredo Martinez).  Ms. [name] has symptomatic prolapse.  She  required an A and P repair plus graft plus vault prolapse repair.   PROCEDURE IN DETAIL:  The patient was prepped and draped in the usual  fashion.  Extra care was taken to minimize the risk of compartment  syndrome neuropathy when positioning her legs.  Her vaginal vault  descended 3 or 4 cm under anesthesia.   I initially placed two 3-0 Vicryl at the cuff dimples.  I then made T-  shaped anterior vaginal wall incision and sharply dissected the vaginal  wall mucosa from the pubocervical fascia to the white line bilaterally.  I initially instilled approximately 15 mL of a lidocaine epinephrine  mixture.  I then did an anterior repair with two layers of running 2-0  Vicryl suture.  I then cystoscoped the patient and there was no  distortion of the ureteral orifices.  There is no injury to the bladder.  There were good ureteral jets that were blue on cystoscopy.   I sharply and bluntly dissected back to the ischial spines bilaterally.  On the sacrospinous ligament approximately 1 cm medial to the ischial  spine I placed a zero Ethibond using a catheter device.  Using a UR5  needle on a zero Vicryl suture I placed the  suture near the  urethrovesical angle bilaterally in the pelvic sidewall.   A 10 x 6 dermal graft was soaked in antibiotic and saline.  It was cut  in the shape of a trapezoid.  It was sutured to the four sutures as  dictated.   I then trimmed less than 1 cm of vaginal wall mucosa bilaterally.  I  closed the anterior vaginal wall with running 2-0 Vicryl on a CT1  needle.   There was excellent support of vaginal cuff and reduction of the  cystocele.   Dr. Vincente Poli then performed a rectocele repair and I assisted her.  This  will be dictated by Dr. Vincente Poli.          ______________________________  Martina Sinner, MD  Electronically Signed    SAM/MEDQ  D:  03/09/2007  T:  03/10/2007  Job:  [id number]

## 2010-09-17 NOTE — Discharge Summary (Signed)
[name], [name]                ACCOUNT NO.:  [id number]   MEDICAL RECORD NO.:  [id number]          PATIENT TYPE:  INP   LOCATION:  5007                         FACILITY:  [facility]   PHYSICIAN:  Casimiro Needle B. [name], MD, FCCPDATE OF BIRTH:  1956-02-20   DATE OF ADMISSION:  09/02/2008  DATE OF DISCHARGE:  09/05/2008                               DISCHARGE SUMMARY   DISCHARGE DIAGNOSES:  1. Hemoptysis.  2. Right upper lobe infiltrates/suspected pneumonia.   BEST PRACTICE:  The patient was placed on Protonix for stress ulcer  prevention and SCDs while in bed.  Subcutaneous heparin was held  secondary to hemoptysis.   KEY EVENTS/STUDIES:  1. Chest x-ray from Sep 02, 2008 reveals acute right upper lobe      infiltrates, suspicious for pneumonia.  2. COPD.  CT angio of the chest with contrast revealed negative for      acute PE or thoracic aortic dissection and an anterior right upper      lobe pneumonia.  On Sep 04, 2008, the patient underwent fiberoptic      bronchoscopy which revealed a blood clot at the right upper lobe      that could not be easily dislodged.   HISTORY OF PRESENT ILLNESS:  [name] is a 55 year old white female,  former smoker, who presented to [facility] with 24 hours of bloody  sputum and productive cough with trace amounts of blood and thick mucus.  She denied chest pain, dyspnea, weight loss and nocturnal sweats.  She  was admitted inpatient and a CT angio of the chest was performed to rule  out PE as well as chest x-ray which revealed a possible right upper lobe  pneumonia.  She subsequently underwent fiberoptic bronchoscopy on Sep 04, 2008, which revealed a blood clot that was not easily dislodged for  which she tolerated well.   The patient was seen on April 29, in the office for new-onset hemoptysis  from discharge medication list as she is not to continue Flonase nasal  spray at this time.   HOSPITAL COURSE:  1. Hemoptysis as per above.  The patient was  admitted to the floor,      underwent fiberoptic bronchoscopy with questionable underlying      etiology.  However she improved over 48 hours, will likely need a      repeat fiberoptic bronchoscopy in the next few weeks.  2. Right upper lobe infiltrate, suspected to be pneumonia.  She was      treated empirically with Rocephin and azithromycin and will be      continued on antibiotics this discharge for which she previously      had prescription from office visit on Sep 02, 2008.  She is to      continue her doxycycline until completed as well as prednisone      taper until completed.   DISCHARGE INSTRUCTIONS:  Diet:  Regular diet as tolerated.  Activity:  As tolerated.   DISCHARGE MEDICATIONS:  As per above.  The patient is to continue  previously prescribed doxycycline until completed as well  as prednisone  taper until completed.  Omeprazole 20 mg daily, calcium tablet daily,  Vivelle-Dot transdermal 0.1 mg on Monday and Friday, Vytorin 10/80 by  mouth at bedtime, Flonase nasal spray 1-2 puffs each nostril twice  daily, Valtrex 1 g daily, MiraLax daily, diethylpropion 25 mg daily.   At the time of discharge, it was discussed in depth with the patient  that she is to avoid medications containing aspirin as well as smoking  cessation for which she has agreed that she would like to use Nicorette  gum as she was given samples in the office.   FOLLOWUP:  The patient is scheduled for follow up visit with Dr. Sandrea Hughs on Sep 22, 2008 at 3:45.   DISPOSITION AT THE TIME OF DISCHARGE:  The patient has met maximum  benefit of inpatient therapy and is currently stable at the time of  discharge with no evidence of bleeding.      Canary Brim, NP      Charlaine Dalton. [name], MD, [facility]  Electronically Signed    BO/MEDQ  D:  09/05/2008  T:  09/06/2008  Job:  [id number]   cc:   Charlaine Dalton. [name], MD, FCCP

## 2010-09-17 NOTE — Op Note (Signed)
NAMEKEINA, [name]                ACCOUNT NO.:  [id number]   MEDICAL RECORD NO.:  [id number]          PATIENT TYPE:  AMB   LOCATION:  SDC                           FACILITY:  [facility]   PHYSICIAN:  Michelle L. Grewal, M.D.DATE OF BIRTH:  [birth year]   DATE OF PROCEDURE:  02/11/2007  DATE OF DISCHARGE:                               OPERATIVE REPORT   PREOPERATIVE DIAGNOSES:  1. Cystocele.  2. Vault prolapse.  3. Rectocele.   POSTOPERATIVE DIAGNOSES:  1. Cystocele.  2. Vault prolapse.  3. Rectocele.   PROCEDURES:  1. Anterior colporrhaphy with cystoscopy with placement of graft      anteriorly and vaginal vault suspension.  This was performed by      Alfredo Martinez, M.D., and will be dictated by him.  2. Posterior repair.  Surgeon for posterior repair Michelle L. Vincente Poli,      M.D., with Dr. Sherron Monday assisting.   ANESTHESIA:  General.   ESTIMATED BLOOD LOSS:  100 mL for entire procedure.   DRAINS:  Foley catheter.   PACKING:  Vaginal packing.   PROCEDURE:  The patient was given informed consent and was taken to the  operating room.  She was intubated without difficulty.  She was then  prepped and draped.  Dr. McDiarmid initially started with his procedure,  which will be dictated by him.  After he completely finished with his  portion of the surgery, then I became primary surgeon.  Exam under  anesthesia revealed that she had a grade 2 rectocele.  Allis clamps were  placed at the perineum in 5 and 7 o'clock positions.  A V-shaped  incision was made with the scalpel and a midline incision was made all  the way up the posterior vaginal wall using Metzenbaum scissors.  The  rectocele was reduced using sharp and blunt dissection.  Using 2-0  Vicryl suture, we then closed the rectocele using a series of  interrupteds.  The redundant vaginal epithelium was trimmed.  We then  closed the vaginal incision using 0 Vicryl in a running locked  stitch.  At the end of the procedure she  had excellent support and  suspension all around.  Vaginal packing with Estrace cream was inserted  to the vagina.  The patient was extubated and went to recovery room in  stable condition.  All sponge, lap and instrument counts were correct  x2.      Michelle L. Vincente Poli, M.D.  Electronically Signed     MLG/MEDQ  D:  02/11/2007  T:  02/11/2007  Job:  [id number]

## 2010-09-17 NOTE — Discharge Summary (Signed)
NAMECARLINDA, [name]                ACCOUNT NO.:  [id number]   MEDICAL RECORD NO.:  [id number]          PATIENT TYPE:  INP   LOCATION:  5007                         FACILITY:  [facility]   PHYSICIAN:  Casimiro Needle B. [name], MD, FCCPDATE OF BIRTH:  1955-12-18   DATE OF ADMISSION:  09/02/2008  DATE OF DISCHARGE:  09/05/2008                               DISCHARGE SUMMARY   FINAL DIAGNOSES:  1. New-onset hemoptysis.      a.     Bronchoscopy performed on Sep 04, 2008, consistent with a       blood clot over the right upper lobe orifice.      b.     CT scan showing no evidence of neoplasm in the right upper       lobe or elsewhere.  2. Rhinitis with intermittent epistaxis, may be the source of      aspirated blood.  3. Chronic anxiety.  4. Hyperlipidemia.  5. Active smoker, promises to quit.   HISTORY:  Please see the typed H and P on this 55 year old white female,  active smoker, who was admitted with hemoptysis after being seen in the  office 2 days prior to admission with a diagnosis of either rhinitis  with epistaxis or tracheobronchitis treated with doxycycline and  prednisone taper.  She is also given Mucinex DM and recommended stay in  bed.  Stop aspirin and Advil products, but came to the emergency room  with recurrent hemoptysis and dyspnea and was found to have vague right  upper lobe infiltrate, either pneumonia or aspirated blood.   She had no fever and her hemoptysis cleared over the first 24 hours of  admission.  Bronchoscopy was performed as noted above with no definitive  findings.  Cytology was turned and is still pending.   However, the patient has had no further bleeding and is therefore  improved and ready for discharge for outpatient followup.   The source of the blood remains undefined and she will need likely a  followup bronchoscopy to look at the right upper lobe orifice once the  clot had a chance to reabsorb.   Follow up will be arranged, therefore, in 2 weeks.  The  patient has  maintained a regular diet and can return to work on May 7.   MEDICATIONS AT THE TIME OF DISCHARGE:  All the medicines she was on, on  admission per the med rec sheet.  She will also finish her course of  doxycycline and prednisone as per her previous office visit.  She had  been cautioned against using Advil or aspirin products, and promises not  to smoke anymore.      Charlaine Dalton. [name], MD, [facility]  Electronically Signed    MBW/MEDQ  D:  09/05/2008  T:  09/06/2008  Job:  [id number]

## 2010-09-17 NOTE — Op Note (Signed)
[name], [name]                ACCOUNT NO.:  [id number]   MEDICAL RECORD NO.:  [id number]          PATIENT TYPE:  AMB   LOCATION:  CARD                         FACILITY:  [facility]   PHYSICIAN:  Casimiro Needle B. [name], MD, FCCPDATE OF BIRTH:  06-13-1955   DATE OF PROCEDURE:  10/19/2008  DATE OF DISCHARGE:  10/18/2008                               OPERATIVE REPORT   PROCEDURE:  Fiberoptic bronchoscopy, diagnostic.   HISTORY AND INDICATIONS:  Please see attached office records.  This  patient had previously undergone bronchoscopy for a massive hemoptysis  and found to have a clot in the right upper lobe orifice so that the  mucosa was not visible.  She agreed to repeat the procedure now that the  hemoptysis has resolved to take a better look at the right upper lobe  orifice to make sure there was not a mucosal process such as neoplasm  causing hemoptysis.  She has had no hemoptysis since the previous  evaluation, nor has there been any change in history or exam since the  office visit that is recorded in the attached notes.   A time-out was performed prior to the procedure.   DESCRIPTION OF PROCEDURE:  The patient agreed to procedure after full  discussion of risks, benefits, and alternatives.  She was monitored by  continuous surface ECG and oximetry and she received a total of 50 mcg  of fentanyl and 5 mg of Versed for adequate sedation and cough  suppression and 1% lidocaine by updraft nebulizer was used for adequate  topical anesthesia along with 2% lidocaine to the left naris.   Using a standard flexible video bronchoscope, the left naris was easily  cannulated with good visualization of entire pharynx and larynx.  The  cords moved normally.  There was no apparent upper airway.  There were  no apparent upper airway lesions.   Using additional 1% lidocaine as needed, the entire tracheobronchial  tree was explored with the following findings:  All the airways were wide open to the  subsegmental level with no  evidence of endobronchial abnormality.  The right upper lobe was  inspected twice carefully.  There was no evidence of any residual  mucosal abnormality nor any blood clots.   IMPRESSION:  Hemoptysis, resolved, with no evidence of endobronchial  source.  CT scan also showed no evidence of source.  This must have been  related to acute bronchitis from smoking which is now resolved.   RECOMMENDATIONS:  Follow-up in the office has already been arranged.   The patient tolerated well and other results were discussed with her  husband following the procedure.      Charlaine Dalton. [name], MD, [facility]  Electronically Signed    MBW/MEDQ  D:  10/19/2008  T:  10/19/2008  Job:  [id number]

## 2010-09-17 NOTE — Discharge Summary (Signed)
[name], [name]                ACCOUNT NO.:  [id number]   MEDICAL RECORD NO.:  [id number]          PATIENT TYPE:  AMB   LOCATION:  SDC                           FACILITY:  [facility]   PHYSICIAN:  Martina Sinner, MD DATE OF BIRTH:  10/18/1955   DATE OF ADMISSION:  02/11/2007  DATE OF DISCHARGE:                               DISCHARGE SUMMARY   PREOPERATIVE DIAGNOSES:  1. Wall prolapse.  2. Cystocele.  3. Rectocele.   OPERATION:  Vault suspension plus cystocele repair, plus graft (Primary  surgeon, Dr. Jonna Coup MacDiarmid; assistant Dr. Marcelino Duster L. Grewal) and  rectocele (surgeon, Dr. Marcelle Overlie; assistant Dr. Alfredo Martinez).   OPERATIVE INDICATIONS:  [name] had symptomatic prolapse.  Her  preoperative laboratory tests were normal.  She was given preoperative  antibiotics.  Extra care was taken when we positioned her leg to  minimize the risk of compartment syndrome neuropathy and DVT.  She has  been having some back trouble as well and this was taken into  consideration in positioning her.   OPERATIVE NARRATIVE:  Under anesthesia, she had a grade III cystocele  that has descended almost to the introitus.  A vaginal cuff descended  approximately 4 cm.  She had a grade II rectocele.  These tissues were  well estrogenized.   I used approximately 17 ml of a Marcaine/epinephrine mixture for  hemostasis and to develop the next plane.  I did a T shaped anterior  vaginal wall incision after marking the apex with a 3-0 Vicryl.  I  sharply and bluntly dissected the pubocervical fascia from the anterior  vaginal wall to the white lines bilaterally. I  could identify the  pelvic side wall near the original vesicle angle.  After doing the  anterior repair, I finger dissected back to the ischial spine easily  without causing bleeding.   There is no question she had a large central defect.  I was diligent in  going a 2 layer anterior repair, trying not to distort any anatomy  or  over imbricate.  I used running 2-0 Vicryl for this.  I did not encroach  upon the bladder neck or proximal urethra.   I then cystoscoped the patient.  She did have a camel hump indentation  in the midline from the anterior repair.  There was not distortion of  the ureteral orifice.  The ureteral orifices were just on each side of  the camel hump.  There were excellent jets bilaterally.  There was no  stitch within the bladder.   Using a Capio and a 0-ethibond, I placed the 0-Ethibond on the ischial  spine on both sides.  There was no bleeding.  Using A UR5 needle, I  placed a 2-0 Vicryl into the pelvic sidewall near the urethrovesical  angle.  I cut the 10 x 6 dermal graft after soaking in the supravent  trapezoid.  With the help of a free needle, I was able to bring all  sutures through the graft and it laid in beautifully, not under tension.  It covered the apex very nicely.  It did not encroach upon the proximal  urethra.   I trimmed a few mm of vaginal wall mucosa bilaterally.  I closed the  anterior vaginal wall after copious irrigation with antibiotic with  running 2-0 Vicryl on CTY needle.  Blood loss was less than 50 ml.   At the end of the case the catheter was draining copious blue urine.  I  was very pleased with repair.  The graft was in nice position.   Dr. Vincente Poli then performed the rectocele repair and this will be dictated  by her.   ESTIMATED BLOOD LOSS:  Total blood loss was lest than 50 ml.   INFILTRATION:  Good.   I was pleased with Ms. Clegg's surgery.  Hopefully it will reach her  treatment goal.           ______________________________  Martina Sinner, MD  Electronically Signed     SAM/MEDQ  D:  02/11/2007  T:  02/11/2007  Job:  [id number]

## 2010-09-17 NOTE — Assessment & Plan Note (Signed)
[facility]                         GASTROENTEROLOGY OFFICE NOTE   ASTOU, LADA                       MRN:          [id number]  DATE:08/04/2007                            DOB:          [birth year]    REFERRING PHYSICIAN:  Marcelino Duster L. Vincente Poli, M.D.   REFERRING PHYSICIAN:  Dr. Marcelle Overlie   REASON FOR CONSULTATION:  Abdominal pain/diverticulitis.   HISTORY OF PRESENT ILLNESS:  This is a 55 year old white female with a  history of dyslipidemia, adenomatous colon polyps, diverticulosis, prior  cholecystectomy, and lumbar laminectomy.  She is referred through the  courtesy of Dr. Vincente Poli regarding probable diverticulitis.  Patient was  evaluated at Dr. Lynnell Dike office, June 25, 2007, after having three  to four days of left lower quadrant pain.  The patient thought it was  ovarian in nature.  Dr. Vincente Poli thought it likely represented  diverticulitis.  Patient was placed on ciprofloxacin 500 mg p.o. b.i.d.  for what she believes was about ten days.  Thereafter, the pain resolved  entirely without recurrence.  Her bowel habits tend to be chronically  constipated.  She takes MiraLax daily, which helps tremendously.  No  other history, problems or complaints.  Her last complete colonoscopy  was performed July 07, 2003.  She was found to have pandiverticulosis,  as well as diminutive colon polyps, which were both hyperplastic and  adenomatous.  Followup in five years recommended.   PAST MEDICAL HISTORY:  As above.   PAST SURGICAL HISTORY:  Hysterectomy, appendectomy, bladder tack and  rectal tack surgery, back surgery,   Patient has no known drug allergies.   CURRENT MEDICATIONS:  1. Multivitamin.  2. Aspirin 81 mg daily.  3. Omeprazole 20 mg daily.  4. Calcium with vitamin D.  5. Vivelle Dot patch.  6. Vytorin 40/5 mg daily.  7. Flonase.  8. Valtrex.  9. Mira-Lax.  10.Mucinex.  11.Afrin.  12.Tramadol.  13.__________.  14.Diethylpropion 75 mg daily.   FAMILY HISTORY:  No family history of gastrointestinal malignancy.  There is a family history of colon polyps.   SOCIAL HISTORY:  Patient is married, two sons.  She is a Scientist, physiological,  former smoker, occasionally uses alcohol.   REVIEW OF SYSTEMS:  Per diagnostic evaluation form.   PHYSICAL EXAM:  Well-appearing female, in no acute distress.  She is  alert and oriented in no acute distress.  Her blood pressure is 110/60, heart rate 74, weight is 136.2 pounds, she  is 5 feet 2 inches in height.  HEENT:  Sclerae anicteric.  Conjunctivae are pink.  Oral mucosa is  intact.  No adenopathy.  LUNGS:  Clear.  HEART:  Regular.  ABDOMEN:  Soft without tenderness, mass or hernia.  Good bowel sounds  heard.  EXTREMITIES:  Without edema.   IMPRESSION:  1. Patient is a 55 year old female with known diverticulosis, who      presented five weeks ago with acute left lower quadrant pain,      consistent with diverticulitis.  She responded appropriately to      ciprofloxacin.  Currently asymptomatic.  2. Chronic constipation.  Well-controlled  with Mira-Lax.  3. History of adenomatous colon polyps.  Due for followup in March of      2010.   RECOMMENDATIONS:  1. Long discussion today on diverticulosis and diverticulitis.  In      addition, literature provided.  2. Continue MiraLax as needed for control of constipation.  3. Keep next year's anniversary date for surveillance colonoscopy.  4. Interval GI followup p.r.n.     Wilhemina Bonito. [name], MD  Electronically Signed    JNP/MedQ  DD: 08/04/2007  DT: 08/04/2007  Job #: [id number]   cc:   Charlaine Dalton. [name], MD, FCCP

## 2010-09-20 NOTE — Assessment & Plan Note (Signed)
[facility]                             PULMONARY OFFICE NOTE   NAME:[name], [name]                       MRN:          [id number]  DATE:06/18/2006                            DOB:          [birth year]    HISTORY:  This is a 55 year old white female, former smoker, complaining  of a cough off and on for the last 10 days with a sensation of congested  chest with no fevers, chills, sweats, orthopnea, PND, or leg swelling.  Although she has it in very specific detail how to treat herself on a  p.r.n. basis for cough, she has not followed any of the contingency  instructions that are listed on her user-friendly and very unambiguous  medication calendar, and I demonstrated that for her today.   She is also concerned about chronic numbness over the last several  months that kind of comes and goes in fingers more than toes, right  greater than left hand in a glove pattern with no specific association  with time of day or activity or whether she is working or at home.  She  denies any history of significant alcohol use, muscle weakness, or other  neurologic complaints, and has no history of diabetes.   PAST MEDICAL HISTORY:  1. Hyperlipidemia with goal LDL less than 130 because of positive      family history and smoking history.  2. Chronic rhinitis controlled with Flonase.  3. Colon polyps status post most recent colonoscopy 2005, in the      computer for recall in 2010.  4. Status post hysterectomy, to follow up with Dr. Roberto Scales yearly.  5. Status post lumbar laminectomy remotely.   ALLERGIES:  NONE KNOWN.   MEDICATIONS:  Recorded in detail on the worksheet column dated June 18, 2006.   SOCIAL HISTORY:  She quit smoking 6 years ago, she denies any alcohol  use.   FAMILY HISTORY:  Positive for heart disease in her father, onset at age  22.  Mother had osteoporosis, emphysema and a nerve problem and was a  smoker.  Sister has arthritis with  polyps.  No colon cancer in her  family to her knowledge.   REVIEW OF SYSTEMS:  Taken in detail on the worksheet and significant for  the problems outlined above.   PHYSICAL EXAMINATION:  This is a pleasant ambulatory white female in no  acute distress.  VITAL SIGNS:  Stable vital signs.  HEENT:  Ocular exam was done with limited funduscopy revealing no  obvious arterial change, ear canals clear bilaterally.  NECK:  Supple without cervical adenopathy or tenderness, carotid  upstrokes brisk without bruit.  CHEST:  Was completely clear bilaterally to auscultation and percussion.  Has a regular rhythm without murmur, gallop, rub present.  ABDOMEN:  Soft, benign, with no palpable organomegaly, or masses, or  tenderness.  EXTREMITIES:  Warm without calf tenderness, cyanosis, clubbing, edema.  Pedal pulses were present bilaterally.  NEUROLOGIC:  No focal deficits, motor, sensory or pathologic reflexes.  I thought her DTRs were perfectly normal.  SKIN:  Warm and dry with  no lesions.   EKG was normal.  Chemistry profile was normal.  CBC was normal.  Total  cholesterol was 174 with an LDL of 92, HDL of 63, and CRP was only 1.  Chest x-ray was normal.   IMPRESSION:  1. New onset cough in a patient with documented reflux and contingency      instructions already explicitly outlined for the patient for      increased cough to increase proton pump inhibitor therapy to b.i.d.      before meals.  She has not followed this instruction and so I have      asked her to do this before additional workup, and given her      instruction also on how to use Mucinex DM and Afrin more      aggressively on a p.r.n. basis as outlined on the medication      calendar.  2. Paresthesias involving the right greater than left hand, and feet,      hands great than feet is worrisome for new onset of polyneuropathy.      I could not find any risk factors for this, such as alcoholism or      diabetes.  For now, have  advised the patient just to monitor the      pattern more specifically to see if there is any progression, and      if so, where the symptom progresses for neurology input if needed.  3. Positive family history of depression noted.  4. Status post vaginal hysterectomy with serial followup with Dr.      Roberto Scales.  She is due for repeat bone densitometry in March of 2008      and I have scheduled her for this appointment at the time she      returns her followup.  5. General health maintenance.  She was updated on Tetanus in 2006,      Pneumovax 2007.  She needs to be much more consistent about aerobic      exercise if possible.  6. Hyperlipidemia, at target on Vytorin.  I did discuss the      implications of the recent studies which are too soon to call in      terms of making a change on Vytorin at this point.  I have asked      her to continue it as there is no evidence of an adverse effect,      and I believe she is certainly benefitting in terms of her      cholesterol profile.     Charlaine Dalton. Kimberly Sires, MD, [facility]  Electronically Signed    MBW/MedQ  DD: 06/19/2006  DT: 06/19/2006  Job #: [id number]

## 2010-09-20 NOTE — Assessment & Plan Note (Signed)
[facility]                               PULMONARY OFFICE NOTE   MALAI, LADY                       MRN:          [id number]  DATE:03/27/2006                            DOB:          27-Aug-1955    HISTORY:  A 55 year old white female with no previous history of any  significant GI problems.  Status post most recent colonoscopy July 07, 2003  indicating diverticulosis and benign polyps by Dr. [name].  She comes in  today acutely ill for the last week with diarrhea that started after she ate  her mother-in-law's spaghetti and has persisted since then despite taking  Imodium.  She added Pepto-Bismol yesterday and noticed her stools were black  this morning and came to the office concerned that she might be having  bleeding.  However, she denies any nausea, abdominal pain, cramping, fever,  chills, or unusual exposure history.  No one else has had diarrhea and she  denies any antibiotic exposure.   PAST MEDICAL HISTORY:  Significant for history of GERD as well.  Maintained  on PPI therapy with omeprazole but never had any trouble with diarrhea  before from it.   ALLERGIES:  None known.   MEDICATIONS:  Taken in detail on the work sheet, dated March 27, 2006,  which correlates with the medication calendar she carries with her.   SOCIAL HISTORY:  She quit smoking in 2001.  No unusual travel.   FAMILY HISTORY:  Significant for the absence of inflammatory bowel disease.   REVIEW OF SYSTEMS:  Negative except as outlined above.   PHYSICAL EXAMINATION:  GENERAL:  This is a pleasant, ambulatory, white  female who does not appear to be acutely ill or toxic.  VITAL SIGNS:  She has normal vital signs.  Pulse rate is only 91.  HEENT:  Unremarkable.  Oropharynx is clear.  NECK:  Supple without cervical adenopathy or tenderness.  CHEST:  Lung fields clear bilaterally.  HEART:  Regular rate rhythm without murmur, gallop or rub.  ABDOMEN:  Soft,  benign with no palpable organomegaly or masses or  tenderness.  EXTREMITIES:  Warm without calf tenderness, cyanosis or clubbing or edema.   IMPRESSION:  Acute onset diarrhea lasting over a week now of unclear  etiology in this patient with no history of any significant previous bowel  disease other than diverticulosis.  This is a bit long for food poisoning  and also for typical viral gastroenteritis.  She assures me she has not been  exposed to antibiotics and so it is unlikely to be TMC.   I recommended, therefore, Cipro 500 mg one b.i.d. for a five-day course in  case she was exposed to somehow to E coli from the spaghetti she ate and  recommended that she call Dr. [name] in 72 hours if not improved on this.   I also spent extra time educating her on optimal diet for diarrhea which  avoids the use of dairy products and fresh salads and vegetables until the  diarrhea is resolved.   The black stools were explained by the Pepto-Bismol.  There is no evidence  of any GI bleeding clinically.  I assured her the black stools were  secondary to the Pepto-Bismol, but I told her that she could continue to use  Pepto-Bismol p.r.n.     Charlaine Dalton. [name], MD, [facility]  Electronically Signed    MBW/MedQ  DD: 03/27/2006  DT: 03/27/2006  Job #: [id number]

## 2010-09-20 NOTE — Assessment & Plan Note (Signed)
[facility]                             PULMONARY OFFICE NOTE   LANISHA, STEPANIAN                       MRN:          [id number]  DATE:06/08/2006                            DOB:          20-Feb-1956    Ms. [name] is a 55 year old white woman with past medical history  significant for GERD and recurrent cough, who comes today to our clinic  complaining of what she says laryngitis.  She says she was partying this  weekend on Saturday and she was singing and after that she had lost her  voice.  Since then she has not been able to recover her voice and she a  little bit of a dry cough, and she is concerned about the fact that she  maybe not recover her voice.  She denies fever, chills, chest pain,  shortness of breath, hemoptysis.   MEDICATIONS:  Reviewed.   PAST MEDICAL HISTORY:  Reviewed.   PHYSICAL EXAMINATION:  VITAL SIGNS:  Weight is 130 pounds, temperature  97.6, blood pressure 112/66, pulse 61, oxygen saturation 99% on room  air.  HEENT:  She has dysphonia, but she is not in acute distress.  Ears are  within normal limits.  Nose is normal.  Pharynx is completely normal  with no exudate nor erythema.  NECK:  Supple.  No JVD.  LUNGS:  She has very good air movement with no wheezing, crackles.  HEART:  Regular rate and rhythm with no murmurs.  LOWER EXTREMITIES:  With no edema.   ASSESSMENT/PLAN:  Acute laryngitis, most probably multifactorial because  of voice abuse also probably she has some mild chronic inflammation  secondary to gastroesophageal reflux disease and also a viral infection  could be superimposed into this.  At this point the plan is voice rest,  humidifier, add ibuprofen twice a day two hours after her aspirin, also  she is to use her Mucinex DM for cough, and come back to our clinic as  needed.  And, also we advised her to increase her omeprazole to 20 mg by  mouth twice a day for 5-7 days and after that come back to  use it just  once a day.  For all her other chronic issues, to continue her regular  medications.      Dennis Bast, MD      [name]. Kimberly Sires, MD, [facility]  Electronically Signed   YC/MedQ  DD: 06/08/2006  DT: 06/08/2006  Job #: [id number]

## 2010-09-20 NOTE — Assessment & Plan Note (Signed)
[facility]                               PULMONARY OFFICE NOTE   NAME:[name], [name]                       MRN:          [id number]  DATE:01/30/2006                            DOB:          28-Aug-1955    HISTORY:  A 55 year old white female in for followup evaluation of chronic  cough that appears better controlled with treatment directed at rhinitis and  reflux with a combination of Flonase and omeprazole. On her own, she has  been able to taper the Flonase down to one at bedtime and omeprazole down to  one q. a.m., but has an extensive p.r.n. list of medicines (see medication  discussion below) which seem to be working well. She denies any exertional  chest pain, orthopnea, PND or leg swelling.  She is here today also to  followup her cholesterol management.   The record indicates the patient has an LDL target of less than 130, and she  is discouraged that she is not losing weight but at 5 foot 2 inch, her ideal  body weight is 142. She admits she has not been exercising, but denies any  exertional chest pain, orthopnea, PND, leg swelling or claudication  symptoms.   PHYSICAL EXAMINATION:  GENERAL: She is a pleasant, ambulatory white female  in no acute distress.  VITAL SIGNS: Weight 129 which is up 4 pounds from previous visit but still  well below the 142 target for ideal body weight.  HEENT: Unremarkable, pharynx clear.  LUNGS: Fields are perfectly clear bilaterally to auscultation and  percussion.  Regular rhythm without murmur, gallop or rub.  ABDOMEN:  Soft, benign.  EXTREMITIES:  Warm without calf tenderness, cyanosis, clubbing or edema.   Lipid profile was reviewed, and LDL is 112, with an HDL of 58 and normal  LFTs.   IMPRESSION:  1. Cough is well controlled on minimum doses of Flonase and omeprazole      which I have asked her to continue with the option of increasing the      dose if necessary of both Flonase and omeprazole  along with Tramadol to      eliminate cyclical coughing should the cough erupt again.  2. Hyperlipidemia.  LDL is at target (less than 130; the patient has a      positive family history and has been a smoker in the past).  3. Concerns about weight gain. She is actually still below her ideal body      weight, and I have told her that although she may want to lose a few      pounds for cosmetic reasons that statistically she falls into the      relatively low risk groups in terms of obesity. I did, however,      encourage regular aerobic exercise.   GENERAL HEALTH MAINTENANCE:  The patient was due for followup comprehensive  health care evaluation in February, is due for February 2008. We will see  her sooner if needed.            ______________________________  Charlaine Dalton [name], MD,  FCCP      MBW/MedQ  DD:  01/30/2006  DT:  02/01/2006  Job #:  [id number]

## 2010-09-20 NOTE — Assessment & Plan Note (Signed)
[facility]                             PULMONARY OFFICE NOTE   [name], [name]                       MRN:          [id number]  DATE:05/26/2006                            DOB:          Mar 31, 1956    HISTORY OF PRESENT ILLNESS:  The patient is a 55 year old white female  patient of Dr. [name] who has a known history of gastroesophageal reflux  and hyperlipidemia, presents for an acute office visit.  The patient  complains that she started having loose stools last evening  approximately at 10 p.m. and has  been having frequent loose stools  since then.  The patient complains of significant stomach cramps during  her episodes of diarrhea.  The patient took some Pepto Bismol early this  morning without much relief.  She complains that she gets very chilled  and sweaty during the episodes of diarrhea.  She denies any fever,  urinary symptoms, bloody stools, overt reflux symptoms, recent travel.  The patient had diarrhea approximately 2 months ago, was seen in the  office and was given Cipro for 5 days.  She reports her symptoms totally  resolved and she has had no recurrence.  Prior to last evening the  patient was doing well and in her usual state of good health.  The  patient has been tolerating fluids and without any nausea and vomiting,  however has not eaten any food today.   PAST MEDICAL HISTORY:  Hyperlipidemia, gastroesophageal reflux,  recurrent cough, rhinitis, colon polyps, and diverticulosis per  colonoscopy in March of 2005, back pain status post lumbar laminectomy.  Status post appendectomy.   Medications have been reviewed.   PHYSICAL EXAMINATION:  The patient is a pleasant female in no acute  distress.  She is afebrile with stable vital signs.  Oxygen saturation  is 100% on room air.  Blood pressure at recheck is 114/70 in the left  arm and 110/70 in the right arm with no change in pulse at 85.  HEENT:  Sclerae nonicteric.  Posterior  pharynx is clear.  Oral mucosa is  pink and moist.  NECK:  Supple, without cervical adenopathy.  No JVD.  LUNG:  Sounds are clear to auscultation bilaterally without any wheezes  or crackles.  CARDIAC:  S1, S2 without murmurs, rubs or gallops.  ABDOMEN:  Soft without any hepatosplenomegaly.  Bowel sounds are  positive throughout all 4 quadrants.  The patient has generalized  tenderness along the lower mid abdomen.  No guarding or rebound is  noted.  Negative CVA tenderness.  EXTREMITIES:  Warm, without any calf tenderness, cyanosis, clubbing or  edema  SKIN:  Warm, without rash.  NEURO:  Alert and oriented x3.   IMPRESSION AND PLAN:  Acute onset of diarrhea, I suspect of viral  nature.  The patient is to advance a clear liquid diet as tolerated.  Avoid lactose products for the next 5 days.  The patient may use over-  the-counter Gas-X as needed for gas, and may use Levsin 0.125 mg every 4  hours as needed for abdominal cramping.  The patient  is advised if  symptoms do not improve or worsen, she is to contact us immediately or  go to the closest emergency department.  The  patient does have followup with Dr. [name] for her routine office visit  and is to followup accordingly or sooner if needed.      Rubye Oaks, NP  Electronically Signed      Charlaine Dalton. [name], MD, [facility]  Electronically Signed   TP/MedQ  DD: 05/26/2006  DT: 05/26/2006  Job #: [id number]

## 2010-10-07 ENCOUNTER — Other Ambulatory Visit (INDEPENDENT_AMBULATORY_CARE_PROVIDER_SITE_OTHER): Payer: BC Managed Care – PPO

## 2010-10-07 ENCOUNTER — Encounter: Payer: Self-pay | Admitting: Internal Medicine

## 2010-10-07 ENCOUNTER — Ambulatory Visit (INDEPENDENT_AMBULATORY_CARE_PROVIDER_SITE_OTHER): Payer: BLUE CROSS/BLUE SHIELD | Admitting: Internal Medicine

## 2010-10-07 ENCOUNTER — Ambulatory Visit (INDEPENDENT_AMBULATORY_CARE_PROVIDER_SITE_OTHER)
Admission: RE | Admit: 2010-10-07 | Discharge: 2010-10-07 | Disposition: A | Discharge status: Home / Self Care | Payer: BC Managed Care – PPO | Source: Ambulatory Visit | Attending: Internal Medicine | Admitting: Internal Medicine

## 2010-10-07 DIAGNOSIS — R059 Cough, unspecified: Secondary | ICD-10-CM

## 2010-10-07 DIAGNOSIS — J4489 Other specified chronic obstructive pulmonary disease: Secondary | ICD-10-CM

## 2010-10-07 DIAGNOSIS — R05 Cough: Secondary | ICD-10-CM

## 2010-10-07 DIAGNOSIS — M899 Disorder of bone, unspecified: Secondary | ICD-10-CM

## 2010-10-07 DIAGNOSIS — E785 Hyperlipidemia, unspecified: Secondary | ICD-10-CM

## 2010-10-07 DIAGNOSIS — M8580 Other specified disorders of bone density and structure, unspecified site: Secondary | ICD-10-CM

## 2010-10-07 DIAGNOSIS — J449 Chronic obstructive pulmonary disease, unspecified: Secondary | ICD-10-CM

## 2010-10-07 DIAGNOSIS — Z0000 Encounter for general adult medical examination without abnormal findings: Secondary | ICD-10-CM

## 2010-10-07 LAB — CBC WITH DIFFERENTIAL/PLATELET
Basophils Absolute: 0 10*3/uL (ref 0.0–0.1)
Basophils Relative: 0.2 % (ref 0.0–3.0)
Eosinophils Absolute: 0.1 10*3/uL (ref 0.0–0.7)
Eosinophils Relative: 1 % (ref 0.0–5.0)
HCT: 39.5 % (ref 36.0–46.0)
Hemoglobin: 13.6 g/dL (ref 12.0–15.0)
Lymphocytes Relative: 23.5 % (ref 12.0–46.0)
Lymphs Abs: 1.3 10*3/uL (ref 0.7–4.0)
MCHC: 34.4 g/dL (ref 30.0–36.0)
MCV: 92.9 fl (ref 78.0–100.0)
Monocytes Absolute: 0.3 10*3/uL (ref 0.1–1.0)
Monocytes Relative: 5.3 % (ref 3.0–12.0)
Neutro Abs: 3.8 10*3/uL (ref 1.4–7.7)
Neutrophils Relative %: 70 % (ref 43.0–77.0)
Platelets: 205 10*3/uL (ref 150.0–400.0)
RBC: 4.25 Mil/uL (ref 3.87–5.11)
RDW: 13.3 % (ref 11.5–14.6)
WBC: 5.4 10*3/uL (ref 4.5–10.5)

## 2010-10-07 LAB — URINALYSIS
Bilirubin Urine: NEGATIVE
Hgb urine dipstick: NEGATIVE
Ketones, ur: NEGATIVE
Leukocytes, UA: NEGATIVE
Nitrite: NEGATIVE
Specific Gravity, Urine: <=1.005 (ref 1.000–1.030)
Total Protein, Urine: NEGATIVE
Urine Glucose: NEGATIVE
Urobilinogen, UA: 0.2 (ref 0.0–1.0)
pH: 5.5 (ref 5.0–8.0)

## 2010-10-07 LAB — HEPATIC FUNCTION PANEL
ALT: 27 U/L (ref 0–35)
AST: 27 U/L (ref 0–37)
Albumin: 4.2 g/dL (ref 3.5–5.2)
Alkaline Phosphatase: 55 U/L (ref 39–117)
Bilirubin, Direct: 0.1 mg/dL (ref 0.0–0.3)
Total Bilirubin: 0.8 mg/dL (ref 0.3–1.2)
Total Protein: 6.5 g/dL (ref 6.0–8.3)

## 2010-10-07 LAB — BASIC METABOLIC PANEL
BUN: 16 mg/dL (ref 6–23)
CO2: 28 mEq/L (ref 19–32)
Calcium: 8.9 mg/dL (ref 8.4–10.5)
Chloride: 103 mEq/L (ref 96–112)
Creatinine, Ser: 0.7 mg/dL (ref 0.4–1.2)
GFR: 96.3 mL/min (ref >60.00)
Glucose, Bld: 109 mg/dL — ABNORMAL HIGH (ref 70–99)
Potassium: 3.6 mEq/L (ref 3.5–5.1)
Sodium: 137 mEq/L (ref 135–145)

## 2010-10-07 LAB — TSH: TSH: 0.93 u[IU]/mL (ref 0.35–5.50)

## 2010-10-07 LAB — LIPID PANEL
Cholesterol: 188 mg/dL (ref 0–200)
HDL: 64.5 mg/dL (ref >39.00)
LDL Cholesterol: 95 mg/dL (ref 0–99)
Total CHOL/HDL Ratio: 3
Triglycerides: 142 mg/dL (ref 0.0–149.0)
VLDL: 28.4 mg/dL (ref 0.0–40.0)

## 2010-10-07 NOTE — Patient Instructions (Addendum)
See Tammy NP w/in  3 months with all your medications, even over the counter meds, separated in two separate bags, the ones you take no matter what vs the ones you stop once you feel better and take only as needed when you feel you need them.   Tammy  will generate for you a new user friendly medication calendar that will put [place] all on the same page re: your medication use.     Without this process, it simply isn't possible to assure that we are providing  your outpatient care  with  the attention to detail we feel you deserve.   If we cannot assure that you're getting that kind of care,  then we cannot manage your problem effectively from this clinic.  Once you have seen Tammy and we are sure that we're all on the same page with your medication use she will arrange follow up with me.   LATE ADD  Verify who when  where the DEXA's are being done next ov

## 2010-10-07 NOTE — Progress Notes (Signed)
Quick Note:  Spoke with pt and notified of results per Dr. [name]. Pt verbalized understanding and denied any questions.  ______ 

## 2010-10-07 NOTE — Progress Notes (Signed)
Subjective:     Patient ID: [name], female   DOB: [birth year], 55 y.o.   MRN: [id number]  HPI    62  yowf quit smoking 01/2010 with GOLD I  COPD at that point  And  h/o hyperlipidemia and chronic bronchitis complicated by severe hemoptysis but neg fob x 2 as follows:  August 31, 2008 new onset hempotysis assoc with epistaxis started without antecedent uri x 24 hours, abrupt onset, rx as aecopd .   Sep 02, 2008 to ED with recurrent hemoptysis. At least 2 cups of blood bright red. admit thru May 4 with FOB 5/3 clot over rul with neg cytology.   10/18/08 neg fob   January 31, 2010 NP  follow up and med review. Had PFT   showed FEV1 at 2.55L/m (112% of predicted). She is doing well with no smoking. Now working at [employer]. Exercising regularly, walking 2 miles a day. Back is feeling better but still sore esp in evening time. We discussed good posture and rest breaks with computer work.    10/07/2010 ov/Maysel Mccolm/ cpx able to exercise, feeling great with minimal nicotine craving. Minimal intermittent sense of congestion. Sleeping ok without nocturnal  or early am exac of resp c/o's  Pt denies any significant sore throat, dysphagia, itching, sneezing,  nasal congestion or excess/ purulent secretions,  fever, chills, sweats, unintended wt loss, pleuritic or exertional cp, hempoptysis, orthopnea pnd or leg swelling.    Also denies any obvious fluctuation of symptoms with weather or environmental changes or other aggravating or alleviating factors.    Past Medical History:  Hemoptysis/epistaxis August 31, 2008 ..............................................Marland KitchenWert  - FOB 09/04/2008 clot lodged over rul orifice, CT chest: no mass neg PE  - FOB 10/18/08 negative  GERD (ICD-530.81)  - Neg EGD 5/99  - Neg 24h ph study 9/99  POLYP, COLON (ICD-211.3)............................................................Marland KitchenMarina Goodell  - Colonoscopy 07/07/03  - Colonoscopy 10/31/08 : 1) Adenomatous Polyps, multiple (6) removed,  Severe diverticulosis  RHINITIS, CHRONIC (ICD-472.0)  HYPERLIPIDEMIA (ICD-272.4)  - Target LDL < 130 (pos fm hx, pos smoking hx)  COPD  GOLD I  - PFT's 01/31/10 FEV1  2.55 (112%) ratio 66, no change with B2  and DLC0 85% HEALTH MAINTENANCE..................................................................Marland KitchenWert  - dt 2/06  - pneumovax 2/07  - DEXA nl 08/18/06  - CPX Oct 02, 2009   Family History:  Coronary Heart Disease female < 39 tather  Osteoporsis and emphysema, mother  neg lung ca  melanoma in dad   Social History:  Started smoking again in March 2010- 3-4 cigs per day.  occ alcohol  married  2 children  Works for [employer]     Review of Systems  Constitutional: Negative for fever, chills and unexpected weight change.  HENT: Positive for congestion. Negative for ear pain, nosebleeds, sore throat, rhinorrhea, sneezing, trouble swallowing, dental problem, voice change, postnasal drip and sinus pressure.   Eyes: Negative for visual disturbance.  Respiratory: Positive for cough. Negative for choking and shortness of breath.   Cardiovascular: Negative for chest pain and leg swelling.  Gastrointestinal: Negative for vomiting, abdominal pain and diarrhea.  Genitourinary: Negative for difficulty urinating.  Musculoskeletal: Negative for arthralgias.  Skin: Negative for rash.  Neurological: Negative for tremors, syncope and headaches.  Hematological: Does not bruise/bleed easily.       Objective:   Physical Exam     Amb wf NAD  wt 129 Sep 22, 2008 > 132 November 15, 2008 > 136 January 16, 2009 > 136 May 08, 2009>>134 10/02/09 >  10/07/2010  133  HEENT: nl dentition, turbinates, external ear canals without cough reflex, NM pale w/ clear d/ch,  Neck without JVD/Nodes/TM  Lungs CTA no wheezing or crackles.  RRR no s3 or murmur or increase in P2. No edema  Abd soft and benign with nl excursion in the supine position. No bruits or organomegaly  Ext without clubbing, cyanosis, calf  tenderness  Skin: new tattoo on back  Pulses sym both feet but reduced MS exam nl gait and station  Assessment:          Plan:

## 2010-10-08 NOTE — Assessment & Plan Note (Signed)
Check Vit D Will need to verify whether future bone densities are done her or per GYN

## 2010-10-08 NOTE — Assessment & Plan Note (Signed)
Minimal residual intermittent congestion sensation s ex intol or sleep disruption so no change rx needed - key here is to maintain off cigs

## 2010-10-08 NOTE — Assessment & Plan Note (Addendum)
At goal on present rx, no evidence of adverse effects, continue as is    Each maintenance medication was reviewed in detail including most importantly the difference between maintenance and as needed and under what circumstances the prns are to be used.  Please see instructions for details which were reviewed in writing and the patient given a copy.  This was done in the context of a medication calendar review which provided the patient with a user-friendly unambiguous mechanism for medication administration and reconciliation and provides an action plan for all active problems. It is critical that this be shown to every doctor  for modification during the office visit if necessary so the patient can use it as a working document.

## 2010-10-10 LAB — VITAMIN D 1,25 DIHYDROXY
Vitamin D 1, 25 (OH)2 Total: 60 pg/mL (ref 18–72)
Vitamin D2 1, 25 (OH)2: <8 pg/mL
Vitamin D3 1, 25 (OH)2: 60 pg/mL

## 2010-10-11 ENCOUNTER — Telehealth: Payer: Self-pay | Admitting: Internal Medicine

## 2010-10-11 NOTE — Progress Notes (Signed)
Quick Note:  Spoke with pt and notified of results per Dr. [name]. Pt verbalized understanding and denied any questions.  ______ 

## 2010-10-11 NOTE — Telephone Encounter (Signed)
Spoke with pt and notified of results per Dr. [name]. Pt verbalized understanding and denied any questions. 

## 2010-11-10 ENCOUNTER — Other Ambulatory Visit: Payer: Self-pay | Admitting: Internal Medicine

## 2011-01-13 ENCOUNTER — Encounter: Payer: BC Managed Care – PPO | Admitting: Adult Health

## 2011-01-23 ENCOUNTER — Encounter: Payer: BC Managed Care – PPO | Admitting: Adult Health

## 2011-01-30 ENCOUNTER — Encounter: Payer: Self-pay | Admitting: Adult Health

## 2011-01-30 ENCOUNTER — Ambulatory Visit (INDEPENDENT_AMBULATORY_CARE_PROVIDER_SITE_OTHER): Payer: Commercial Managed Care - PPO | Admitting: Adult Health

## 2011-01-30 DIAGNOSIS — J449 Chronic obstructive pulmonary disease, unspecified: Secondary | ICD-10-CM

## 2011-01-30 DIAGNOSIS — J310 Chronic rhinitis: Secondary | ICD-10-CM

## 2011-01-30 NOTE — Patient Instructions (Signed)
Continue on current meds.  Follow med calendar closely and bring to each visit.  follow up Dr. [name]  4 months and As needed

## 2011-01-30 NOTE — Progress Notes (Signed)
Subjective:     Patient ID: [name], female   DOB: 08-11-1955, 55 y.o.   MRN: [id number]  HPI  27  yowf quit smoking 01/2010 with GOLD I  COPD at that point  And  h/o hyperlipidemia and chronic bronchitis complicated by severe hemoptysis but neg fob x 2 as follows:  August 31, 2008 new onset hempotysis assoc with epistaxis started without antecedent uri x 24 hours, abrupt onset, rx as aecopd .   Sep 02, 2008 to ED with recurrent hemoptysis. At least 2 cups of blood bright red. admit thru May 4 with FOB 5/3 clot over rul with neg cytology.   10/18/08 neg fob   January 31, 2010 NP  follow up and med review. Had PFT   showed FEV1 at 2.55L/m (112% of predicted). She is doing well with no smoking. Now working at [employer]. Exercising regularly, walking 2 miles a day. Back is feeling better but still sore esp in evening time. We discussed good posture and rest breaks with computer work.    10/07/2010 ov/[name]/ cpx able to exercise, feeling great with minimal nicotine craving. Minimal intermittent sense of congestion. Sleeping ok without nocturnal  or early am exac of resp c/o's >>  01/30/2011 Follow up and med review Pt returns for follow up . Doing well since last visit. We reviewed her med calendar today and updated her med calendar . She did not bring her meds in with her today. She had her previous copy of med calendar. No increased cough or dyspnea. Exercising without difficulty. Walked 3 miles today at lunch.  No hemopytsis or edema.   Past Medical History:  Hemoptysis/epistaxis August 31, 2008 ..............................................Marland KitchenWert  - FOB 09/04/2008 clot lodged over rul orifice, CT chest: no mass neg PE  - FOB 10/18/08 negative  GERD (ICD-530.81)  - Neg EGD 5/99  - Neg 24h ph study 9/99  POLYP, COLON (ICD-211.3)............................................................Marland KitchenMarina Goodell  - Colonoscopy 07/07/03  - Colonoscopy 10/31/08 : 1) Adenomatous Polyps, multiple (6) removed, Severe  diverticulosis  RHINITIS, CHRONIC (ICD-472.0)  HYPERLIPIDEMIA (ICD-272.4)  - Target LDL < 130 (pos fm hx, pos smoking hx)  COPD  GOLD I  - PFT's 01/31/10 FEV1  2.55 (112%) ratio 66, no change with B2  and DLC0 85% HEALTH MAINTENANCE..................................................................Marland KitchenWert  - dt 2/06  - pneumovax 2/07  - DEXA nl 08/18/06  - CPX Oct 02, 2009  - complex med regimen , med calendar  01/30/2011   Family History:  Coronary Heart Disease female < 38 tather  Osteoporsis and emphysema, mother  neg lung ca  melanoma in dad   Social History:  Started smoking again in March 2010- 3-4 cigs per day.  Quit 2011 occ alcohol  married  2 children  Works for [employer]     Review of Systems  Constitutional: Negative for fever, chills and unexpected weight change.  HENT:  Negative for ear pain, nosebleeds, sore throat, rhinorrhea, sneezing, trouble swallowing, dental problem, voice change, postnasal drip and sinus pressure.   Eyes: Negative for visual disturbance.  Respiratory:  Negative for choking and shortness of breath.   Cardiovascular: Negative for chest pain and leg swelling.  Gastrointestinal: Negative for vomiting, abdominal pain and diarrhea.  Genitourinary: Negative for difficulty urinating.  Musculoskeletal: Negative for arthralgias.  Skin: Negative for rash.  Neurological: Negative for tremors, syncope and headaches.  Hematological: Does not bruise/bleed easily.       Objective:   Physical Exam     Amb wf NAD  wt 129 Sep 22, 2008 > 132 November 15, 2008 > 136 January 16, 2009 > 136 May 08, 2009>>134 10/02/09 >  10/07/2010  133   >>135 01/30/2011  HEENT: nl dentition, turbinates, external ear canals without cough reflex, NM pale  Neck without JVD/Nodes/TM  Lungs CTA no wheezing or crackles.  RRR no s3 or murmur or increase in P2. No edema  Abd soft and benign with nl excursion in the supine position. No bruits or organomegaly  Ext without clubbing,  cyanosis, calf tenderness  Pulses sym both feet but reduced MS exam nl gait and station  Assessment:          Plan:

## 2011-01-30 NOTE — Assessment & Plan Note (Signed)
Compensated on present regimen  Patient's medications were reviewed today and patient education was given. Computerized medication calendar was adjusted/completed    

## 2011-02-13 LAB — CBC
HCT: 33.5 — ABNORMAL LOW
HCT: 42.2
Hemoglobin: 11.8 — ABNORMAL LOW
Hemoglobin: 14.5
MCHC: 34.4
MCHC: 35.1
MCV: 94.2
MCV: 94.7
Platelets: 193
Platelets: 244
RBC: 3.56 — ABNORMAL LOW
RBC: 4.46
RDW: 13.1
RDW: 13.4
WBC: 5.8
WBC: 6.7

## 2011-02-23 IMAGING — CR DG CHEST 2V
2 series · 2 of 2 positions shown · non-contrast
Comparison: Chest x-ray 09/07/2008 and chest CT 09/02/2008

CLINICAL DATA: Hemoptysis

CHEST - 2 VIEW

[view not recorded (1 of 2)]
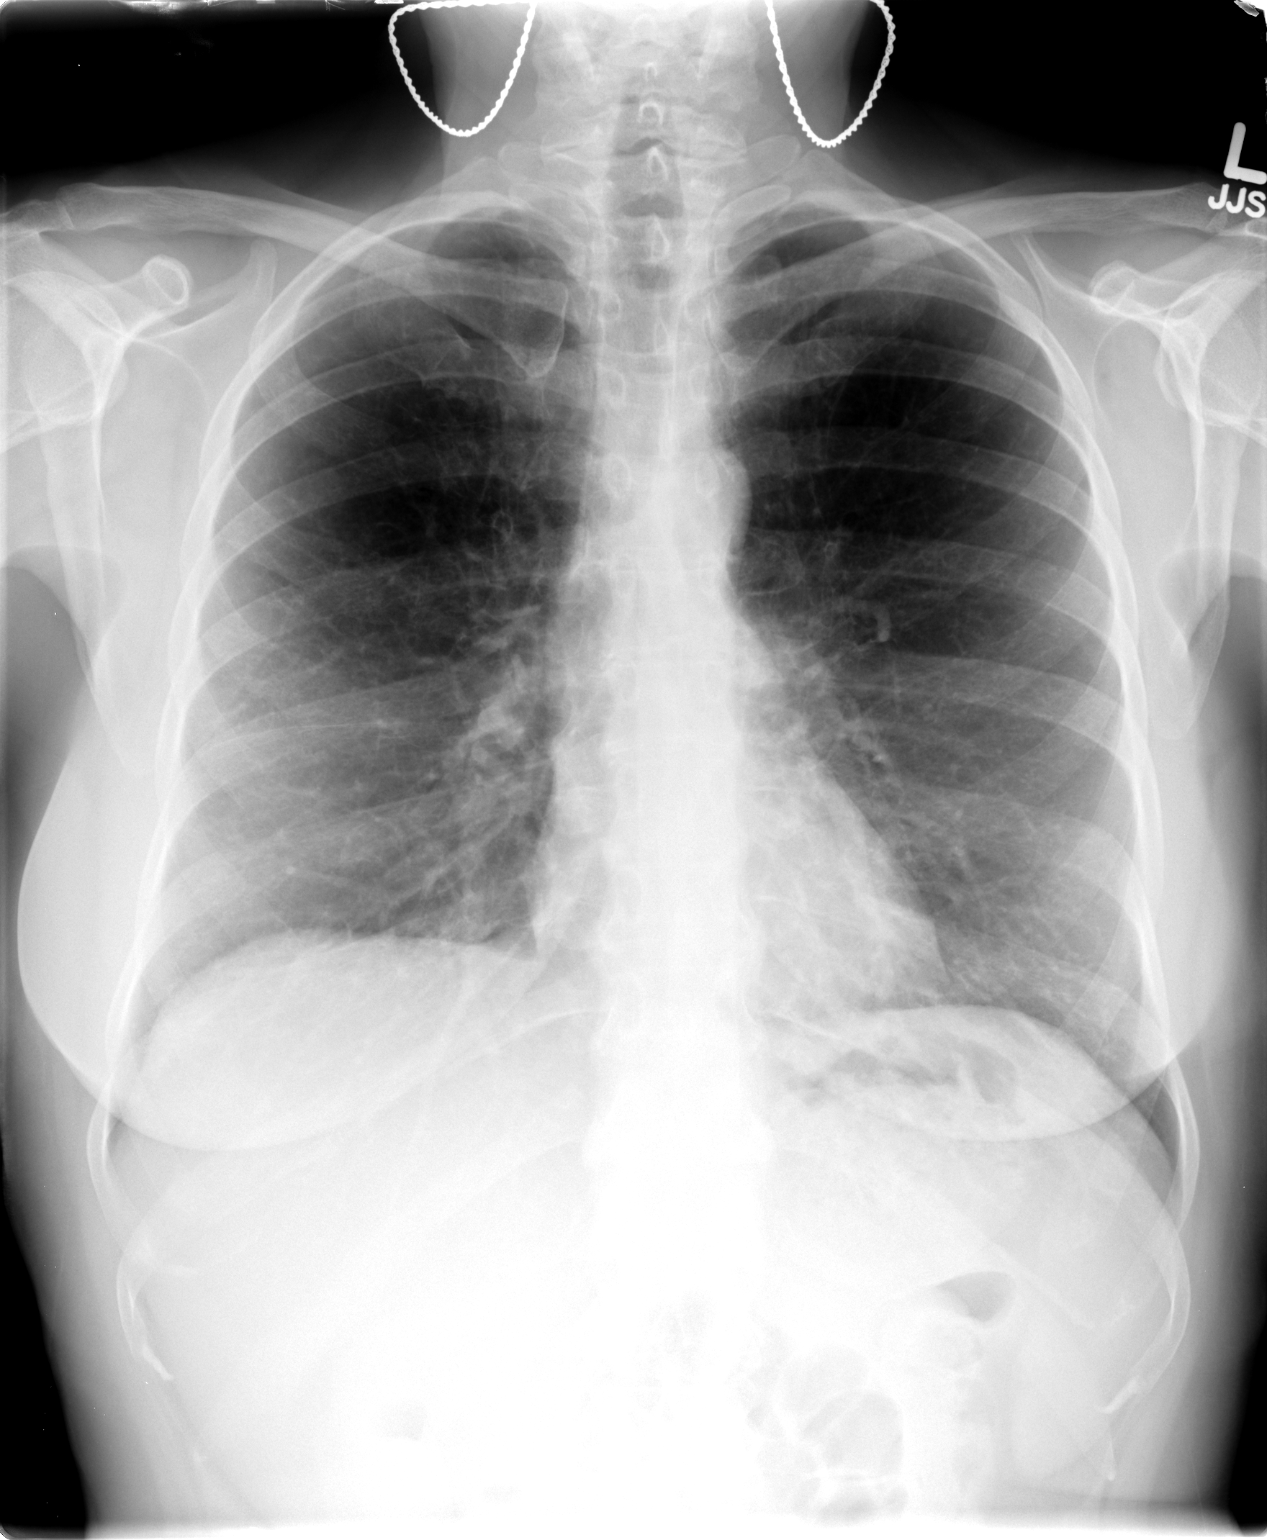

[view not recorded (2 of 2)]
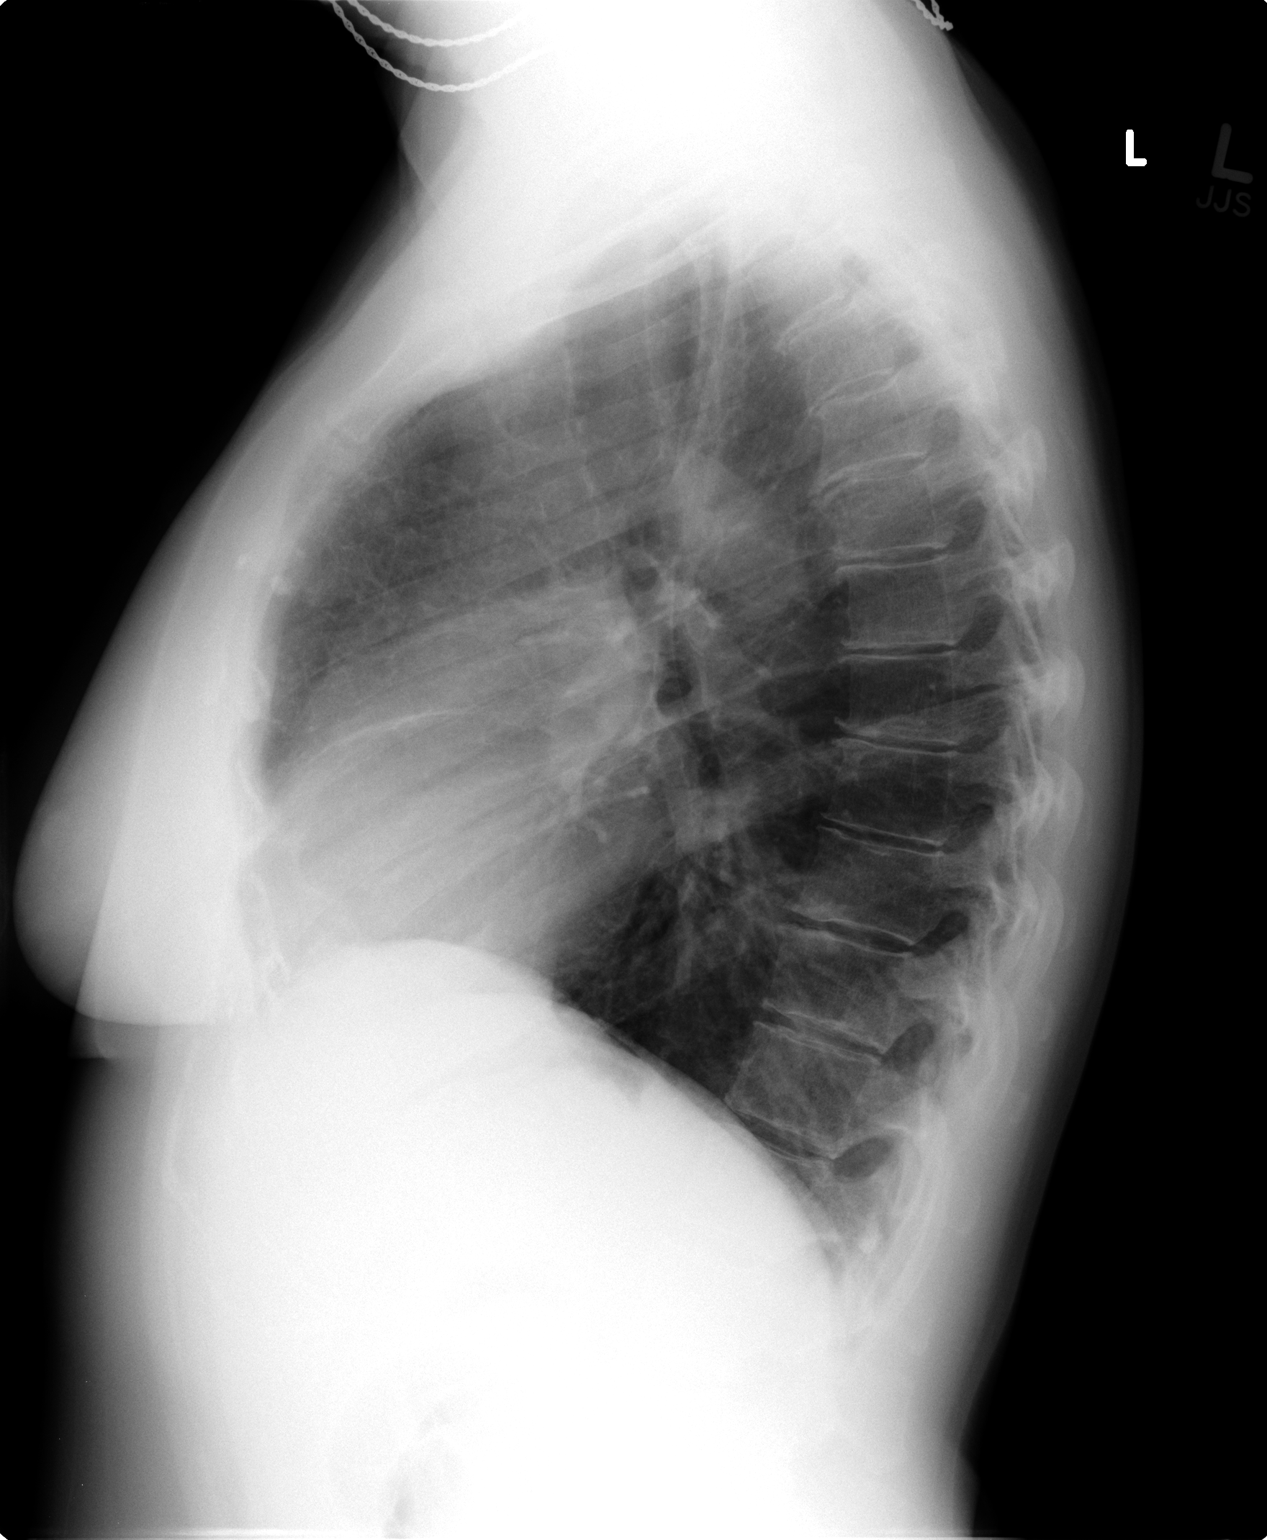

[2 of 2 positions shown; findings below may reference images not displayed]

FINDINGS: There has been significant improvement in aeration of the
right upper lobe compared to the chest radiograph of 09/07/2008,
consistent with  resolving pneumonia.  Mild residual reticular
opacities are seen in the right upper lobe, to the best advantage
on the lateral view.  No atelectasis is identified.  The left lung
remains clear.  There is no pleural effusion, pneumothorax, or
evidence of lymphadenopathy.  The trachea is midline. No acute
osseous abnormality.
IMPRESSION: 1.  Resolving right upper lobe pneumonia.
2.  No acute findings compared to 09/07/2008.

## 2011-02-25 ENCOUNTER — Other Ambulatory Visit: Payer: Self-pay | Admitting: Internal Medicine

## 2011-03-05 ENCOUNTER — Encounter: Payer: Self-pay | Admitting: Internal Medicine

## 2011-03-26 ENCOUNTER — Other Ambulatory Visit: Payer: Self-pay | Admitting: Internal Medicine

## 2011-03-26 MED ORDER — ALPRAZOLAM 0.5 MG PO TABS: ORAL_TABLET | ORAL | Status: DC

## 2011-05-14 ENCOUNTER — Other Ambulatory Visit: Payer: Self-pay | Admitting: Internal Medicine

## 2011-05-19 ENCOUNTER — Telehealth: Payer: Self-pay | Admitting: Internal Medicine

## 2011-05-19 NOTE — Telephone Encounter (Signed)
Pt returned call. I advised her that rx is at pharmacy. Nothing further needed per pt. [name]

## 2011-05-19 NOTE — Telephone Encounter (Signed)
lmomtcb x1--I spoke with [facility] and they do have pt rx and is filling it for her today.

## 2011-07-11 ENCOUNTER — Other Ambulatory Visit (INDEPENDENT_AMBULATORY_CARE_PROVIDER_SITE_OTHER): Payer: Commercial Managed Care - PPO

## 2011-07-11 ENCOUNTER — Ambulatory Visit (INDEPENDENT_AMBULATORY_CARE_PROVIDER_SITE_OTHER)
Admission: RE | Admit: 2011-07-11 | Discharge: 2011-07-11 | Disposition: A | Discharge status: Home / Self Care | Payer: Commercial Managed Care - PPO | Source: Ambulatory Visit | Attending: Internal Medicine | Admitting: Internal Medicine

## 2011-07-11 ENCOUNTER — Encounter: Payer: Self-pay | Admitting: Internal Medicine

## 2011-07-11 ENCOUNTER — Ambulatory Visit (INDEPENDENT_AMBULATORY_CARE_PROVIDER_SITE_OTHER): Payer: Commercial Managed Care - PPO | Admitting: Internal Medicine

## 2011-07-11 VITALS — BP 110/80 | HR 85 | Temp 97.8°F | Ht 62.0 in | Wt 136.6 lb

## 2011-07-11 DIAGNOSIS — Z0000 Encounter for general adult medical examination without abnormal findings: Secondary | ICD-10-CM

## 2011-07-11 DIAGNOSIS — M899 Disorder of bone, unspecified: Secondary | ICD-10-CM

## 2011-07-11 DIAGNOSIS — J310 Chronic rhinitis: Secondary | ICD-10-CM

## 2011-07-11 DIAGNOSIS — K219 Gastro-esophageal reflux disease without esophagitis: Secondary | ICD-10-CM

## 2011-07-11 DIAGNOSIS — M8580 Other specified disorders of bone density and structure, unspecified site: Secondary | ICD-10-CM

## 2011-07-11 DIAGNOSIS — E785 Hyperlipidemia, unspecified: Secondary | ICD-10-CM

## 2011-07-11 DIAGNOSIS — J449 Chronic obstructive pulmonary disease, unspecified: Secondary | ICD-10-CM

## 2011-07-11 LAB — CBC WITH DIFFERENTIAL/PLATELET
Basophils Absolute: 0.1 10*3/uL (ref 0.0–0.1)
Basophils Relative: 1.2 % (ref 0.0–3.0)
Eosinophils Absolute: 0.1 10*3/uL (ref 0.0–0.7)
Eosinophils Relative: 2.1 % (ref 0.0–5.0)
HCT: 41.7 % (ref 36.0–46.0)
Hemoglobin: 14.1 g/dL (ref 12.0–15.0)
Lymphocytes Relative: 19.7 % (ref 12.0–46.0)
Lymphs Abs: 1 10*3/uL (ref 0.7–4.0)
MCHC: 33.9 g/dL (ref 30.0–36.0)
MCV: 92 fl (ref 78.0–100.0)
Monocytes Absolute: 0.4 10*3/uL (ref 0.1–1.0)
Monocytes Relative: 7.6 % (ref 3.0–12.0)
Neutro Abs: 3.5 10*3/uL (ref 1.4–7.7)
Neutrophils Relative %: 69.4 % (ref 43.0–77.0)
Platelets: 206 10*3/uL (ref 150.0–400.0)
RBC: 4.53 Mil/uL (ref 3.87–5.11)
RDW: 14 % (ref 11.5–14.6)
WBC: 5.1 10*3/uL (ref 4.5–10.5)

## 2011-07-11 LAB — HEPATIC FUNCTION PANEL
ALT: 28 U/L (ref 0–35)
AST: 26 U/L (ref 0–37)
Albumin: 4.5 g/dL (ref 3.5–5.2)
Alkaline Phosphatase: 53 U/L (ref 39–117)
Bilirubin, Direct: 0.1 mg/dL (ref 0.0–0.3)
Total Bilirubin: 0.6 mg/dL (ref 0.3–1.2)
Total Protein: 6.9 g/dL (ref 6.0–8.3)

## 2011-07-11 LAB — LIPID PANEL
Cholesterol: 156 mg/dL (ref 0–200)
HDL: 58.3 mg/dL (ref >39.00)
LDL Cholesterol: 81 mg/dL (ref 0–99)
Total CHOL/HDL Ratio: 3
Triglycerides: 82 mg/dL (ref 0.0–149.0)
VLDL: 16.4 mg/dL (ref 0.0–40.0)

## 2011-07-11 LAB — BASIC METABOLIC PANEL
BUN: 18 mg/dL (ref 6–23)
CO2: 29 mEq/L (ref 19–32)
Calcium: 9.2 mg/dL (ref 8.4–10.5)
Chloride: 101 mEq/L (ref 96–112)
Creatinine, Ser: 0.7 mg/dL (ref 0.4–1.2)
GFR: 89.83 mL/min (ref >60.00)
Glucose, Bld: 90 mg/dL (ref 70–99)
Potassium: 3.7 mEq/L (ref 3.5–5.1)
Sodium: 137 mEq/L (ref 135–145)

## 2011-07-11 LAB — URINALYSIS, ROUTINE W REFLEX MICROSCOPIC
Bilirubin Urine: NEGATIVE
Ketones, ur: NEGATIVE
Leukocytes, UA: NEGATIVE
Nitrite: NEGATIVE
Specific Gravity, Urine: 1.02 (ref 1.000–1.030)
Urine Glucose: NEGATIVE
Urobilinogen, UA: 0.2 (ref 0.0–1.0)
pH: 6 (ref 5.0–8.0)

## 2011-07-11 LAB — TSH: TSH: 1.25 u[IU]/mL (ref 0.35–5.50)

## 2011-07-11 MED ORDER — EZETIMIBE-SIMVASTATIN 10-80 MG PO TABS: 1.0000 | ORAL_TABLET | Freq: Every day | ORAL | Status: DC

## 2011-07-11 MED ORDER — OMEPRAZOLE 20 MG PO CPDR: DELAYED_RELEASE_CAPSULE | ORAL | Status: DC

## 2011-07-11 MED ORDER — ALPRAZOLAM 0.5 MG PO TABS: ORAL_TABLET | ORAL | Status: DC

## 2011-07-11 NOTE — Patient Instructions (Signed)
Increase prilosec to 40 mg Take 30-60 min before first meal of the day   Please remember to go to the lab and x-ray department downstairs for your tests - we will call you with the results when they are available.     See calendar for specific medication instructions and bring it back for each and every office visit for every healthcare provider you see.  Without it,  you may not receive the best quality medical care that we feel you deserve.  You will note that the calendar groups together  your maintenance  medications that are timed at particular times of the day.  Think of this as your checklist for what your doctor has instructed you to do until your next evaluation to see what benefit  there is  to staying on a consistent group of medications intended to keep you well.  The other group at the bottom is entirely up to you to use as you see fit  for specific symptoms that may arise between visits that require you to treat them on an as needed basis.  Think of this as your action plan or "what if" list.   Separating the top medications from the bottom group is fundamental to providing you adequate care going forward.    Please schedule a follow up visit in 3 months but call sooner if needed

## 2011-07-11 NOTE — Progress Notes (Signed)
HPI  Subjective:   Patient ID: MIEKE BRINLEY, female DOB: Mar 29, 1956  MRN: [id number]  HPI  51 yowf quit smoking 01/2010 with GOLD I COPD at that point And h/o hyperlipidemia and chronic bronchitis complicated by severe hemoptysis but neg fob x 2 last one 10/18/08   Sep 02, 2008 to ED with recurrent hemoptysis. At least 2 cups of blood bright red. admit thru May 4 with FOB 5/3 clot over rul with neg cytology.  10/18/08 neg fob    January 31, 2010 NP follow up and med review. Had PFT showed FEV1 at 2.55L/m (112% of predicted). She is doing well with no smoking. Now working at [employer]. Exercising regularly, walking 2 miles a day. Back is feeling better but still sore esp in evening time. We discussed good posture and rest breaks with computer work.   07/11/2011 f/u ov/Alesia Oshields cc cpx one episode of cp of after v8 resolved p rx of reflux and diet restrictions. No sob or cough,  Minimal pnds at hs no purulent sputum or sob or even sign cough  Sleeping ok without nocturnal  or early am exacerbation  of respiratory  c/o's or need for noct saba. Also denies any obvious fluctuation of symptoms with weather or environmental changes or other aggravating or alleviating factors except as outlined above     Past Medical History:  Hemoptysis/epistaxis August 31, 2008 ..............................................Marland KitchenWert  - FOB 09/04/2008 clot lodged over rul orifice, CT chest: no mass neg PE  - FOB 10/18/08 negative  GERD (ICD-530.81)  - Neg EGD 5/99  - Neg 24h ph study 9/99  POLYP, COLON (ICD-211.3)............................................................Marland KitchenMarina Goodell  - Colonoscopy 07/07/03  - Colonoscopy 10/31/08 : 1) Adenomatous Polyps, multiple (6) removed, Severe diverticulosis  RHINITIS, CHRONIC (ICD-472.0)  HYPERLIPIDEMIA (ICD-272.4)  - Target LDL < 130 (pos fm hx, pos smoking hx)  COPD GOLD I  - PFT's 01/31/10 FEV1 2.55 (112%) ratio 66, no change with B2 and DLC0 85%  HEALTH  MAINTENANCE..................................................................Marland KitchenWert  - dt 06/2004 - pneumovax 06/2005  - DEXA nl 08/18/06  - CPX 07/11/2011   Family History:  Coronary Heart Disease female < 55 ftather  Osteoporosis and emphysema, mother  neg lung ca  melanoma in dad   Social History:  Quit smoking Oct 2012 occ alcohol  married  2 children  Works for [employer]         Review of Systems  Constitutional: Negative for fever, chills, diaphoresis, activity change, appetite change, fatigue and unexpected weight change.  HENT: Positive for postnasal drip. Negative for hearing loss, ear pain, nosebleeds, congestion, sore throat, facial swelling, rhinorrhea, sneezing, mouth sores, trouble swallowing, neck pain, neck stiffness, dental problem, voice change, sinus pressure, tinnitus and ear discharge.   Eyes: Negative for photophobia, discharge, itching and visual disturbance.  Respiratory: Negative for apnea, cough, choking, chest tightness, shortness of breath, wheezing and stridor.   Cardiovascular: Positive for chest pain. Negative for palpitations and leg swelling.  Gastrointestinal: Negative for nausea, vomiting, abdominal pain, constipation, blood in stool and abdominal distention.  Genitourinary: Negative for dysuria, urgency, frequency, hematuria, flank pain, decreased urine volume and difficulty urinating.  Musculoskeletal: Negative for myalgias, back pain, joint swelling, arthralgias and gait problem.  Skin: Negative for color change, pallor and rash.  Neurological: Negative for dizziness, tremors, seizures, syncope, speech difficulty, weakness, light-headedness, numbness and headaches.  Hematological: Negative for adenopathy. Does not bruise/bleed easily.  Psychiatric/Behavioral: Positive for sleep disturbance. Negative for confusion and agitation. The patient is nervous/anxious.  Objective:   Physical Exam     Amb wf NAD  wt 129 Sep 22, 2008  > 136 May 08, 2009> > 10/07/2010 133 > 07/11/2011 136  HEENT: nl dentition, turbinates, external ear canals without cough reflex, NM pale w/ clear d/ch,  Neck without JVD/Nodes/TM  Lungs CTA no wheezing or crackles.  RRR no s3 or murmur or increase in P2. No edema  Abd soft and benign with nl excursion in the supine position. No bruits or organomegaly  Ext without clubbing, cyanosis, calf tenderness  Skin: new tattoo on back  Pulses sym both feet but reduced  MS exam nl gait and station no deformities or restrictions Neuro nl sensorium, no motor or cerebellar deficits    CXR  07/11/2011 :  COPD. No acute cardiopulmonary disease      Assessment & Plan:

## 2011-07-12 LAB — VITAMIN D 25 HYDROXY (VIT D DEFICIENCY, FRACTURES): Vit D, 25-Hydroxy: 55 ng/mL (ref 30–89)

## 2011-07-12 NOTE — Assessment & Plan Note (Signed)
GOLD I by pft's 01/2010 when quit smoking - PFT's 01/31/10  FEV1  2.55 (112%) ratio 66 no change p B2  and  DLCO  85%  Adequate control on present rx. I reviewed the Flethcher curve with patient that basically indicates  if you quit smoking when your best day FEV1 is still well preserved(which hers clearly is) it is highly unlikely you will progress to severe disease and informed the patient there was no medication on the market that has proven to change the curve or the likelihood of progression.  Therefore stopping smoking and maintaining abstinence is the most important aspect of care, not choice of inhalers or for that matter, doctors.

## 2011-07-12 NOTE — Assessment & Plan Note (Addendum)
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   These are the same pts (now being labeled as having "irritable larynx syndrome" by some cough centers) who not infrequently have a history of having failed to tolerate ace inhibitors,  dry powder inhalers or biphosphonates or report having atypical reflux symptoms that don't respond to standard doses of PPI , and are easily confused as having aecopd or asthma flares by even experienced allergists/ pulmonologists.   For now increase ppi to 40 mg Take 30-60 min before first meal of the day and next add pepcid 20 at hs if not resolved

## 2011-07-12 NOTE — Assessment & Plan Note (Signed)
   Each maintenance medication was reviewed in detail including most importantly the difference between maintenance and as needed and under what circumstances the prns are to be used. This was done in the context of a medication calendar review which provided the patient with a user-friendly unambiguous mechanism for medication administration and reconciliation and provides an action plan for all active problems. It is critical that this be shown to every doctor  for modification during the office visit if necessary so the patient can use it as a working document.     

## 2011-07-12 NOTE — Assessment & Plan Note (Signed)
-   Goal LDL < 130 as former smoker pos fm hx  Adequate control on present rx, reviewed

## 2011-07-12 NOTE — Assessment & Plan Note (Signed)
Vit d levels adequate, no change rx - key is to maintain off cigs

## 2011-07-14 ENCOUNTER — Telehealth: Payer: Self-pay | Admitting: Internal Medicine

## 2011-07-14 NOTE — Telephone Encounter (Signed)
Result Note     Call patient : Study is unremarkable, no change in recs   Notes Recorded by Nyoka Cowden, MD on 07/11/2011 at 4:39 PM Call pt: Reviewed cxr and no acute change so no change in recommendations made at ov      Pt is aware of results.

## 2011-07-14 NOTE — Progress Notes (Signed)
Quick Note:  Pt aware of results and no questions or concerns at this time. ______ 

## 2011-07-14 NOTE — Telephone Encounter (Signed)
Pt called back again. Says leslie called her last fri re: results of cxr- pt has "stressed all weekend and wants a callback asap this am "please". Work # [phone]. [name]

## 2011-07-17 ENCOUNTER — Other Ambulatory Visit: Payer: Self-pay | Admitting: Obstetrics and Gynecology

## 2011-07-24 ENCOUNTER — Telehealth: Payer: Self-pay | Admitting: Internal Medicine

## 2011-07-24 NOTE — Telephone Encounter (Signed)
Pt is requesting a copy of her labs and CXR from 07/11/11 be mailed to her home address, which was verified with the pt. She also wants to know if Dr. [name] or his nurse finished the form for [facility]. She says this was given to the nurse at her OV. I will forward to [facility] to see if this was completed.

## 2011-07-24 NOTE — Telephone Encounter (Signed)
Yes- the form was completed and faxed back. I sent it down in his scan folder already. Spoke with the pt and notified that this was done and she verbalized understanding and states nothing further needed.

## 2011-11-10 ENCOUNTER — Encounter: Payer: Self-pay | Admitting: Internal Medicine

## 2011-11-12 ENCOUNTER — Encounter: Payer: Self-pay | Admitting: Internal Medicine

## 2011-11-12 ENCOUNTER — Ambulatory Visit (INDEPENDENT_AMBULATORY_CARE_PROVIDER_SITE_OTHER): Payer: Commercial Managed Care - PPO | Admitting: Internal Medicine

## 2011-11-12 VITALS — BP 114/80 | HR 76 | Temp 97.6°F | Ht 62.0 in | Wt 136.8 lb

## 2011-11-12 DIAGNOSIS — K219 Gastro-esophageal reflux disease without esophagitis: Secondary | ICD-10-CM

## 2011-11-12 DIAGNOSIS — E785 Hyperlipidemia, unspecified: Secondary | ICD-10-CM

## 2011-11-12 MED ORDER — MOMETASONE FUROATE 50 MCG/ACT NA SUSP: 1.0000 | NASAL | Status: DC

## 2011-11-12 NOTE — Progress Notes (Signed)
Subjective:   Patient ID: [name], female DOB: 1956-01-23  MRN: [id number]  HPI  45 yowf quit smoking 01/2010 with GOLD I COPD at that point And h/o hyperlipidemia and chronic bronchitis complicated by severe hemoptysis but neg fob x 2 last one 10/18/08   Sep 02, 2008 to ED with recurrent hemoptysis. At least 2 cups of blood bright red. admit thru May 4 with FOB 5/3 clot over rul with neg cytology.  10/18/08 neg fob    January 31, 2010 NP follow up and med review. Had PFT showed FEV1 at 2.55L/m (112% of predicted). She is doing well with no smoking. Now working at [employer]. Exercising regularly, walking 2 miles a day. Back is feeling better but still sore esp in evening time. We discussed good posture and rest breaks with computer work.   07/11/2011 f/u ov/[name] cc cpx one episode of cp of after v8 resolved p rx of reflux and diet restrictions. No sob or cough,  Minimal pnds at hs no purulent sputum or sob or even sign cough rec Increase prilosec to 40 mg Take 30-60 min before first meal of the day  See calendar for specific medication instructions   11/12/2011 f/u ov/[name] here for f/u lipids/ chronic rhinitis/ gerdcc Patient states reflux better since last visit. States no episodes recently. Denies cough, wheezing, chest pain,and chest tightness - only using the omeprazole 20mg  daily, not 40 mg as per recs and calendar, because rx wasn't changed on bottle.   No cp tia or claudication.   Sleeping ok without nocturnal  or early am exacerbation  of respiratory  c/o's or need for noct saba. Also denies any obvious fluctuation of symptoms with weather or environmental changes or other aggravating or alleviating factors except as outlined above     Past Medical History:  Hemoptysis/epistaxis August 31, 2008 ..............................................Marland KitchenWert  - FOB 09/04/2008 clot lodged over rul orifice, CT chest: no mass neg PE  - FOB 10/18/08 negative  GERD (ICD-530.81)  - Neg EGD 5/99    - Neg 24h ph study 9/99  POLYP, COLON (ICD-211.3)............................................................Marland KitchenMarina Goodell  - Colonoscopy 07/07/03  - Colonoscopy 10/31/08 : 1) Adenomatous Polyps, multiple (6) removed, Severe diverticulosis  RHINITIS, CHRONIC (ICD-472.0)  HYPERLIPIDEMIA (ICD-272.4)  - Target LDL < 130 (pos fm hx, pos smoking hx)  COPD GOLD I  - PFT's 01/31/10 FEV1 2.55 (112%) ratio 66, no change with B2 and DLC0 85%  HEALTH MAINTENANCE..................................................................Marland KitchenWert  - dt 06/2004 - pneumovax 06/2005  - DEXA nl 08/18/06  - CPX 07/11/2011      Family History:  Coronary Heart Disease female < 55 ftather  Osteoporosis and emphysema, mother  neg lung ca  melanoma in dad   Social History:  Quit smoking Oct 2012 occ alcohol  married  2 children  Works for [employer]               Objective:   Physical Exam     Amb wf NAD  wt 129 Sep 22, 2008  > 136 May 08, 2009> > 10/07/2010 133 > 07/11/2011 136 > 11/12/2011  136 HEENT: nl dentition, turbinates, external ear canals without cough reflex, NM pale w/ clear d/ch,  Neck without JVD/Nodes/TM  Lungs CTA no wheezing or crackles.  RRR no s3 or murmur or increase in P2. No edema  Abd soft and benign with nl excursion in the supine position. No bruits or organomegaly  Ext without clubbing, cyanosis, calf tenderness  Skin: new tattoo  on back  Pulses sym both feet but reduced  MS exam nl gait and station no deformities or restrictions    CXR  07/11/2011 :  COPD. No acute cardiopulmonary disease      Assessment & Plan:

## 2011-11-12 NOTE — Patient Instructions (Addendum)
See calendar for specific medication instructions and bring it back for each and every office visit for every healthcare provider you see.  Without it,  you may not receive the best quality medical care that we feel you deserve.  You will note that the calendar groups together  your maintenance  medications that are timed at particular times of the day.  Think of this as your checklist for what your doctor has instructed you to do until your next evaluation to see what benefit  there is  to staying on a consistent group of medications intended to keep you well.  The other group at the bottom is entirely up to you to use as you see fit  for specific symptoms that may arise between visits that require you to treat them on an as needed basis.  Think of this as your action plan or "what if" list.   Separating the top medications from the bottom group is fundamental to providing you adequate care going forward.    Please schedule a follow up visit in 3 months but call sooner if needed to see Tammy on return with all medications in hand     schedule your CPX for after 07/10/12

## 2011-11-13 DIAGNOSIS — K219 Gastro-esophageal reflux disease without esophagitis: Secondary | ICD-10-CM | POA: Insufficient documentation

## 2011-11-13 NOTE — Assessment & Plan Note (Signed)
-   Goal LDL < 130 as former smoker pos fm hx   Lab Results  Component Value Date   LDLCALC 81 07/11/2011   on one half Zetia daily > no change rx

## 2011-11-13 NOTE — Assessment & Plan Note (Signed)
Adequate control on present rx, reviewed just use the 20 mg per day.  However, Explained natural history of uri and why it's necessary in patients at risk to treat GERD aggressively  at least  short term   to reduce risk of evolving cyclical cough initially  triggered by epithelial injury and a heightened sensitivty to the effects of any upper airway irritants,  most importantly acid - related.  That is, the more sensitive the epithelium damaged for virus, the more the cough, the more the secondary reflux (especially in those prone to reflux) the more the irritation of the sensitive mucosa and so on in a cyclical pattern. So when she starts coughing should double the dose of prilosec until cough is gone.

## 2011-12-16 ENCOUNTER — Encounter: Payer: Self-pay | Admitting: Internal Medicine

## 2012-01-21 ENCOUNTER — Telehealth: Payer: Self-pay | Admitting: Internal Medicine

## 2012-01-21 MED ORDER — ALPRAZOLAM 0.5 MG PO TABS: ORAL_TABLET | ORAL | Status: DC

## 2012-01-21 NOTE — Telephone Encounter (Signed)
Rx refill called to pharm for alprazolam #90 with no refills

## 2012-02-12 ENCOUNTER — Ambulatory Visit (INDEPENDENT_AMBULATORY_CARE_PROVIDER_SITE_OTHER): Payer: Commercial Managed Care - PPO | Admitting: Adult Health

## 2012-02-12 ENCOUNTER — Encounter: Payer: Self-pay | Admitting: Adult Health

## 2012-02-12 ENCOUNTER — Encounter: Payer: Self-pay | Admitting: Internal Medicine

## 2012-02-12 ENCOUNTER — Ambulatory Visit (AMBULATORY_SURGERY_CENTER): Payer: Commercial Managed Care - PPO | Admitting: *Deleted

## 2012-02-12 VITALS — Ht 62.0 in | Wt 138.0 lb

## 2012-02-12 VITALS — BP 120/80 | HR 82 | Temp 97.6°F | Ht 62.0 in | Wt 138.8 lb

## 2012-02-12 DIAGNOSIS — Z1211 Encounter for screening for malignant neoplasm of colon: Secondary | ICD-10-CM

## 2012-02-12 DIAGNOSIS — K219 Gastro-esophageal reflux disease without esophagitis: Secondary | ICD-10-CM

## 2012-02-12 DIAGNOSIS — E785 Hyperlipidemia, unspecified: Secondary | ICD-10-CM

## 2012-02-12 DIAGNOSIS — J449 Chronic obstructive pulmonary disease, unspecified: Secondary | ICD-10-CM

## 2012-02-12 MED ORDER — MOVIPREP 100 G PO SOLR: ORAL | Status: DC

## 2012-02-18 NOTE — Assessment & Plan Note (Signed)
Compensated w/out flare  

## 2012-02-18 NOTE — Assessment & Plan Note (Signed)
Controlled on regimen GERD diet

## 2012-02-18 NOTE — Patient Instructions (Signed)
Continue on same meds  Follow med calendar closely and bring to each visit.

## 2012-02-18 NOTE — Assessment & Plan Note (Signed)
Advised on diet and exercise. 

## 2012-02-18 NOTE — Progress Notes (Signed)
Subjective:   Patient ID: [name], female DOB: 1955-11-17  MRN: [id number]  HPI  85 yowf quit smoking 01/2010 with GOLD I COPD at that point And h/o hyperlipidemia and chronic bronchitis complicated by severe hemoptysis but neg fob x 2 last one 10/18/08   Sep 02, 2008 to ED with recurrent hemoptysis. At least 2 cups of blood bright red. admit thru May 4 with FOB 5/3 clot over rul with neg cytology.  10/18/08 neg fob    January 31, 2010 NP follow up and med review. Had PFT showed FEV1 at 2.55L/m (112% of predicted). She is doing well with no smoking. Now working at [employer]. Exercising regularly, walking 2 miles a day. Back is feeling better but still sore esp in evening time. We discussed good posture and rest breaks with computer work.   07/11/2011 f/u ov/[name] cc cpx one episode of cp of after v8 resolved p rx of reflux and diet restrictions. No sob or cough,  Minimal pnds at hs no purulent sputum or sob or even sign cough rec Increase prilosec to 40 mg Take 30-60 min before first meal of the day  See calendar for specific medication instructions   11/12/2011 f/u ov/[name] here for f/u lipids/ chronic rhinitis/ gerdcc Patient states reflux better since last visit. States no episodes recently. Denies cough, wheezing, chest pain,and chest tightness - only using the omeprazole 20mg  daily, not 40 mg as per recs and calendar, because rx wasn't changed on bottle.   02/12/12 Follow up and med review  We reviewed all her med list and updated her med calendar  Appears to be taking her meds correctly.  No complaints today, doing well .  No chest pain , edema, wt loss, n/v/d.    Past Medical History:  Hemoptysis/epistaxis August 31, 2008 ..............................................Marland KitchenWert  - FOB 09/04/2008 clot lodged over rul orifice, CT chest: no mass neg PE  - FOB 10/18/08 negative  GERD (ICD-530.81)  - Neg EGD 5/99  - Neg 24h ph study 9/99  POLYP, COLON  (ICD-211.3)............................................................Marland KitchenMarina Goodell  - Colonoscopy 07/07/03  - Colonoscopy 10/31/08 : 1) Adenomatous Polyps, multiple (6) removed, Severe diverticulosis  RHINITIS, CHRONIC (ICD-472.0)  HYPERLIPIDEMIA (ICD-272.4)  - Target LDL < 130 (pos fm hx, pos smoking hx)  COPD GOLD I  - PFT's 01/31/10 FEV1 2.55 (112%) ratio 66, no change with B2 and DLC0 85%  HEALTH MAINTENANCE..................................................................Marland KitchenWert  - dt 06/2004 - pneumovax 06/2005  - DEXA nl 08/18/06  - CPX 07/11/2011  -med review 02/12/12    Family History:  Coronary Heart Disease female < 55 ftather  Osteoporosis and emphysema, mother  neg lung ca  melanoma in dad   Social History:  Quit smoking Oct 2012 occ alcohol  married  2 children  Works for [employer]               Objective:   Physical Exam     Amb wf NAD  wt 129 Sep 22, 2008  > 136 May 08, 2009> > 10/07/2010 133 > 07/11/2011 136 > 11/12/2011  136>138 10/10  HEENT: nl dentition, turbinates, external ear canals without cough reflex, NM pale w/ clear d/ch,  Neck without JVD/Nodes/TM  Lungs CTA no wheezing or crackles.  RRR no s3 or murmur or increase in P2. No edema  Abd soft and benign with nl excursion in the supine position. No bruits or organomegaly  Ext without clubbing, cyanosis, calf tenderness  Skin: new tattoo on back  Pulses  sym both feet but reduced  MS exam nl gait and station no deformities or restrictions    CXR  07/11/2011 :  COPD. No acute cardiopulmonary disease      Assessment & Plan:

## 2012-02-20 NOTE — Addendum Note (Signed)
Addended by: [name] E on: 02/20/2012 05:22 PM   Modules accepted: Orders

## 2012-02-26 ENCOUNTER — Ambulatory Visit (AMBULATORY_SURGERY_CENTER): Payer: Commercial Managed Care - PPO | Admitting: Internal Medicine

## 2012-02-26 ENCOUNTER — Encounter: Payer: Self-pay | Admitting: Internal Medicine

## 2012-02-26 VITALS — BP 137/78 | HR 60 | Temp 96.9°F | Resp 23 | Ht 62.0 in | Wt 138.0 lb

## 2012-02-26 DIAGNOSIS — Z1211 Encounter for screening for malignant neoplasm of colon: Secondary | ICD-10-CM

## 2012-02-26 DIAGNOSIS — Z86010 Personal history of colonic polyps: Secondary | ICD-10-CM

## 2012-02-26 MED ORDER — SODIUM CHLORIDE 0.9 % IV SOLN: 500.0000 mL | INTRAVENOUS | Status: DC

## 2012-02-26 NOTE — Patient Instructions (Signed)
YOU HAD AN ENDOSCOPIC PROCEDURE TODAY AT THE [facility]: Refer to the procedure report that was given to you for any specific questions about what was found during the examination.  If the procedure report does not answer your questions, please call your gastroenterologist to clarify.  If you requested that your care partner not be given the details of your procedure findings, then the procedure report has been included in a sealed envelope for you to review at your convenience later.  YOU SHOULD EXPECT: Some feelings of bloating in the abdomen. Passage of more gas than usual.  Walking can help get rid of the air that was put into your GI tract during the procedure and reduce the bloating. If you had a lower endoscopy (such as a colonoscopy or flexible sigmoidoscopy) you may notice spotting of blood in your stool or on the toilet paper. If you underwent a bowel prep for your procedure, then you may not have a normal bowel movement for a few days.  DIET: Your first meal following the procedure should be a light meal and then it is ok to progress to your normal diet.  A half-sandwich or bowl of soup is an example of a good first meal.  Heavy or fried foods are harder to digest and may make you feel nauseous or bloated.  Likewise meals heavy in dairy and vegetables can cause extra gas to form and this can also increase the bloating.  Drink plenty of fluids but you should avoid alcoholic beverages for 24 hours.  ACTIVITY: Your care partner should take you home directly after the procedure.  You should plan to take it easy, moving slowly for the rest of the day.  You can resume normal activity the day after the procedure however you should NOT DRIVE or use heavy machinery for 24 hours (because of the sedation medicines used during the test).    SYMPTOMS TO REPORT IMMEDIATELY: A gastroenterologist can be reached at any hour.  During normal business hours, 8:30 AM to 5:00 PM Monday through Friday,  call [phone].  After hours and on weekends, please call the GI answering service at [phone] who will take a message and have the physician on call contact you.   Following lower endoscopy (colonoscopy or flexible sigmoidoscopy):  Excessive amounts of blood in the stool  Significant tenderness or worsening of abdominal pains  Swelling of the abdomen that is new, acute  Fever of 100F or higher    FOLLOW UP: If any biopsies were taken you will be contacted by phone or by letter within the next 1-3 weeks.  Call your gastroenterologist if you have not heard about the biopsies in 3 weeks.  Our staff will call the home number listed on your records the next business day following your procedure to check on you and address any questions or concerns that you may have at that time regarding the information given to you following your procedure. This is a courtesy call and so if there is no answer at the home number and we have not heard from you through the emergency physician on call, we will assume that you have returned to your regular daily activities without incident.  SIGNATURES/CONFIDENTIALITY: You and/or your care partner have signed paperwork which will be entered into your electronic medical record.  These signatures attest to the fact that that the information above on your After Visit Summary has been reviewed and is understood.  Full responsibility of the confidentiality   of this discharge information lies with you and/or your care-partner.     

## 2012-02-26 NOTE — Progress Notes (Signed)
Propofol per M Smith CRNA, see scanned intra procedure report. All meds titrated per CRNA during procedure. ewm 

## 2012-02-26 NOTE — Op Note (Signed)
[facility] [address] [place], [address]   COLONOSCOPY PROCEDURE REPORT  PATIENT: Leafy, Motsinger  MR#: [id number] BIRTHDATE: [birth year] , 56  yrs. old GENDER: Female ENDOSCOPIST: Roxy Cedar, MD REFERRED [id number] Program Recall PROCEDURE DATE:  02/26/2012 PROCEDURE:   Colonoscopy, surveillance ASA CLASS:   Class II INDICATIONS:patient's personal history of adenomatous colon polyps (index 2005 (TA); F/u 2010 --5 TAs). MEDICATIONS: MAC sedation, administered by CRNA and propofol (Diprivan) 230mg  IV  DESCRIPTION OF PROCEDURE:   After the risks benefits and alternatives of the procedure were thoroughly explained, informed consent was obtained.  A digital rectal exam revealed no abnormalities of the rectum.   The LB [id number]  endoscope was introduced through the anus and advanced to the cecum, which was identified by both the appendix and ileocecal valve. No adverse events experienced.   The quality of the prep was excellent, using MoviPrep  The instrument was then slowly withdrawn as the colon was fully examined.      COLON FINDINGS: Severe diverticulosis was noted The finding was in the left colon.   The colon was otherwise normal.  There was no inflammation, polyps or cancers.  Retroflexed views revealed internal hemorrhoids. The time to cecum=4 minutes 09 seconds. Withdrawal time=12 minutes 08 seconds.  The scope was withdrawn and the procedure completed. COMPLICATIONS: There were no complications.  ENDOSCOPIC IMPRESSION: 1.   Severe diverticulosis was noted in the left colon 2.   The colon was otherwise normal  RECOMMENDATIONS: 1. Follow up colonoscopy in 5 years (multiple adenomas last exam)   eSigned:  Roxy Cedar, MD 02/26/2012 9:29 AM   cc: Nyoka Cowden, MD and The Patient

## 2012-02-26 NOTE — Progress Notes (Signed)
Patient did not experience any of the following events: a burn prior to discharge; a fall within the facility; wrong site/side/patient/procedure/implant event; or a hospital transfer or hospital admission upon discharge from the facility. (G8907) Patient did not have preoperative order for IV antibiotic SSI prophylaxis. (G8918)  

## 2012-02-27 ENCOUNTER — Telehealth: Payer: Self-pay | Admitting: *Deleted

## 2012-02-27 NOTE — Telephone Encounter (Signed)
  Follow up Call-  Call back number 02/26/2012  Post procedure Call Back phone  # [phone]  Permission to leave phone message Yes     Patient questions:  Do you have a fever, pain , or abdominal swelling? yes Pain Score  2 *  Have you tolerated food without any problems? yes  Have you been able to return to your normal activities? yes  Do you have any questions about your discharge instructions: Diet   no Medications  no Follow up visit  no  Do you have questions or concerns about your Care? no  Actions: * If pain score is 4 or above: No action needed, pain <4.pt c/o still feeling like she had some gas pain with abd a little tight upper abd. Pt is @ work . Encouraged pt to drink warm coffee or tea today and take time to go sit on toilet and to stay away from gas producing foods today which we discussed. Instructed pt to call back if this discomfort does not improve today.

## 2012-03-17 ENCOUNTER — Encounter: Payer: Self-pay | Admitting: Internal Medicine

## 2012-04-18 ENCOUNTER — Other Ambulatory Visit: Payer: Self-pay | Admitting: Internal Medicine

## 2012-04-19 NOTE — Telephone Encounter (Signed)
Please advise if okay to refill thanks 

## 2012-04-20 ENCOUNTER — Other Ambulatory Visit: Payer: Self-pay | Admitting: Internal Medicine

## 2012-07-14 ENCOUNTER — Other Ambulatory Visit: Payer: Self-pay | Admitting: Internal Medicine

## 2012-07-16 ENCOUNTER — Telehealth: Payer: Self-pay | Admitting: Internal Medicine

## 2012-07-16 MED ORDER — ALPRAZOLAM 0.5 MG PO TABS: ORAL_TABLET | ORAL | Status: AC

## 2012-07-16 NOTE — Telephone Encounter (Signed)
Ok but due annual  cpx effective 07/10/12 so needs to set up

## 2012-07-16 NOTE — Telephone Encounter (Signed)
I spoke with pt. She is awaiting a call back from her insurance and will call us back for appt. rx has been called in. Nothing further was needed

## 2012-07-16 NOTE — Telephone Encounter (Signed)
Pt is requesting refill on alprazolam 0.5 MG. Last refilled 04/20/12 #90 x 0 refills Last OV 11/12/11 no pending OV She is going to check with her insurance to see if they will still cover MW.  Please advise Dr.[name] thanks

## 2012-07-17 ENCOUNTER — Other Ambulatory Visit: Payer: Self-pay | Admitting: Internal Medicine

## 2012-07-27 ENCOUNTER — Telehealth: Payer: Self-pay | Admitting: Internal Medicine

## 2012-07-27 NOTE — Telephone Encounter (Signed)
lmomtcb x1 for pt 

## 2012-07-28 ENCOUNTER — Other Ambulatory Visit: Payer: Self-pay | Admitting: Internal Medicine

## 2012-07-28 NOTE — Telephone Encounter (Signed)
lmomtcb for pt at # provided above  This # is listed as pt's home and cell #. Does she have a preference on another PCP?

## 2012-07-28 NOTE — Telephone Encounter (Signed)
I spoke with pt. She is requesting to see a female PCP. She is requesting referral from [facility]. Please advise Dr. [name] thanks

## 2012-07-29 ENCOUNTER — Other Ambulatory Visit: Payer: Self-pay | Admitting: Obstetrics and Gynecology

## 2012-07-29 NOTE — Telephone Encounter (Signed)
Dr Bayard Hugger in [place], Dr Fabian Sharp in [place]. Dr Beverely Low in [place] I believe are all taking new pts. I think there's one also at the [facility] site but can't recall the name  They are all excellent choices

## 2012-07-29 NOTE — Telephone Encounter (Signed)
lmomtcb for pt 

## 2012-07-30 NOTE — Telephone Encounter (Signed)
I spoke with pt and is aware of MW recs. She voiced her understanding and needed nothing further.

## 2012-07-30 NOTE — Telephone Encounter (Signed)
Pt returned call.  She stated she can't answer her phone while @ work and would like a detailed message left on her vm. Leanora Ivanoff

## 2012-07-30 NOTE — Telephone Encounter (Signed)
[email or web address].Jennifer Castillo, CMA  

## 2012-08-18 ENCOUNTER — Telehealth: Payer: Self-pay | Admitting: Internal Medicine

## 2012-08-18 NOTE — Telephone Encounter (Signed)
Attempted to call pt x's 3 to make next ov per recall.  PT never returned calls.  Mailed recall letter 08/18/12. Emily E McAlister °

## 2012-12-15 ENCOUNTER — Ambulatory Visit
Admission: RE | Admit: 2012-12-15 | Discharge: 2012-12-15 | Disposition: A | Discharge status: Home / Self Care | Payer: Commercial Managed Care - PPO | Source: Ambulatory Visit | Attending: Family Medicine | Admitting: Family Medicine

## 2012-12-15 ENCOUNTER — Other Ambulatory Visit: Payer: Self-pay | Admitting: Family Medicine

## 2012-12-15 DIAGNOSIS — M542 Cervicalgia: Secondary | ICD-10-CM

## 2013-08-24 ENCOUNTER — Other Ambulatory Visit: Payer: Self-pay | Admitting: Obstetrics and Gynecology

## 2013-12-26 ENCOUNTER — Encounter: Payer: Self-pay | Admitting: Internal Medicine

## 2014-02-20 ENCOUNTER — Inpatient Hospital Stay (HOSPITAL_COMMUNITY): Admission: RE | Admit: 2014-02-20 | Payer: Commercial Managed Care - PPO | Source: Ambulatory Visit

## 2014-02-22 ENCOUNTER — Encounter (HOSPITAL_COMMUNITY): Payer: Self-pay | Admitting: Certified Registered Nurse Anesthetist

## 2014-02-23 ENCOUNTER — Inpatient Hospital Stay (HOSPITAL_COMMUNITY): Admission: RE | Admit: 2014-02-23 | Payer: Commercial Managed Care - PPO | Source: Ambulatory Visit

## 2014-02-24 ENCOUNTER — Encounter (HOSPITAL_COMMUNITY): Admission: RE | Payer: Self-pay | Source: Ambulatory Visit

## 2014-02-24 ENCOUNTER — Ambulatory Visit (HOSPITAL_COMMUNITY)
Admission: RE | Admit: 2014-02-24 | Payer: PRIVATE HEALTH INSURANCE | Source: Ambulatory Visit | Admitting: Otolaryngology

## 2014-02-24 SURGERY — LARYNGOSCOPY, DIRECT, WITH CALCIUM HYDROXYLAPATITE MICROSPHERE INJECTION
Anesthesia: General

## 2014-08-29 ENCOUNTER — Other Ambulatory Visit: Payer: Self-pay | Admitting: Obstetrics and Gynecology

## 2014-08-30 LAB — CYTOLOGY - PAP

## 2015-05-03 DIAGNOSIS — K5900 Constipation, unspecified: Secondary | ICD-10-CM | POA: Insufficient documentation

## 2015-05-03 DIAGNOSIS — R32 Unspecified urinary incontinence: Secondary | ICD-10-CM | POA: Insufficient documentation

## 2015-09-06 DIAGNOSIS — L988 Other specified disorders of the skin and subcutaneous tissue: Secondary | ICD-10-CM | POA: Insufficient documentation

## 2016-05-24 ENCOUNTER — Inpatient Hospital Stay (HOSPITAL_COMMUNITY)
Admission: EM | Admit: 2016-05-24 | Discharge: 2016-05-27 | DRG: 390 | Disposition: A | Discharge status: Home / Self Care | Payer: BLUE CROSS/BLUE SHIELD | Attending: Internal Medicine | Admitting: Internal Medicine

## 2016-05-24 ENCOUNTER — Emergency Department (HOSPITAL_COMMUNITY): Payer: BLUE CROSS/BLUE SHIELD

## 2016-05-24 ENCOUNTER — Encounter (HOSPITAL_COMMUNITY): Payer: Self-pay

## 2016-05-24 DIAGNOSIS — E876 Hypokalemia: Secondary | ICD-10-CM | POA: Diagnosis present

## 2016-05-24 DIAGNOSIS — Z79899 Other long term (current) drug therapy: Secondary | ICD-10-CM | POA: Diagnosis not present

## 2016-05-24 DIAGNOSIS — Z9049 Acquired absence of other specified parts of digestive tract: Secondary | ICD-10-CM

## 2016-05-24 DIAGNOSIS — K56609 Unspecified intestinal obstruction, unspecified as to partial versus complete obstruction: Secondary | ICD-10-CM | POA: Diagnosis present

## 2016-05-24 DIAGNOSIS — J310 Chronic rhinitis: Secondary | ICD-10-CM | POA: Diagnosis present

## 2016-05-24 DIAGNOSIS — Z7982 Long term (current) use of aspirin: Secondary | ICD-10-CM | POA: Diagnosis not present

## 2016-05-24 DIAGNOSIS — J449 Chronic obstructive pulmonary disease, unspecified: Secondary | ICD-10-CM | POA: Diagnosis present

## 2016-05-24 DIAGNOSIS — Z793 Long term (current) use of hormonal contraceptives: Secondary | ICD-10-CM | POA: Diagnosis not present

## 2016-05-24 DIAGNOSIS — Z7951 Long term (current) use of inhaled steroids: Secondary | ICD-10-CM | POA: Diagnosis not present

## 2016-05-24 DIAGNOSIS — E785 Hyperlipidemia, unspecified: Secondary | ICD-10-CM | POA: Diagnosis present

## 2016-05-24 DIAGNOSIS — Z87891 Personal history of nicotine dependence: Secondary | ICD-10-CM

## 2016-05-24 DIAGNOSIS — Z825 Family history of asthma and other chronic lower respiratory diseases: Secondary | ICD-10-CM | POA: Diagnosis not present

## 2016-05-24 DIAGNOSIS — Q899 Congenital malformation, unspecified: Secondary | ICD-10-CM | POA: Diagnosis not present

## 2016-05-24 DIAGNOSIS — M5417 Radiculopathy, lumbosacral region: Secondary | ICD-10-CM | POA: Diagnosis present

## 2016-05-24 DIAGNOSIS — F419 Anxiety disorder, unspecified: Secondary | ICD-10-CM | POA: Diagnosis present

## 2016-05-24 DIAGNOSIS — Z86010 Personal history of colonic polyps: Secondary | ICD-10-CM

## 2016-05-24 DIAGNOSIS — K219 Gastro-esophageal reflux disease without esophagitis: Secondary | ICD-10-CM | POA: Diagnosis present

## 2016-05-24 HISTORY — DX: Chronic obstructive pulmonary disease, unspecified: J44.9

## 2016-05-24 HISTORY — DX: Unspecified osteoarthritis, unspecified site: M19.90

## 2016-05-24 LAB — URINALYSIS, ROUTINE W REFLEX MICROSCOPIC
Bilirubin Urine: NEGATIVE
Glucose, UA: 50 mg/dL — AB
Hgb urine dipstick: NEGATIVE
Ketones, ur: 5 mg/dL — AB
Leukocytes, UA: NEGATIVE
Nitrite: NEGATIVE
Protein, ur: 30 mg/dL — AB
Specific Gravity, Urine: 1.026 (ref 1.005–1.030)
pH: 5 (ref 5.0–8.0)

## 2016-05-24 LAB — CBC
HCT: 43.6 % (ref 36.0–46.0)
HCT: 44.5 % (ref 36.0–46.0)
Hemoglobin: 14.8 g/dL (ref 12.0–15.0)
Hemoglobin: 15.2 g/dL — ABNORMAL HIGH (ref 12.0–15.0)
MCH: 30.3 pg (ref 26.0–34.0)
MCH: 30.5 pg (ref 26.0–34.0)
MCHC: 33.9 g/dL (ref 30.0–36.0)
MCHC: 34.2 g/dL (ref 30.0–36.0)
MCV: 89.2 fL (ref 78.0–100.0)
MCV: 89.4 fL (ref 78.0–100.0)
Platelets: 250 10*3/uL (ref 150–400)
Platelets: 270 10*3/uL (ref 150–400)
RBC: 4.89 MIL/uL (ref 3.87–5.11)
RBC: 4.98 MIL/uL (ref 3.87–5.11)
RDW: 13.3 % (ref 11.5–15.5)
RDW: 13.2 % (ref 11.5–15.5)
WBC: 10.3 10*3/uL (ref 4.0–10.5)
WBC: 9.8 10*3/uL (ref 4.0–10.5)

## 2016-05-24 LAB — MAGNESIUM: Magnesium: 2.3 mg/dL (ref 1.7–2.4)

## 2016-05-24 LAB — COMPREHENSIVE METABOLIC PANEL
ALT: 21 U/L (ref 14–54)
AST: 24 U/L (ref 15–41)
Albumin: 4.5 g/dL (ref 3.5–5.0)
Alkaline Phosphatase: 71 U/L (ref 38–126)
Anion gap: 11 (ref 5–15)
BUN: 14 mg/dL (ref 6–20)
CO2: 23 mmol/L (ref 22–32)
Calcium: 9.6 mg/dL (ref 8.9–10.3)
Chloride: 98 mmol/L — ABNORMAL LOW (ref 101–111)
Creatinine, Ser: 0.62 mg/dL (ref 0.44–1.00)
GFR calc Af Amer: >60 mL/min (ref >60)
GFR calc non Af Amer: >60 mL/min (ref >60)
Glucose, Bld: 152 mg/dL — ABNORMAL HIGH (ref 65–99)
Potassium: 3.7 mmol/L (ref 3.5–5.1)
Sodium: 132 mmol/L — ABNORMAL LOW (ref 135–145)
Total Bilirubin: 1.1 mg/dL (ref 0.3–1.2)
Total Protein: 7.1 g/dL (ref 6.5–8.1)

## 2016-05-24 LAB — TROPONIN I: Troponin I: <0.03 ng/mL (ref <0.03)

## 2016-05-24 LAB — CREATININE, SERUM
Creatinine, Ser: 0.71 mg/dL (ref 0.44–1.00)
GFR calc Af Amer: >60 mL/min (ref >60)
GFR calc non Af Amer: >60 mL/min (ref >60)

## 2016-05-24 LAB — LIPASE, BLOOD: Lipase: 16 U/L (ref 11–51)

## 2016-05-24 LAB — PHOSPHORUS: Phosphorus: 3.4 mg/dL (ref 2.5–4.6)

## 2016-05-24 MED ORDER — ACETAMINOPHEN 650 MG RE SUPP: 650.0000 mg | Freq: Four times a day (QID) | RECTAL | Status: DC | PRN

## 2016-05-24 MED ORDER — ONDANSETRON HCL 4 MG PO TABS: 4.0000 mg | ORAL_TABLET | Freq: Four times a day (QID) | ORAL | Status: DC | PRN

## 2016-05-24 MED ORDER — MORPHINE SULFATE (PF) 4 MG/ML IV SOLN
4.0000 mg | Freq: Once | INTRAVENOUS | Status: AC
  Administered 2016-05-24: 4 mg via INTRAVENOUS
  Filled 2016-05-24: qty 1

## 2016-05-24 MED ORDER — ONDANSETRON HCL 4 MG/2ML IJ SOLN: 4.0000 mg | Freq: Four times a day (QID) | INTRAMUSCULAR | Status: DC | PRN

## 2016-05-24 MED ORDER — LORAZEPAM 2 MG/ML IJ SOLN
0.5000 mg | Freq: Three times a day (TID) | INTRAMUSCULAR | Status: DC | PRN
  Administered 2016-05-24 – 2016-05-27 (×7): 0.5 mg via INTRAVENOUS
  Filled 2016-05-24 (×8): qty 1

## 2016-05-24 MED ORDER — HYDRALAZINE HCL 20 MG/ML IJ SOLN: 10.0000 mg | Freq: Three times a day (TID) | INTRAMUSCULAR | Status: DC | PRN

## 2016-05-24 MED ORDER — ACETAMINOPHEN 325 MG PO TABS: 650.0000 mg | ORAL_TABLET | Freq: Four times a day (QID) | ORAL | Status: DC | PRN

## 2016-05-24 MED ORDER — HEPARIN SODIUM (PORCINE) 5000 UNIT/ML IJ SOLN
5000.0000 [IU] | Freq: Three times a day (TID) | INTRAMUSCULAR | Status: DC
  Administered 2016-05-24 – 2016-05-27 (×8): 5000 [IU] via SUBCUTANEOUS
  Filled 2016-05-24 (×8): qty 1

## 2016-05-24 MED ORDER — ONDANSETRON HCL 4 MG/2ML IJ SOLN
4.0000 mg | Freq: Once | INTRAMUSCULAR | Status: AC
Start: 2016-05-24 — End: 2016-05-24
  Administered 2016-05-24: 4 mg via INTRAVENOUS
  Filled 2016-05-24: qty 2

## 2016-05-24 MED ORDER — PANTOPRAZOLE SODIUM 40 MG IV SOLR
40.0000 mg | Freq: Two times a day (BID) | INTRAVENOUS | Status: DC
  Administered 2016-05-24 – 2016-05-27 (×6): 40 mg via INTRAVENOUS
  Filled 2016-05-24 (×6): qty 40

## 2016-05-24 MED ORDER — FLUTICASONE PROPIONATE 50 MCG/ACT NA SUSP
1.0000 | Freq: Every day | NASAL | Status: DC
  Administered 2016-05-26 – 2016-05-27 (×2): 1 via NASAL
  Filled 2016-05-24: qty 16

## 2016-05-24 MED ORDER — KETOROLAC TROMETHAMINE 15 MG/ML IJ SOLN
15.0000 mg | Freq: Four times a day (QID) | INTRAMUSCULAR | Status: DC | PRN
  Administered 2016-05-24 – 2016-05-27 (×8): 15 mg via INTRAVENOUS
  Filled 2016-05-24 (×8): qty 1

## 2016-05-24 MED ORDER — SODIUM CHLORIDE 0.9 % IV SOLN
INTRAVENOUS | Status: DC
  Administered 2016-05-24 (×2): via INTRAVENOUS

## 2016-05-24 MED ORDER — IOPAMIDOL (ISOVUE-300) INJECTION 61%
INTRAVENOUS | Status: AC
  Administered 2016-05-24: 100 mL
  Filled 2016-05-24: qty 100

## 2016-05-24 MED ORDER — ALBUTEROL SULFATE (2.5 MG/3ML) 0.083% IN NEBU: 2.5000 mg | INHALATION_SOLUTION | RESPIRATORY_TRACT | Status: DC | PRN

## 2016-05-24 NOTE — ED Notes (Signed)
MD Delo at the bedside.  

## 2016-05-24 NOTE — ED Triage Notes (Signed)
Pt reports yesterday she had abd bloating. She reports she began having spasms in her belly, chest and her back. Pt reports she has had decreased PO intake and approximately 3 episodes of emesis.

## 2016-05-24 NOTE — ED Provider Notes (Signed)
[facility] DEPT Provider Note   CSN: [id number] Arrival date & time: 05/24/16  0807     History   Chief Complaint Chief Complaint  Patient presents with  . Spasms    HPI [name] is a 61 y.o. female.  Patient is a 61 year old female with history of hysterectomy, anxiety. She presents for evaluation of abdominal pain. This started yesterday. Her pain is generalized and comes "in waves". She reports nausea and some retching, but no definite vomiting. She denies any fevers or chills. She denies any bloody stools.   The history is provided by the patient.  Abdominal Pain   This is a new problem. The problem occurs constantly. The problem has been gradually worsening. The pain is associated with eating. The pain is located in the generalized abdominal region. The pain is severe. Associated symptoms include anorexia and belching. Pertinent negatives include fever. Nothing aggravates the symptoms. Nothing relieves the symptoms.    Past Medical History:  Diagnosis Date  . Anxiety   . Bronchitis   . Cancer ([place])    basal cell  . Colon polyp   . GERD (gastroesophageal reflux disease)   . Hemoptysis   . Hyperlipidemia   . Rhinitis     Patient Active Problem List   Diagnosis Date Noted  . SBO (small bowel obstruction) 05/24/2016  . GERD (gastroesophageal reflux disease) 11/13/2011  . Osteopenia 10/07/2010  . COPD UNSPECIFIED 10/02/2009  . SLEEP DISORDER UNSPECIFIED 01/16/2009  . LUMBOSACRAL RADICULOPATHY 09/21/2007  . POLYP, COLON 03/04/2007  . HYPERLIPIDEMIA 03/04/2007  . RHINITIS, CHRONIC 03/04/2007  . GERD 03/04/2007    Past Surgical History:  Procedure Laterality Date  . Bladder prolapse repair  11/08  . VESICOVAGINAL FISTULA CLOSURE W/ TAH  1989    OB History    No data available       Home Medications    Prior to Admission medications   Medication Sig Start Date End Date Taking? Authorizing Provider  ALPRAZolam Duanne Moron) 0.5 MG tablet TAKE 1  TABLET BY MOUTH EVERY 6 HOURS AS NEEDED 07/16/12  Yes Tanda Rockers, MD  Ascorbic Acid (VITAMIN C) 500 MG tablet Take 500 mg by mouth daily.     Yes Historical Provider, MD  aspirin EC 81 MG tablet Take 81 mg by mouth daily.   Yes Historical Provider, MD  atorvastatin (LIPITOR) 40 MG tablet Take 40 mg by mouth daily at 6 PM.   Yes Historical Provider, MD  Calcium Carbonate-Vitamin D (CALCIUM PLUS VITAMIN D PO) Take 1 capsule by mouth 2 (two) times daily.     Yes Historical Provider, MD  estradiol (VIVELLE-DOT) 0.1 MG/24HR Place 1 patch onto the skin 2 (two) times a week.     Yes Historical Provider, MD  mometasone (NASONEX) 50 MCG/ACT nasal spray Place 2 sprays into the nose daily. 0-2 puffs each nostril every morning and 1-2 puffs at bedtime    Yes Tanda Rockers, MD  montelukast (SINGULAIR) 10 MG tablet Take 10 mg by mouth at bedtime.   08/19/10 05/24/17 Yes Tanda Rockers, MD  Multiple Vitamin (MULTIVITAMIN) capsule Take 1 capsule by mouth daily.     Yes Historical Provider, MD  omeprazole (PRILOSEC) 20 MG capsule TAKE 1 CAPSULE BY MOUTH 30 - 60 MINUTES BEFORE THE FIRST BIG MEAL OF THE DAY 07/17/12  Yes Tanda Rockers, MD  oxybutynin (DITROPAN-XL) 10 MG 24 hr tablet Take 10 mg by mouth at bedtime.   Yes Historical Provider, MD  polyethylene  glycol (MIRALAX / GLYCOLAX) packet Take 17 g by mouth daily.    Yes Historical Provider, MD  Mometasone Furo-Formoterol Fum (DULERA) 100-5 MCG/ACT AERO Take 2 puffs first thing in am and then another 2 puffs about 12 hours later if short or breath or coughing   08/19/10   Tanda Rockers, MD  valACYclovir (VALTREX) 500 MG tablet 2 every day as needed     Historical Provider, MD  VYTORIN 10-80 MG per tablet TAKE 1 TABLET BY MOUTH DAILY AT BEDTIME 07/28/12   Tanda Rockers, MD    Family History Family History  Problem Relation Age of Onset  . Coronary artery disease Father   . Emphysema Mother   . Osteoporosis Mother     Social History Social History    Substance Use Topics  . Smoking status: Former Smoker    Packs/day: 1.50    Years: 35.00    Types: Cigarettes    Quit date: 01/03/2010  . Smokeless tobacco: Never Used  . Alcohol use No     Comment: occ     Allergies   Patient has no known allergies.   Review of Systems Review of Systems  Constitutional: Negative for fever.  Gastrointestinal: Positive for abdominal pain and anorexia.  All other systems reviewed and are negative.    Physical Exam Updated Vital Signs BP 128/60   Pulse 88   Temp 98.5 F (36.9 C) (Oral)   Resp 19   SpO2 97%   Physical Exam  Constitutional: She is oriented to person, place, and time. She appears well-developed and well-nourished. No distress.  HENT:  Head: Normocephalic and atraumatic.  Neck: Normal range of motion. Neck supple.  Cardiovascular: Normal rate and regular rhythm.  Exam reveals no gallop and no friction rub.   No murmur heard. Pulmonary/Chest: Effort normal and breath sounds normal. No respiratory distress. She has no wheezes.  Abdominal: Soft. Bowel sounds are normal. She exhibits no distension. There is tenderness. There is no rebound and no guarding.  Wrist tenderness to palpation in all 4 quadrants  Musculoskeletal: Normal range of motion.  Neurological: She is alert and oriented to person, place, and time.  Skin: Skin is warm and dry. She is not diaphoretic.  Nursing note and vitals reviewed.    ED Treatments / Results  Labs (all labs ordered are listed, but only abnormal results are displayed) Labs Reviewed  COMPREHENSIVE METABOLIC PANEL - Abnormal; Notable for the following:       Result Value   Sodium 132 (*)    Chloride 98 (*)    Glucose, Bld 152 (*)    All other components within normal limits  URINALYSIS, ROUTINE W REFLEX MICROSCOPIC - Abnormal; Notable for the following:    APPearance HAZY (*)    Glucose, UA 50 (*)    Ketones, ur 5 (*)    Protein, ur 30 (*)    Bacteria, UA RARE (*)    Squamous  Epithelial / LPF 0-5 (*)    All other components within normal limits  LIPASE, BLOOD  CBC  MAGNESIUM  PHOSPHORUS  TROPONIN I  CBC  CREATININE, SERUM    EKG  EKG Interpretation None       Radiology Ct Abdomen Pelvis W Contrast  Result Date: 05/24/2016 CLINICAL DATA:  Abdominal bloating. EXAM: CT ABDOMEN AND PELVIS WITH CONTRAST TECHNIQUE: Multidetector CT imaging of the abdomen and pelvis was performed using the standard protocol following bolus administration of intravenous contrast. CONTRAST:  177mL ISOVUE-300  IOPAMIDOL (ISOVUE-300) INJECTION 61% COMPARISON:  None FINDINGS: Lower chest: No acute abnormality. Hepatobiliary: No focal liver abnormality is seen. No gallstones, gallbladder wall thickening, or biliary dilatation. Pancreas: Unremarkable. No pancreatic ductal dilatation or surrounding inflammatory changes. Spleen: Normal in size without focal abnormality. Adrenals/Urinary Tract: The adrenal glands are normal. Unremarkable appearance of the right kidney. Several small hypodensities within the left kidney are too small to reliably characterize. No mass or hydronephrosis. Stomach/Bowel: Normal appearance of the stomach. The duodenal jejunal junction is in an atypical location within the midline of the abdomen. The mid small bowel loops are increased in caliber with multiple fluid levels. There is mild wall thickening involving the dilated small bowel loops within the right hemiabdomen. Distal small bowel is decreased in caliber. No pathologic dilatation of the colon. There is soft tissue stranding/edema within the small bowel mesenteric. There is no evidence for pneumatosis, portal venous gas or pneumoperitoneum. Vascular/Lymphatic: Aortic atherosclerosis. No enlarged retroperitoneal or mesenteric adenopathy. No enlarged pelvic or inguinal lymph nodes. Reproductive: Status post hysterectomy. No adnexal masses. Other: No free fluid or fluid collections. Musculoskeletal: Degenerative disc  disease is identified within the lumbar spine. IMPRESSION: 1. Examination is positive for small bowel obstruction affecting the mid small bowel. There is associated mild wall thickening and mesenteric edema. No pneumatosis, portal venous gas or pneumoperitoneum. 2. 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 3. Aortic atherosclerosis. 4. Lumbar spondylosis. Electronically Signed   By: Kerby Moors M.D.   On: 05/24/2016 11:51    Procedures Procedures (including critical care time)  Medications Ordered in ED Medications  heparin injection 5,000 Units (not administered)  0.9 %  sodium chloride infusion (not administered)  acetaminophen (TYLENOL) tablet 650 mg (not administered)    Or  acetaminophen (TYLENOL) suppository 650 mg (not administered)  ketorolac (TORADOL) 15 MG/ML injection 15 mg (not administered)  ondansetron (ZOFRAN) tablet 4 mg (not administered)    Or  ondansetron (ZOFRAN) injection 4 mg (not administered)  morphine 4 MG/ML injection 4 mg (4 mg Intravenous Given 05/24/16 1031)  ondansetron (ZOFRAN) injection 4 mg (4 mg Intravenous Given 05/24/16 1023)  iopamidol (ISOVUE-300) 61 % injection (100 mLs  Contrast Given 05/24/16 1102)     Initial Impression / Assessment and Plan / ED Course  I have reviewed the triage vital signs and the nursing notes.  Pertinent labs & imaging results that were available during my care of the patient were reviewed by me and considered in my medical decision making (see chart for details).     X-rays show a small bowel obstruction. The patient will be admitted to the hospitalist service with general surgery consultation. An NG tube will be placed.  Final Clinical Impressions(s) / ED Diagnoses   Final diagnoses:  None    New Prescriptions New Prescriptions   No medications on file     Veryl Speak, MD 05/26/16 1501

## 2016-05-24 NOTE — H&P (Signed)
[facility] History and Physical  [name] [id number] DOB: 22-Dec-1955 DOA: 05/24/2016  Referring physician: PCP: Lilian Coma, MD   Chief Complaint: "My stomach started to grow so big."  HPI: [name] is a 61 y.o. female  with past medical history of anxiety, reflux, high cholesterol and appendectomy presented to the emergency room with chief complaint of abdominal distention and pain. Patient states that she started to have acute onset of abdominal bloating and pain yesterday after lunch. Patient denies any abdominal trauma. No prior history of bowel obstruction. Patient had increasing nausea over time. And discomfort prompted her to come to the emergency room for evaluation. Patient characterizes her abdominal pain is severe spasms in the epigastrium that also occur in the region around thoracolumbar spine.  ED course: CT scan done showing small bowel obstruction. Hospitalist consulted for admission.   Review of Systems:  As per HPI otherwise 10 point review of systems negative.    Past Medical History:  Diagnosis Date  . Anxiety   . Bronchitis   . Cancer ([place])    basal cell  . Colon polyp   . GERD (gastroesophageal reflux disease)   . Hemoptysis   . Hyperlipidemia   . Rhinitis    Past Surgical History:  Procedure Laterality Date  . Bladder prolapse repair  11/08  . VESICOVAGINAL FISTULA CLOSURE W/ TAH  1989   Social History:  reports that she quit smoking about 6 years ago. Her smoking use included Cigarettes. She has a 52.50 pack-year smoking history. She has never used smokeless tobacco. She reports that she does not drink alcohol or use drugs.  No Known Allergies  Family History  Problem Relation Age of Onset  . Coronary artery disease Father   . Emphysema Mother   . Osteoporosis Mother      Prior to Admission medications   Medication Sig Start Date End Date Taking? Authorizing Provider  ALPRAZolam Duanne Moron) 0.5 MG tablet TAKE 1 TABLET BY  MOUTH EVERY 6 HOURS AS NEEDED 07/16/12  Yes Tanda Rockers, MD  Ascorbic Acid (VITAMIN C) 500 MG tablet Take 500 mg by mouth daily.     Yes Historical Provider, MD  aspirin EC 81 MG tablet Take 81 mg by mouth daily.   Yes Historical Provider, MD  atorvastatin (LIPITOR) 40 MG tablet Take 40 mg by mouth daily at 6 PM.   Yes Historical Provider, MD  Calcium Carbonate-Vitamin D (CALCIUM PLUS VITAMIN D PO) Take 1 capsule by mouth 2 (two) times daily.     Yes Historical Provider, MD  estradiol (VIVELLE-DOT) 0.1 MG/24HR Place 1 patch onto the skin 2 (two) times a week.     Yes Historical Provider, MD  mometasone (NASONEX) 50 MCG/ACT nasal spray Place 2 sprays into the nose daily. 0-2 puffs each nostril every morning and 1-2 puffs at bedtime    Yes Tanda Rockers, MD  montelukast (SINGULAIR) 10 MG tablet Take 10 mg by mouth at bedtime.   08/19/10 05/24/17 Yes Tanda Rockers, MD  Multiple Vitamin (MULTIVITAMIN) capsule Take 1 capsule by mouth daily.     Yes Historical Provider, MD  omeprazole (PRILOSEC) 20 MG capsule TAKE 1 CAPSULE BY MOUTH 30 - 60 MINUTES BEFORE THE FIRST BIG MEAL OF THE DAY 07/17/12  Yes Tanda Rockers, MD  oxybutynin (DITROPAN-XL) 10 MG 24 hr tablet Take 10 mg by mouth at bedtime.   Yes Historical Provider, MD  polyethylene glycol (MIRALAX / GLYCOLAX) packet Take 17 g  by mouth daily.    Yes Historical Provider, MD  Mometasone Furo-Formoterol Fum (DULERA) 100-5 MCG/ACT AERO Take 2 puffs first thing in am and then another 2 puffs about 12 hours later if short or breath or coughing   Patient not taking: Reported on 05/24/2016 08/19/10   Tanda Rockers, MD  valACYclovir (VALTREX) 500 MG tablet Take 1,000 mg by mouth daily as needed (outbreak). 2 every day as needed     Historical Provider, MD  VYTORIN 10-80 MG per tablet TAKE 1 TABLET BY MOUTH DAILY AT BEDTIME Patient not taking: Reported on 05/24/2016 07/28/12   Tanda Rockers, MD   Physical Exam: Vitals:   05/24/16 0824 05/24/16 1007 05/24/16  1120  BP: 131/72 147/81 128/60  Pulse: 86 80 88  Resp: 16 16 19   Temp: 97.8 F (36.6 C) 98.5 F (36.9 C)   TempSrc: Oral Oral   SpO2: 94% 97% 97%    Wt Readings from Last 3 Encounters:  02/26/12 62.6 kg (138 lb)  02/12/12 63 kg (138 lb 12.8 oz)  02/12/12 62.6 kg (138 lb)    General:  Appears calm and comfortable; Alert and oriented 3, GCS 15 Eyes:  PERRL, EOMI, normal lids, iris ENT:  grossly normal hearing, lips & tongue Neck:  no LAD, masses or thyromegaly Cardiovascular:  RRR, no m/r/g. No LE edema.  Respiratory:  CTA bilaterally, no w/r/r. Normal respiratory effort. Abdomen:  Full, mildly distended, no rebound, no bowel sounds Skin:  no rash or induration seen on limited exam Musculoskeletal:  grossly normal tone BUE/BLE Psychiatric:  grossly normal mood and affect, speech fluent and appropriate Neurologic:  CN 2-12 grossly intact, moves all extremities in coordinated fashion.          Labs on Admission:  Basic Metabolic Panel:  Recent Labs Lab 05/24/16 0834  NA 132*  K 3.7  CL 98*  CO2 23  GLUCOSE 152*  BUN 14  CREATININE 0.62  CALCIUM 9.6   Liver Function Tests:  Recent Labs Lab 05/24/16 0834  AST 24  ALT 21  ALKPHOS 71  BILITOT 1.1  PROT 7.1  ALBUMIN 4.5    Recent Labs Lab 05/24/16 0834  LIPASE 16   No results for input(s): AMMONIA in the last 168 hours. CBC:  Recent Labs Lab 05/24/16 0834  WBC 10.3  HGB 14.8  HCT 43.6  MCV 89.2  PLT 250   Cardiac Enzymes: No results for input(s): CKTOTAL, CKMB, CKMBINDEX, TROPONINI in the last 168 hours.  BNP (last 3 results) No results for input(s): BNP in the last 8760 hours.  ProBNP (last 3 results) No results for input(s): PROBNP in the last 8760 hours.   Creatinine clearance cannot be calculated (Unknown ideal weight.)  CBG: No results for input(s): GLUCAP in the last 168 hours.  Radiological Exams on Admission: Ct Abdomen Pelvis W Contrast  Result Date: 05/24/2016 CLINICAL  DATA:  Abdominal bloating. EXAM: CT ABDOMEN AND PELVIS WITH CONTRAST TECHNIQUE: Multidetector CT imaging of the abdomen and pelvis was performed using the standard protocol following bolus administration of intravenous contrast. CONTRAST:  165mL ISOVUE-300 IOPAMIDOL (ISOVUE-300) INJECTION 61% COMPARISON:  None FINDINGS: Lower chest: No acute abnormality. Hepatobiliary: No focal liver abnormality is seen. No gallstones, gallbladder wall thickening, or biliary dilatation. Pancreas: Unremarkable. No pancreatic ductal dilatation or surrounding inflammatory changes. Spleen: Normal in size without focal abnormality. Adrenals/Urinary Tract: The adrenal glands are normal. Unremarkable appearance of the right kidney. Several small hypodensities within the left kidney are too  small to reliably characterize. No mass or hydronephrosis. Stomach/Bowel: Normal appearance of the stomach. The duodenal jejunal junction is in an atypical location within the midline of the abdomen. The mid small bowel loops are increased in caliber with multiple fluid levels. There is mild wall thickening involving the dilated small bowel loops within the right hemiabdomen. Distal small bowel is decreased in caliber. No pathologic dilatation of the colon. There is soft tissue stranding/edema within the small bowel mesenteric. There is no evidence for pneumatosis, portal venous gas or pneumoperitoneum. Vascular/Lymphatic: Aortic atherosclerosis. No enlarged retroperitoneal or mesenteric adenopathy. No enlarged pelvic or inguinal lymph nodes. Reproductive: Status post hysterectomy. No adnexal masses. Other: No free fluid or fluid collections. Musculoskeletal: Degenerative disc disease is identified within the lumbar spine. IMPRESSION: 1. Examination is positive for small bowel obstruction affecting the mid small bowel. There is associated mild wall thickening and mesenteric edema. No pneumatosis, portal venous gas or pneumoperitoneum. 2. 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 3. Aortic atherosclerosis. 4. Lumbar spondylosis. Electronically Signed   By: Kerby Moors M.D.   On: 05/24/2016 11:51    EKG: Independently reviewed. Ventricular rate 84, PR interval 144, QRS duration 76, QTC 47; normal sinus rhythm, no STEMI-compared to 2013 March EKG no acute ST changes noted  Assessment/Plan Principal Problem:   SBO (small bowel obstruction) Active Problems:   HLD (hyperlipidemia)   COPD (chronic obstructive pulmonary disease) (HCC)   GERD   Radiculopathy of lumbosacral region  Small bowel obstruction Likely due to patient's congenital abnormality and history of appendectomy NG tube be placed General surgery consulted by EDP IV fluids We'll monitor electrolytes NPO  COPD When necessary albuterol  GERD IV Protonix  Radiculopathy, lumbar region When necessary Toradol  Anxiety Prn ativan IV  HLD Hold statin  Code Status: FULL  DVT Prophylaxis: Heparin Family Communication: Son at bedside Disposition Plan: Pending Improvement  Status: Obs Med-surg  Elwin Mocha, MD Family Medicine [facility] [email or web address] Password [email or web address]

## 2016-05-24 NOTE — ED Notes (Signed)
Family at bedside. 

## 2016-05-25 DIAGNOSIS — K56609 Unspecified intestinal obstruction, unspecified as to partial versus complete obstruction: Principal | ICD-10-CM

## 2016-05-25 LAB — BASIC METABOLIC PANEL
Anion gap: 8 (ref 5–15)
BUN: 20 mg/dL (ref 6–20)
CO2: 28 mmol/L (ref 22–32)
Calcium: 8.7 mg/dL — ABNORMAL LOW (ref 8.9–10.3)
Chloride: 104 mmol/L (ref 101–111)
Creatinine, Ser: 0.78 mg/dL (ref 0.44–1.00)
GFR calc Af Amer: >60 mL/min (ref >60)
GFR calc non Af Amer: >60 mL/min (ref >60)
Glucose, Bld: 118 mg/dL — ABNORMAL HIGH (ref 65–99)
Potassium: 3.4 mmol/L — ABNORMAL LOW (ref 3.5–5.1)
Sodium: 140 mmol/L (ref 135–145)

## 2016-05-25 LAB — CBC
HCT: 42 % (ref 36.0–46.0)
Hemoglobin: 13.7 g/dL (ref 12.0–15.0)
MCH: 30.2 pg (ref 26.0–34.0)
MCHC: 32.6 g/dL (ref 30.0–36.0)
MCV: 92.5 fL (ref 78.0–100.0)
Platelets: 215 10*3/uL (ref 150–400)
RBC: 4.54 MIL/uL (ref 3.87–5.11)
RDW: 13.7 % (ref 11.5–15.5)
WBC: 5.4 10*3/uL (ref 4.0–10.5)

## 2016-05-25 MED ORDER — POTASSIUM CHLORIDE IN NACL 40-0.9 MEQ/L-% IV SOLN
INTRAVENOUS | Status: DC
  Administered 2016-05-25 – 2016-05-26 (×3): 75 mL/h via INTRAVENOUS
  Filled 2016-05-25 (×5): qty 1000

## 2016-05-25 NOTE — Progress Notes (Signed)
PROGRESS NOTE                                                                                                                                                                                                             Patient Demographics:    [name], is a 61 y.o. female, DOB - [birth year], [id number]  Admit date - 05/24/2016   Admitting Physician Elwin Mocha, MD  Outpatient Primary MD for the patient is Lilian Coma, MD  LOS - 1  Chief Complaint  Patient presents with  . Spasms       Brief Narrative  [name] is a 61 y.o. female  with past medical history of anxiety, reflux, high cholesterol and appendectomy presented to the emergency room with chief complaint of abdominal distention and pain due to SBO.   Subjective:    [name] today has, No headache, No chest pain, No abdominal pain - No Nausea, No new weakness tingling or numbness, No Cough - SOB.     Assessment  & Plan :     1.Small bowel obstruction. NG in place, passing some flatness, abdominal pain and distention much improved, currently nothing by mouth with IV fluids, surgery following. Defer management of this problem to surgery.  2. GERD. IV PPI  3. COPD. Stable at baseline, no wheezing. Supportive care.  4. Anxiety. As needed IV Ativan.  5. Hypokalemia. Replaced.  6. Dyslipidemia. Resume statin once taking oral diet.   Family Communication  :  None  Code Status :  Full  Diet : Diet NPO time specified   Disposition Plan  :  TBD  Consults  :  CCS  Procedures  :    DVT Prophylaxis  :    Heparin    Lab Results  Component Value Date   PLT 215 05/25/2016    Inpatient Medications  Scheduled Meds: . fluticasone  1 spray Each Nare Daily  . heparin  5,000 Units Subcutaneous Q8H  . pantoprazole (PROTONIX) IV  40 mg Intravenous Q12H   Continuous Infusions: . 0.9 % NaCl with KCl 40 mEq / L     PRN  Meds:.acetaminophen **OR** acetaminophen, albuterol, hydrALAZINE, ketorolac, LORazepam, ondansetron **OR** ondansetron (ZOFRAN) IV  Antibiotics  :    Anti-infectives    None         Objective:  Vitals:   05/24/16 1658 05/25/16 0039 05/25/16 0500 05/25/16 0516  BP: 125/63 (!) 105/54  (!) 98/51  Pulse: 98 84  73  Resp: 20 16  19   Temp: 98 F (36.7 C) 97.7 F (36.5 C)  97.6 F (36.4 C)  TempSrc: Oral   Oral  SpO2: 96% 95%  97%  Weight:   63.8 kg (140 lb 9.6 oz)   Height:        Wt Readings from Last 3 Encounters:  05/25/16 63.8 kg (140 lb 9.6 oz)  02/26/12 62.6 kg (138 lb)  02/12/12 63 kg (138 lb 12.8 oz)     Intake/Output Summary (Last 24 hours) at 05/25/16 1124 Last data filed at 05/25/16 0645  Gross per 24 hour  Intake             1730 ml  Output              510 ml  Net             1220 ml     Physical Exam  Awake Alert, Oriented X 3, No new F.N deficits, Normal affect [place].AT,PERRAL Supple Neck,No JVD, No cervical lymphadenopathy appriciated.  Symmetrical Chest wall movement, Good air movement bilaterally, CTAB RRR,No Gallops,Rubs or new Murmurs, No Parasternal Heave Hypoactiveve B.Sounds, Abd Soft, No tenderness, No organomegaly appriciated, No rebound - guarding or rigidity. No Cyanosis, Clubbing or edema, No new Rash or bruise      Data Review:    CBC  Recent Labs Lab 05/24/16 0834 05/24/16 1252 05/25/16 0554  WBC 10.3 9.8 5.4  HGB 14.8 15.2* 13.7  HCT 43.6 44.5 42.0  PLT 250 270 215  MCV 89.2 89.4 92.5  MCH 30.3 30.5 30.2  MCHC 33.9 34.2 32.6  RDW 13.3 13.2 13.7    Chemistries   Recent Labs Lab 05/24/16 0834 05/24/16 1252 05/25/16 0554  NA 132*  --  140  K 3.7  --  3.4*  CL 98*  --  104  CO2 23  --  28  GLUCOSE 152*  --  118*  BUN 14  --  20  CREATININE 0.62 0.71 0.78  CALCIUM 9.6  --  8.7*  MG  --  2.3  --   AST 24  --   --   ALT 21  --   --   ALKPHOS 71  --   --   BILITOT 1.1  --   --     ------------------------------------------------------------------------------------------------------------------ No results for input(s): CHOL, HDL, LDLCALC, TRIG, CHOLHDL, LDLDIRECT in the last 72 hours.  No results found for: HGBA1C ------------------------------------------------------------------------------------------------------------------ No results for input(s): TSH, T4TOTAL, T3FREE, THYROIDAB in the last 72 hours.  Invalid input(s): FREET3 ------------------------------------------------------------------------------------------------------------------ No results for input(s): VITAMINB12, FOLATE, FERRITIN, TIBC, IRON, RETICCTPCT in the last 72 hours.  Coagulation profile No results for input(s): INR, PROTIME in the last 168 hours.  No results for input(s): DDIMER in the last 72 hours.  Cardiac Enzymes  Recent Labs Lab 05/24/16 1252  TROPONINI <0.03   ------------------------------------------------------------------------------------------------------------------ No results found for: BNP  Micro Results No results found for this or any previous visit (from the past 240 hour(s)).  Radiology Reports Ct Abdomen Pelvis W Contrast  Result Date: 05/24/2016 CLINICAL DATA:  Abdominal bloating. EXAM: CT ABDOMEN AND PELVIS WITH CONTRAST TECHNIQUE: Multidetector CT imaging of the abdomen and pelvis was performed using the standard protocol following bolus administration of intravenous contrast. CONTRAST:  1101mL ISOVUE-300 IOPAMIDOL (ISOVUE-300) INJECTION 61% COMPARISON:  None FINDINGS:  Lower chest: No acute abnormality. Hepatobiliary: No focal liver abnormality is seen. No gallstones, gallbladder wall thickening, or biliary dilatation. Pancreas: Unremarkable. No pancreatic ductal dilatation or surrounding inflammatory changes. Spleen: Normal in size without focal abnormality. Adrenals/Urinary Tract: The adrenal glands are normal. Unremarkable appearance of the right kidney.  Several small hypodensities within the left kidney are too small to reliably characterize. No mass or hydronephrosis. Stomach/Bowel: Normal appearance of the stomach. The duodenal jejunal junction is in an atypical location within the midline of the abdomen. The mid small bowel loops are increased in caliber with multiple fluid levels. There is mild wall thickening involving the dilated small bowel loops within the right hemiabdomen. Distal small bowel is decreased in caliber. No pathologic dilatation of the colon. There is soft tissue stranding/edema within the small bowel mesenteric. There is no evidence for pneumatosis, portal venous gas or pneumoperitoneum. Vascular/Lymphatic: Aortic atherosclerosis. No enlarged retroperitoneal or mesenteric adenopathy. No enlarged pelvic or inguinal lymph nodes. Reproductive: Status post hysterectomy. No adnexal masses. Other: No free fluid or fluid collections. Musculoskeletal: Degenerative disc disease is identified within the lumbar spine. IMPRESSION: 1. Examination is positive for small bowel obstruction affecting the mid small bowel. There is associated mild wall thickening and mesenteric edema. No pneumatosis, portal venous gas or pneumoperitoneum. 2. 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 3. Aortic atherosclerosis. 4. Lumbar spondylosis. Electronically Signed   By: Kerby Moors M.D.   On: 05/24/2016 11:51    Time Spent in minutes  30   Ree Alcalde K M.D on 05/25/2016 at 11:24 AM  Between 7am to 7pm - Pager - [phone]  After 7pm go to [email or web address] - password [facility]  [facility] -  Office  [phone]

## 2016-05-26 ENCOUNTER — Inpatient Hospital Stay (HOSPITAL_COMMUNITY): Payer: BLUE CROSS/BLUE SHIELD

## 2016-05-26 LAB — CBC
HCT: 36.3 % (ref 36.0–46.0)
Hemoglobin: 11.9 g/dL — ABNORMAL LOW (ref 12.0–15.0)
MCH: 30.4 pg (ref 26.0–34.0)
MCHC: 32.8 g/dL (ref 30.0–36.0)
MCV: 92.6 fL (ref 78.0–100.0)
Platelets: 163 10*3/uL (ref 150–400)
RBC: 3.92 MIL/uL (ref 3.87–5.11)
RDW: 13.4 % (ref 11.5–15.5)
WBC: 5 10*3/uL (ref 4.0–10.5)

## 2016-05-26 LAB — BASIC METABOLIC PANEL
Anion gap: 9 (ref 5–15)
BUN: 17 mg/dL (ref 6–20)
CO2: 24 mmol/L (ref 22–32)
Calcium: 8.3 mg/dL — ABNORMAL LOW (ref 8.9–10.3)
Chloride: 109 mmol/L (ref 101–111)
Creatinine, Ser: 0.64 mg/dL (ref 0.44–1.00)
GFR calc Af Amer: >60 mL/min (ref >60)
GFR calc non Af Amer: >60 mL/min (ref >60)
Glucose, Bld: 91 mg/dL (ref 65–99)
Potassium: 3.7 mmol/L (ref 3.5–5.1)
Sodium: 142 mmol/L (ref 135–145)

## 2016-05-26 LAB — MAGNESIUM: Magnesium: 2.1 mg/dL (ref 1.7–2.4)

## 2016-05-26 MED ORDER — ZOLPIDEM TARTRATE 5 MG PO TABS: 5.0000 mg | ORAL_TABLET | Freq: Every evening | ORAL | Status: DC | PRN

## 2016-05-26 MED ORDER — DIATRIZOATE MEGLUMINE & SODIUM 66-10 % PO SOLN
90.0000 mL | Freq: Once | ORAL | Status: AC
  Administered 2016-05-26: 90 mL via NASOGASTRIC
  Filled 2016-05-26: qty 90

## 2016-05-26 NOTE — Progress Notes (Signed)
PROGRESS NOTE                                                                                                                                                                                                             Patient Demographics:    [name], is a 61 y.o. female, DOB - 1955-12-27, [id number]  Admit date - 05/24/2016   Admitting Physician Elwin Mocha, MD  Outpatient Primary MD for the patient is Lilian Coma, MD  LOS - 2  Chief Complaint  Patient presents with  . Spasms       Brief Narrative  [name] is a 61 y.o. female  with past medical history of anxiety, reflux, high cholesterol and appendectomy presented to the emergency room with chief complaint of abdominal distention and pain due to SBO.   Subjective:    [name] today has, No headache, No chest pain, No abdominal pain - No Nausea, No new weakness tingling or numbness, No Cough - SOB.  She has had 2 bowel movements and is passing flatus now.   Assessment  & Plan :     1.Small bowel obstruction. NG in place, Had 2 bowel movements and passing flatus, abdominal pain and distention much improved, currently nothing by mouth with IV fluids, still has NG tube, on x-ray still has some small bowel dilation. Defer management of this problem to surgery. Likely if surgery is okay we can try an suppository and clears later today with possible discharge tomorrow.  2. GERD. IV PPI  3. COPD. Stable at baseline, no wheezing. Supportive care.  4. Anxiety. As needed IV Ativan.  5. Hypokalemia. Replaced and stable.  6. Dyslipidemia. Resume statin once taking oral diet.   Family Communication  :  Sr. bedside  Code Status :  Full  Diet : Diet NPO time specified   Disposition Plan  :  TBD  Consults  :  CCS  Procedures  :    DVT Prophylaxis  :    Heparin    Lab Results  Component Value Date   PLT 215 05/25/2016    Inpatient  Medications  Scheduled Meds: . fluticasone  1 spray Each Nare Daily  . heparin  5,000 Units Subcutaneous Q8H  . pantoprazole (PROTONIX) IV  40 mg Intravenous Q12H   Continuous Infusions: . 0.9 %  NaCl with KCl 40 mEq / L 75 mL/hr (05/26/16 0124)   PRN Meds:.acetaminophen **OR** acetaminophen, albuterol, hydrALAZINE, ketorolac, LORazepam, ondansetron **OR** ondansetron (ZOFRAN) IV  Antibiotics  :    Anti-infectives    None         Objective:   Vitals:   05/25/16 1407 05/25/16 2042 05/26/16 0500 05/26/16 0545  BP: (!) 115/58 (!) 117/47  (!) 112/42  Pulse: 87 81  79  Resp: 16 18  18   Temp: 97.9 F (36.6 C) 98.1 F (36.7 C)  97.9 F (36.6 C)  TempSrc: Oral Oral  Oral  SpO2: 100% 97%  98%  Weight:   64.9 kg (143 lb 1.6 oz)   Height:        Wt Readings from Last 3 Encounters:  05/26/16 64.9 kg (143 lb 1.6 oz)  02/26/12 62.6 kg (138 lb)  02/12/12 63 kg (138 lb 12.8 oz)     Intake/Output Summary (Last 24 hours) at 05/26/16 1004 Last data filed at 05/26/16 0827  Gross per 24 hour  Intake           1297.5 ml  Output              501 ml  Net            796.5 ml     Physical Exam  Awake Alert, Oriented X 3, No new F.N deficits, Normal affect [place].AT,PERRAL Supple Neck,No JVD, No cervical lymphadenopathy appriciated.  Symmetrical Chest wall movement, Good air movement bilaterally, CTAB RRR,No Gallops,Rubs or new Murmurs, No Parasternal Heave Hypoactiveve B.Sounds, Abd Soft, No tenderness, No organomegaly appriciated, No rebound - guarding or rigidity. No Cyanosis, Clubbing or edema, No new Rash or bruise      Data Review:    CBC  Recent Labs Lab 05/24/16 0834 05/24/16 1252 05/25/16 0554  WBC 10.3 9.8 5.4  HGB 14.8 15.2* 13.7  HCT 43.6 44.5 42.0  PLT 250 270 215  MCV 89.2 89.4 92.5  MCH 30.3 30.5 30.2  MCHC 33.9 34.2 32.6  RDW 13.3 13.2 13.7    Chemistries   Recent Labs Lab 05/24/16 0834 05/24/16 1252 05/25/16 0554  NA 132*  --  140  K 3.7   --  3.4*  CL 98*  --  104  CO2 23  --  28  GLUCOSE 152*  --  118*  BUN 14  --  20  CREATININE 0.62 0.71 0.78  CALCIUM 9.6  --  8.7*  MG  --  2.3  --   AST 24  --   --   ALT 21  --   --   ALKPHOS 71  --   --   BILITOT 1.1  --   --    ------------------------------------------------------------------------------------------------------------------ No results for input(s): CHOL, HDL, LDLCALC, TRIG, CHOLHDL, LDLDIRECT in the last 72 hours.  No results found for: HGBA1C ------------------------------------------------------------------------------------------------------------------ No results for input(s): TSH, T4TOTAL, T3FREE, THYROIDAB in the last 72 hours.  Invalid input(s): FREET3 ------------------------------------------------------------------------------------------------------------------ No results for input(s): VITAMINB12, FOLATE, FERRITIN, TIBC, IRON, RETICCTPCT in the last 72 hours.  Coagulation profile No results for input(s): INR, PROTIME in the last 168 hours.  No results for input(s): DDIMER in the last 72 hours.  Cardiac Enzymes  Recent Labs Lab 05/24/16 1252  TROPONINI <0.03   ------------------------------------------------------------------------------------------------------------------ No results found for: BNP  Micro Results No results found for this or any previous visit (from the past 240 hour(s)).  Radiology Reports Ct Abdomen Pelvis W Contrast  Result  Date: 05/24/2016 CLINICAL DATA:  Abdominal bloating. EXAM: CT ABDOMEN AND PELVIS WITH CONTRAST TECHNIQUE: Multidetector CT imaging of the abdomen and pelvis was performed using the standard protocol following bolus administration of intravenous contrast. CONTRAST:  16mL ISOVUE-300 IOPAMIDOL (ISOVUE-300) INJECTION 61% COMPARISON:  None FINDINGS: Lower chest: No acute abnormality. Hepatobiliary: No focal liver abnormality is seen. No gallstones, gallbladder wall thickening, or biliary dilatation.  Pancreas: Unremarkable. No pancreatic ductal dilatation or surrounding inflammatory changes. Spleen: Normal in size without focal abnormality. Adrenals/Urinary Tract: The adrenal glands are normal. Unremarkable appearance of the right kidney. Several small hypodensities within the left kidney are too small to reliably characterize. No mass or hydronephrosis. Stomach/Bowel: Normal appearance of the stomach. The duodenal jejunal junction is in an atypical location within the midline of the abdomen. The mid small bowel loops are increased in caliber with multiple fluid levels. There is mild wall thickening involving the dilated small bowel loops within the right hemiabdomen. Distal small bowel is decreased in caliber. No pathologic dilatation of the colon. There is soft tissue stranding/edema within the small bowel mesenteric. There is no evidence for pneumatosis, portal venous gas or pneumoperitoneum. Vascular/Lymphatic: Aortic atherosclerosis. No enlarged retroperitoneal or mesenteric adenopathy. No enlarged pelvic or inguinal lymph nodes. Reproductive: Status post hysterectomy. No adnexal masses. Other: No free fluid or fluid collections. Musculoskeletal: Degenerative disc disease is identified within the lumbar spine. IMPRESSION: 1. Examination is positive for small bowel obstruction affecting the mid small bowel. There is associated mild wall thickening and mesenteric edema. No pneumatosis, portal venous gas or pneumoperitoneum. 2. 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 3. Aortic atherosclerosis. 4. Lumbar spondylosis. Electronically Signed   By: Kerby Moors M.D.   On: 05/24/2016 11:51   Dg Abd Portable 1v  Result Date: 05/26/2016 CLINICAL DATA:  Small bowel obstruction. EXAM: PORTABLE ABDOMEN - 1 VIEW COMPARISON:  CT scan of May 24, 2016. FINDINGS: Distal tip of nasogastric tube is seen in proximal stomach. Dilated small bowel loops are noted  concerning for distal small bowel obstruction. No colonic dilatation is noted. No abnormal calcifications are noted. IMPRESSION: Dilated small bowel loops are noted concerning for distal small bowel obstruction. Distal tip of nasogastric tube seen in proximal stomach. Electronically Signed   By: Marijo Conception, M.D.   On: 05/26/2016 08:27    Time Spent in minutes  30   Asberry Lascola K M.D on 05/26/2016 at 10:04 AM  Between 7am to 7pm - Pager - [phone]  After 7pm go to [email or web address] - password [facility]  [facility] -  Office  [phone]

## 2016-05-26 NOTE — Progress Notes (Signed)
Mrs. Rawn was given gastrogrifin via tube today at 1410, since then pt. has had 5 diarrhea stools, no odor noted.  Pt. very concerned.  Text paged surgery to inform.  Will await return call and continue to monitor. Alphonzo Lemmings, RN

## 2016-05-26 NOTE — Consult Note (Signed)
[facility] Surgery Consult/Admission Note  [name] 1955-08-24  [id number].    Requesting MD: Dr. Lala Lund Chief Complaint/Reason for Consult: SBO  HPI:   [name] is a 61 y.o. female  with past medical history of anxiety, reflux, high cholesterol and appendectomy at age 58, presented to the [facility] ED with chief complaint of abdominal distention and pain. Patient states that she started to have acute onset of abdominal bloating and pain Friday after lunch. No prior history of bowel obstruction. Patient had increasing nausea over time and vomiting. Patient described her abdominal pain as severe that started in her lower abdominal and moved into upper abdomin, radiated into her lower back, constant, she tried gas-ex without relief. Nothing made it better. Associated distention and vomiting. Pt continued to have normal bowel movements and flatus since the onset of pain. Currently the pt is feeling better. She states she went for a walk this morning and her tube was clamped for 20 minutes and she felt as if her abdomin became more bloated. She is not having any nausea. She had a BM this morning and is having flatus. She denies CP, SOB, fever, chills, dysuria, hematuria.  ED course: CT scan showed: small bowel obstruction affecting the mid small bowel. There is associated mild wall thickening and mesenteric edema. No pneumatosis, portal venous gas or pneumoperitoneum. 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  ROS:  Review of Systems  Constitutional: Negative for chills, diaphoresis and fever.  HENT: Negative for sore throat.   Eyes: Negative for discharge.  Respiratory: Negative for shortness of breath.   Cardiovascular: Negative for chest pain and leg swelling.  Gastrointestinal: Positive for abdominal pain, heartburn, nausea and vomiting. Negative for blood in stool, constipation and diarrhea.  Genitourinary: Negative  for dysuria and hematuria.  Skin: Negative for rash.  Neurological: Negative for dizziness, loss of consciousness and headaches.  All other systems reviewed and are negative.    Family History  Problem Relation Age of Onset  . Coronary artery disease Father   . Emphysema Mother   . Osteoporosis Mother     Past Medical History:  Diagnosis Date  . Anxiety   . Arthritis   . Bronchitis   . 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 Colon polyp   . COPD (chronic obstructive pulmonary disease) ([place])   . GERD (gastroesophageal reflux disease)   . Hemoptysis   . Hyperlipidemia   . Rhinitis     Past Surgical History:  Procedure Laterality Date  . APPENDECTOMY    . BACK SURGERY    . Bladder prolapse repair  11/08  . VESICOVAGINAL FISTULA CLOSURE W/ TAH  1989    Social History:  reports that she quit smoking about 6 years ago. Her smoking use included Cigarettes. She has a 52.50 pack-year smoking history. She has never used smokeless tobacco. She reports that she does not drink alcohol or use drugs.  Allergies: No Known Allergies  Medications Prior to Admission  Medication Sig Dispense Refill  . ALPRAZolam (XANAX) 0.5 MG tablet TAKE 1 TABLET BY MOUTH EVERY 6 HOURS AS NEEDED 90 tablet 0  . Ascorbic Acid (VITAMIN C) 500 MG tablet Take 500 mg by mouth daily.      Marland Kitchen aspirin EC 81 MG tablet Take 81 mg by mouth daily.    Marland Kitchen atorvastatin (LIPITOR) 40 MG tablet Take 40 mg by mouth daily at 6 PM.    .  Calcium Carbonate-Vitamin D (CALCIUM PLUS VITAMIN D PO) Take 1 capsule by mouth 2 (two) times daily.      Marland Kitchen estradiol (VIVELLE-DOT) 0.1 MG/24HR Place 1 patch onto the skin 2 (two) times a week.      . mometasone (NASONEX) 50 MCG/ACT nasal spray Place 2 sprays into the nose daily. 0-2 puffs each nostril every morning and 1-2 puffs at bedtime     . montelukast (SINGULAIR) 10 MG tablet Take 10 mg by mouth at bedtime.      . Multiple Vitamin (MULTIVITAMIN) capsule Take 1 capsule by mouth daily.      Marland Kitchen  omeprazole (PRILOSEC) 20 MG capsule TAKE 1 CAPSULE BY MOUTH 30 - 60 MINUTES BEFORE THE FIRST BIG MEAL OF THE DAY 30 capsule 11  . oxybutynin (DITROPAN-XL) 10 MG 24 hr tablet Take 10 mg by mouth at bedtime.    . polyethylene glycol (MIRALAX / GLYCOLAX) packet Take 17 g by mouth daily.     . Mometasone Furo-Formoterol Fum (DULERA) 100-5 MCG/ACT AERO Take 2 puffs first thing in am and then another 2 puffs about 12 hours later if short or breath or coughing   (Patient not taking: Reported on 05/24/2016)    . valACYclovir (VALTREX) 500 MG tablet Take 1,000 mg by mouth daily as needed (outbreak). 2 every day as needed     . VYTORIN 10-80 MG per tablet TAKE 1 TABLET BY MOUTH DAILY AT BEDTIME (Patient not taking: Reported on 05/24/2016) 30 tablet 5    Blood pressure (!) 112/42, pulse 79, temperature 97.9 F (36.6 C), temperature source Oral, resp. rate 18, height '5\' 2"'$  (1.575 m), weight 143 lb 1.6 oz (64.9 kg), SpO2 98 %.  Physical Exam  Constitutional: She is oriented to person, place, and time and well-developed, well-nourished, and in no distress. Vital signs are normal. No distress.  HENT:  Head: Normocephalic and atraumatic.  Nose: Nose normal.  Mouth/Throat: Oropharynx is clear and moist.  Eyes: Conjunctivae and EOM are normal. Pupils are equal, round, and reactive to light. Right eye exhibits no discharge. Left eye exhibits no discharge. No scleral icterus.  Neck: Normal range of motion. Neck supple.  Cardiovascular: Normal rate, regular rhythm, normal heart sounds and intact distal pulses.  Exam reveals no gallop and no friction rub.   No murmur heard. Pulses:      Radial pulses are 2+ on the right side, and 2+ on the left side.       Dorsalis pedis pulses are 2+ on the right side, and 2+ on the left side.  Pulmonary/Chest: Effort normal. No respiratory distress. She has no decreased breath sounds. She has no rhonchi.  Abdominal: Soft. Normal appearance. She exhibits distension (mild). She  exhibits no mass. Bowel sounds are hyperactive. There is generalized tenderness. There is no rigidity and no guarding.  Musculoskeletal: Normal range of motion. She exhibits no edema or deformity.  Neurological: She is alert and oriented to person, place, and time.  Skin: Skin is warm and dry. No rash noted. She is not diaphoretic.  Psychiatric: Mood and affect normal.  Nursing note and vitals reviewed.   Results for orders placed or performed during the hospital encounter of 05/24/16 (from the past 48 hour(s))  Magnesium     Status: None   Collection Time: 05/24/16 12:52 PM  Result Value Ref Range   Magnesium 2.3 1.7 - 2.4 mg/dL  Phosphorus     Status: None   Collection Time: 05/24/16 12:52 PM  Result  Value Ref Range   Phosphorus 3.4 2.5 - 4.6 mg/dL  Troponin I     Status: None   Collection Time: 05/24/16 12:52 PM  Result Value Ref Range   Troponin I <0.03 <0.03 ng/mL  CBC     Status: Abnormal   Collection Time: 05/24/16 12:52 PM  Result Value Ref Range   WBC 9.8 4.0 - 10.5 K/uL   RBC 4.98 3.87 - 5.11 MIL/uL   Hemoglobin 15.2 (H) 12.0 - 15.0 g/dL   HCT 44.5 36.0 - 46.0 %   MCV 89.4 78.0 - 100.0 fL   MCH 30.5 26.0 - 34.0 pg   MCHC 34.2 30.0 - 36.0 g/dL   RDW 13.2 11.5 - 15.5 %   Platelets 270 150 - 400 K/uL  Creatinine, serum     Status: None   Collection Time: 05/24/16 12:52 PM  Result Value Ref Range   Creatinine, Ser 0.71 0.44 - 1.00 mg/dL   GFR calc non Af Amer >60 >60 mL/min   GFR calc Af Amer >60 >60 mL/min    Comment: (NOTE) The eGFR has been calculated using the CKD EPI equation. This calculation has not been validated in all clinical situations. eGFR's persistently <60 mL/min signify possible Chronic Kidney Disease.   Basic metabolic panel     Status: Abnormal   Collection Time: 05/25/16  5:54 AM  Result Value Ref Range   Sodium 140 135 - 145 mmol/L    Comment: DELTA CHECK NOTED   Potassium 3.4 (L) 3.5 - 5.1 mmol/L   Chloride 104 101 - 111 mmol/L   CO2 28  22 - 32 mmol/L   Glucose, Bld 118 (H) 65 - 99 mg/dL   BUN 20 6 - 20 mg/dL   Creatinine, Ser 0.78 0.44 - 1.00 mg/dL   Calcium 8.7 (L) 8.9 - 10.3 mg/dL   GFR calc non Af Amer >60 >60 mL/min   GFR calc Af Amer >60 >60 mL/min    Comment: (NOTE) The eGFR has been calculated using the CKD EPI equation. This calculation has not been validated in all clinical situations. eGFR's persistently <60 mL/min signify possible Chronic Kidney Disease.    Anion gap 8 5 - 15  CBC     Status: None   Collection Time: 05/25/16  5:54 AM  Result Value Ref Range   WBC 5.4 4.0 - 10.5 K/uL   RBC 4.54 3.87 - 5.11 MIL/uL   Hemoglobin 13.7 12.0 - 15.0 g/dL   HCT 42.0 36.0 - 46.0 %   MCV 92.5 78.0 - 100.0 fL   MCH 30.2 26.0 - 34.0 pg   MCHC 32.6 30.0 - 36.0 g/dL   RDW 13.7 11.5 - 15.5 %   Platelets 215 150 - 400 K/uL  CBC     Status: Abnormal   Collection Time: 05/26/16  9:43 AM  Result Value Ref Range   WBC 5.0 4.0 - 10.5 K/uL   RBC 3.92 3.87 - 5.11 MIL/uL   Hemoglobin 11.9 (L) 12.0 - 15.0 g/dL   HCT 36.3 36.0 - 46.0 %   MCV 92.6 78.0 - 100.0 fL   MCH 30.4 26.0 - 34.0 pg   MCHC 32.8 30.0 - 36.0 g/dL   RDW 13.4 11.5 - 15.5 %   Platelets 163 150 - 400 K/uL   Ct Abdomen Pelvis W Contrast  Result Date: 05/24/2016 CLINICAL DATA:  Abdominal bloating. EXAM: CT ABDOMEN AND PELVIS WITH CONTRAST TECHNIQUE: Multidetector CT imaging of the abdomen and pelvis was performed using  the standard protocol following bolus administration of intravenous contrast. CONTRAST:  185m ISOVUE-300 IOPAMIDOL (ISOVUE-300) INJECTION 61% COMPARISON:  None FINDINGS: Lower chest: No acute abnormality. Hepatobiliary: No focal liver abnormality is seen. No gallstones, gallbladder wall thickening, or biliary dilatation. Pancreas: Unremarkable. No pancreatic ductal dilatation or surrounding inflammatory changes. Spleen: Normal in size without focal abnormality. Adrenals/Urinary Tract: The adrenal glands are normal. Unremarkable appearance of  the right kidney. Several small hypodensities within the left kidney are too small to reliably characterize. No mass or hydronephrosis. Stomach/Bowel: Normal appearance of the stomach. The duodenal jejunal junction is in an atypical location within the midline of the abdomen. The mid small bowel loops are increased in caliber with multiple fluid levels. There is mild wall thickening involving the dilated small bowel loops within the right hemiabdomen. Distal small bowel is decreased in caliber. No pathologic dilatation of the colon. There is soft tissue stranding/edema within the small bowel mesenteric. There is no evidence for pneumatosis, portal venous gas or pneumoperitoneum. Vascular/Lymphatic: Aortic atherosclerosis. No enlarged retroperitoneal or mesenteric adenopathy. No enlarged pelvic or inguinal lymph nodes. Reproductive: Status post hysterectomy. No adnexal masses. Other: No free fluid or fluid collections. Musculoskeletal: Degenerative disc disease is identified within the lumbar spine. IMPRESSION: 1. Examination is positive for small bowel obstruction affecting the mid small bowel. There is associated mild wall thickening and mesenteric edema. No pneumatosis, portal venous gas or pneumoperitoneum. 2. 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 3. Aortic atherosclerosis. 4. Lumbar spondylosis. Electronically Signed   By: TKerby MoorsM.D.   On: 05/24/2016 11:51   Dg Abd Portable 1v  Result Date: 05/26/2016 CLINICAL DATA:  Small bowel obstruction. EXAM: PORTABLE ABDOMEN - 1 VIEW COMPARISON:  CT scan of May 24, 2016. FINDINGS: Distal tip of nasogastric tube is seen in proximal stomach. Dilated small bowel loops are noted concerning for distal small bowel obstruction. No colonic dilatation is noted. No abnormal calcifications are noted. IMPRESSION: Dilated small bowel loops are noted concerning for distal small bowel obstruction. Distal tip of  nasogastric tube seen in proximal stomach. Electronically Signed   By: JMarijo Conception M.D.   On: 05/26/2016 08:27      Assessment/Plan    HLD (hyperlipidemia)   COPD (chronic obstructive pulmonary disease) (HCC)   GERD   Radiculopathy of lumbosacral region  Small bowel obstruction - Likely due to patient's congenital abnormality and history of appendectomy - pt is having BM's and flatus - clamp NGT and if she tolerates it recommend advancing her diet to clears this evening pending gastrografin study - Abd xray this morning still showing dilated small bowel loops concerning for SBO - SBO protocol with gastrografin today  We will continue to follow this pt. Thank you for the consult.   JKalman Drape PGreater [facility] 05/26/2016, 10:19 AM Pager: [phone]: [phone]-Fri 7:00 am-4:30 pm Sat-Sun 7:00 am-11:30 am

## 2016-05-26 NOTE — Progress Notes (Signed)
Return call from Dr. Cherlyn Roberts and stated she doesn't have a SBO anymore and it is normal to have diarrhea.  Will inform pt. And continue to monitor.  Alphonzo Lemmings, RN

## 2016-05-27 ENCOUNTER — Inpatient Hospital Stay (HOSPITAL_COMMUNITY): Payer: BLUE CROSS/BLUE SHIELD

## 2016-05-27 LAB — BASIC METABOLIC PANEL
Anion gap: 7 (ref 5–15)
BUN: 11 mg/dL (ref 6–20)
CO2: 21 mmol/L — ABNORMAL LOW (ref 22–32)
Calcium: 8.2 mg/dL — ABNORMAL LOW (ref 8.9–10.3)
Chloride: 112 mmol/L — ABNORMAL HIGH (ref 101–111)
Creatinine, Ser: 0.63 mg/dL (ref 0.44–1.00)
GFR calc Af Amer: >60 mL/min (ref >60)
GFR calc non Af Amer: >60 mL/min (ref >60)
Glucose, Bld: 73 mg/dL (ref 65–99)
Potassium: 4.6 mmol/L (ref 3.5–5.1)
Sodium: 140 mmol/L (ref 135–145)

## 2016-05-27 LAB — CBC
HCT: 34.9 % — ABNORMAL LOW (ref 36.0–46.0)
Hemoglobin: 11.2 g/dL — ABNORMAL LOW (ref 12.0–15.0)
MCH: 29.6 pg (ref 26.0–34.0)
MCHC: 32.1 g/dL (ref 30.0–36.0)
MCV: 92.3 fL (ref 78.0–100.0)
Platelets: 161 10*3/uL (ref 150–400)
RBC: 3.78 MIL/uL — ABNORMAL LOW (ref 3.87–5.11)
RDW: 13.4 % (ref 11.5–15.5)
WBC: 5 10*3/uL (ref 4.0–10.5)

## 2016-05-27 NOTE — Progress Notes (Signed)
[facility] Progress Note     Subjective: NAE KUB shows no obstruction  Objective: Vital signs in last 24 hours: Temp:  [97.4 F (36.3 C)-98.4 F (36.9 C)] 98.4 F (36.9 C) (01/23 0524) Pulse Rate:  [75-79] 75 (01/23 0524) Resp:  [17-20] 20 (01/23 0524) BP: (108-119)/(46-56) 109/46 (01/23 0524) SpO2:  [99 %-100 %] 99 % (01/23 0524) Last BM Date: 05/26/16  Intake/Output from previous day: 01/22 0701 - 01/23 0700 In: 900 [I.V.:900] Out: 1600 [Stool:1600] Intake/Output this shift: No intake/output data recorded.  PE: Gen:  Alert, NAD, pleasant, cooperative Card:  RRR, no M/G/R heard, 2 + radial pulses bilaterally Pulm:  CTA, no W/R/R, effort normal Abd: Soft, NT/ND, +BS, no HSM, incisions C/D/I, drain with minimal sanguinous drainage, no abdominal scars noted Skin: no rashes noted, warm and dry  Lab Results:   Recent Labs  05/26/16 0943 05/27/16 0723  WBC 5.0 5.0  HGB 11.9* 11.2*  HCT 36.3 34.9*  PLT 163 161   BMET  Recent Labs  05/26/16 0943 05/27/16 0723  NA 142 140  K 3.7 4.6  CL 109 112*  CO2 24 21*  GLUCOSE 91 73  BUN 17 11  CREATININE 0.64 0.63  CALCIUM 8.3* 8.2*   PT/INR No results for input(s): LABPROT, INR in the last 72 hours. CMP     Component Value Date/Time   NA 140 05/27/2016 0723   K 4.6 05/27/2016 0723   CL 112 (H) 05/27/2016 0723   CO2 21 (L) 05/27/2016 0723   GLUCOSE 73 05/27/2016 0723   BUN 11 05/27/2016 0723   CREATININE 0.63 05/27/2016 0723   CALCIUM 8.2 (L) 05/27/2016 0723   PROT 7.1 05/24/2016 0834   ALBUMIN 4.5 05/24/2016 0834   AST 24 05/24/2016 0834   ALT 21 05/24/2016 0834   ALKPHOS 71 05/24/2016 0834   BILITOT 1.1 05/24/2016 0834   GFRNONAA >60 05/27/2016 0723   GFRAA >60 05/27/2016 0723   Lipase     Component Value Date/Time   LIPASE 16 05/24/2016 0834       Studies/Results: Dg Abd Portable 1v  Result Date: 05/27/2016 CLINICAL DATA:  61 year old female with recent abdominal pain and  bloating. Initial encounter. EXAM: PORTABLE ABDOMEN - 1 VIEW COMPARISON:  05/26/2016 and earlier including CT Abdomen and Pelvis 05/24/2016 FINDINGS: Portable AP view at at 0702 hours. Enteric tube appears stable, side hole to level of the proximal stomach. Grossly negative lung bases. As seen yesterday oral contrast administered after the recent CT Abdomen and Pelvis is present throughout the large bowel arguing against small bowel obstruction. There do remain 1 or 2 mildly dilated mid abdominal small bowel loops up to 3.5-4 cm diameter. There is severe diverticulosis of the sigmoid colon which contains contrast. No acute osseous abnormality identified. IMPRESSION: 1. Stable enteric tube, side hole the level of the proximal stomach. 2. Oral contrast administered since the prior CT Abdomen and Pelvis is again noted throughout the large bowel arguing against small bowel obstruction. There remain 1 or 2 loops of mildly dilated small bowel in the mid abdomen which is nonspecific, perhaps due to mild ileus. 3. Severe diverticulosis of the distal colon. Electronically Signed   By: Genevie Ann M.D.   On: 05/27/2016 07:47   Dg Abd Portable 1v-small Bowel Obstruction Protocol-initial, 8 Hr Delay  Result Date: 05/27/2016 CLINICAL DATA:  61 year old female with small bowel obstruction 8 hour delayed image. EXAM: PORTABLE ABDOMEN - 1 VIEW COMPARISON:  Abdominal radiograph dated 05/26/2016 FINDINGS:  Oral contrast noted throughout the colon. There is continued mild dilatation of small-bowel loops in the left upper abdomen measuring up to 3 cm in diameter. An enteric tube is partially visualized in stable positioning. No free air noted. The soft tissues and osseous structures appear unremarkable. IMPRESSION: Oral contrast opacifies the colon. Mildly dilated small bowel loops in the left upper abdomen measuring up to 3 cm. Electronically Signed   By: Anner Crete M.D.   On: 05/27/2016 00:21   Dg Abd Portable 1v  Result  Date: 05/26/2016 CLINICAL DATA:  Small bowel obstruction. EXAM: PORTABLE ABDOMEN - 1 VIEW COMPARISON:  CT scan of May 24, 2016. FINDINGS: Distal tip of nasogastric tube is seen in proximal stomach. Dilated small bowel loops are noted concerning for distal small bowel obstruction. No colonic dilatation is noted. No abnormal calcifications are noted. IMPRESSION: Dilated small bowel loops are noted concerning for distal small bowel obstruction. Distal tip of nasogastric tube seen in proximal stomach. Electronically Signed   By: Marijo Conception, M.D.   On: 05/26/2016 08:27      Assessment/Plan  HLD (hyperlipidemia) COPD (chronic obstructive pulmonary disease) (HCC) GERD Radiculopathy of lumbosacral region  Small bowel obstruction -DC NGT -Start CLD and adv as tol  LOS: 3 days

## 2016-05-27 NOTE — Discharge Summary (Signed)
[name] [id number] DOB: 02/09/1956 DOA: 05/24/2016  PCP: Lilian Coma, MD  Admit date: 05/24/2016  Discharge date: 05/27/2016  Admitted From: Home  Disposition:  Home   Recommendations for Outpatient Follow-up:   Follow up with PCP in 1-2 weeks  PCP Please obtain BMP/CBC, 2 view CXR in 1week,  (see Discharge instructions)   PCP Please follow up on the following pending results: None   Home Health: None   Equipment/Devices: None  Consultations: Surgery Discharge Condition: Stable   CODE STATUS: Full   Diet Recommendation: DIET SOFT     Chief Complaint  Patient presents with  . Spasms     Brief history of present illness from the day of admission and additional interim summary    [name] a 61 y.o.femalewith past medical history of anxiety, reflux, high cholesterol and appendectomy presented to the emergency room with chief complaint of abdominal distention and pain due to SBO.  Hospital issues addressed     1.Small bowel obstruction. Likely due to adhesions from prior surgery, treated conservatively, multiple bowel movements passing flatus, Gastrografin study unremarkable. Seen by general surgery. NG tube taken out this morning tolerating diet will be discharged home with PCP follow up. We'll request the patient to follow with her primary GI physician as well after discharge.  2. GERD. on PPI  3. COPD. Stable at baseline, no wheezing. Supportive care.  4. Anxiety. As needed IV Ativan.  5. Hypokalemia. Replaced and stable.  6. Dyslipidemia. Resume statin.   Discharge diagnosis     Principal Problem:   SBO (small bowel obstruction) Active Problems:   HLD (hyperlipidemia)   COPD (chronic obstructive pulmonary disease) (HCC)   GERD   Radiculopathy of lumbosacral  region    Discharge instructions    Discharge Instructions    Discharge instructions    Complete by:  As directed    Follow with Primary MD Lilian Coma, MD in 7 days   Get CBC, CMP, 2 view Chest X ray checked  by Primary MD or SNF MD in 5-7 days ( we routinely change or add medications that can affect your baseline labs and fluid status, therefore we recommend that you get the mentioned basic workup next visit with your PCP, your PCP may decide not to get them or add new tests based on their clinical decision)   Activity: As tolerated with Full fall precautions use walker/cane & assistance as needed   Disposition Home    Diet:   Soft diet advance over the 3-4 days to heart healthy regular as tolerated.  For Heart failure patients - Check your Weight same time everyday, if you gain over 2 pounds, or you develop in leg swelling, experience more shortness of breath or chest pain, call your Primary MD immediately. Follow Cardiac Low Salt Diet and 1.5 lit/day fluid restriction.   On your next visit with your primary care physician please Get Medicines reviewed and adjusted.   Please request your Prim.MD to go over all Hospital Tests and Procedure/Radiological  results at the follow up, please get all Hospital records sent to your Prim MD by signing hospital release before you go home.   If you experience worsening of your admission symptoms, develop shortness of breath, life threatening emergency, suicidal or homicidal thoughts you must seek medical attention immediately by calling 911 or calling your MD immediately  if symptoms less severe.  You Must read complete instructions/literature along with all the possible adverse reactions/side effects for all the Medicines you take and that have been prescribed to you. Take any new Medicines after you have completely understood and accpet all the possible adverse reactions/side effects.   Do not drive, operate heavy machinery, perform  activities at heights, swimming or participation in water activities or provide baby sitting services if your were admitted for syncope or siezures until you have seen by Primary MD or a Neurologist and advised to do so again.  Do not drive when taking Pain medications.    Do not take more than prescribed Pain, Sleep and Anxiety Medications  Special Instructions: If you have smoked or chewed Tobacco  in the last 2 yrs please stop smoking, stop any regular Alcohol  and or any Recreational drug use.  Wear Seat belts while driving.   Please note  You were cared for by a hospitalist during your hospital stay. If you have any questions about your discharge medications or the care you received while you were in the hospital after you are discharged, you can call the unit and asked to speak with the hospitalist on call if the hospitalist that took care of you is not available. Once you are discharged, your primary care physician will handle any further medical issues. Please note that NO REFILLS for any discharge medications will be authorized once you are discharged, as it is imperative that you return to your primary care physician (or establish a relationship with a primary care physician if you do not have one) for your aftercare needs so that they can reassess your need for medications and monitor your lab values.   Increase activity slowly    Complete by:  As directed       Discharge Medications   Allergies as of 05/27/2016   No Known Allergies     Medication List    TAKE these medications   ALPRAZolam 0.5 MG tablet Commonly known as:  XANAX TAKE 1 TABLET BY MOUTH EVERY 6 HOURS AS NEEDED   aspirin EC 81 MG tablet Take 81 mg by mouth daily.   atorvastatin 40 MG tablet Commonly known as:  LIPITOR Take 40 mg by mouth daily at 6 PM.   CALCIUM PLUS VITAMIN D PO Take 1 capsule by mouth 2 (two) times daily.   estradiol 0.1 MG/24HR patch Commonly known as:  VIVELLE-DOT Place 1  patch onto the skin 2 (two) times a week.   mometasone 50 MCG/ACT nasal spray Commonly known as:  NASONEX Place 2 sprays into the nose daily. 0-2 puffs each nostril every morning and 1-2 puffs at bedtime   mometasone-formoterol 100-5 MCG/ACT Aero Commonly known as:  DULERA Take 2 puffs first thing in am and then another 2 puffs about 12 hours later if short or breath or coughing   montelukast 10 MG tablet Commonly known as:  SINGULAIR Take 10 mg by mouth at bedtime.   multivitamin capsule Take 1 capsule by mouth daily.   omeprazole 20 MG capsule Commonly known as:  PRILOSEC TAKE 1 CAPSULE BY MOUTH 30 - 60  MINUTES BEFORE THE FIRST BIG MEAL OF THE DAY   oxybutynin 10 MG 24 hr tablet Commonly known as:  DITROPAN-XL Take 10 mg by mouth at bedtime.   polyethylene glycol packet Commonly known as:  MIRALAX / GLYCOLAX Take 17 g by mouth daily.   valACYclovir 500 MG tablet Commonly known as:  VALTREX Take 1,000 mg by mouth daily as needed (outbreak). 2 every day as needed   vitamin C 500 MG tablet Commonly known as:  ASCORBIC ACID Take 500 mg by mouth daily.   VYTORIN 10-80 MG tablet Generic drug:  ezetimibe-simvastatin TAKE 1 TABLET BY MOUTH DAILY AT BEDTIME       Follow-up Information    Lilian Coma, MD. Schedule an appointment as soon as possible for a visit in 1 week(s).   Specialty:  Family Medicine Why:  And your gastroenterologist within a month Contact information: [address] [place] [address] [phone]           Major procedures and Radiology Reports - PLEASE review detailed and final reports thoroughly  -        Ct Abdomen Pelvis W Contrast  Result Date: 05/24/2016 CLINICAL DATA:  Abdominal bloating. EXAM: CT ABDOMEN AND PELVIS WITH CONTRAST TECHNIQUE: Multidetector CT imaging of the abdomen and pelvis was performed using the standard protocol following bolus administration of intravenous contrast. CONTRAST:  116mL  ISOVUE-300 IOPAMIDOL (ISOVUE-300) INJECTION 61% COMPARISON:  None FINDINGS: Lower chest: No acute abnormality. Hepatobiliary: No focal liver abnormality is seen. No gallstones, gallbladder wall thickening, or biliary dilatation. Pancreas: Unremarkable. No pancreatic ductal dilatation or surrounding inflammatory changes. Spleen: Normal in size without focal abnormality. Adrenals/Urinary Tract: The adrenal glands are normal. Unremarkable appearance of the right kidney. Several small hypodensities within the left kidney are too small to reliably characterize. No mass or hydronephrosis. Stomach/Bowel: Normal appearance of the stomach. The duodenal jejunal junction is in an atypical location within the midline of the abdomen. The mid small bowel loops are increased in caliber with multiple fluid levels. There is mild wall thickening involving the dilated small bowel loops within the right hemiabdomen. Distal small bowel is decreased in caliber. No pathologic dilatation of the colon. There is soft tissue stranding/edema within the small bowel mesenteric. There is no evidence for pneumatosis, portal venous gas or pneumoperitoneum. Vascular/Lymphatic: Aortic atherosclerosis. No enlarged retroperitoneal or mesenteric adenopathy. No enlarged pelvic or inguinal lymph nodes. Reproductive: Status post hysterectomy. No adnexal masses. Other: No free fluid or fluid collections. Musculoskeletal: Degenerative disc disease is identified within the lumbar spine. IMPRESSION: 1. Examination is positive for small bowel obstruction affecting the mid small bowel. There is associated mild wall thickening and mesenteric edema. No pneumatosis, portal venous gas or pneumoperitoneum. 2. 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 3. Aortic atherosclerosis. 4. Lumbar spondylosis. Electronically Signed   By: Kerby Moors M.D.   On: 05/24/2016 11:51   Dg Abd Portable 1v  Result Date:  05/27/2016 CLINICAL DATA:  61 year old female with recent abdominal pain and bloating. Initial encounter. EXAM: PORTABLE ABDOMEN - 1 VIEW COMPARISON:  05/26/2016 and earlier including CT Abdomen and Pelvis 05/24/2016 FINDINGS: Portable AP view at at 0702 hours. Enteric tube appears stable, side hole to level of the proximal stomach. Grossly negative lung bases. As seen yesterday oral contrast administered after the recent CT Abdomen and Pelvis is present throughout the large bowel arguing against small bowel obstruction. There do remain 1 or 2 mildly dilated  mid abdominal small bowel loops up to 3.5-4 cm diameter. There is severe diverticulosis of the sigmoid colon which contains contrast. No acute osseous abnormality identified. IMPRESSION: 1. Stable enteric tube, side hole the level of the proximal stomach. 2. Oral contrast administered since the prior CT Abdomen and Pelvis is again noted throughout the large bowel arguing against small bowel obstruction. There remain 1 or 2 loops of mildly dilated small bowel in the mid abdomen which is nonspecific, perhaps due to mild ileus. 3. Severe diverticulosis of the distal colon. Electronically Signed   By: Genevie Ann M.D.   On: 05/27/2016 07:47   Dg Abd Portable 1v-small Bowel Obstruction Protocol-initial, 8 Hr Delay  Result Date: 05/27/2016 CLINICAL DATA:  61 year old female with small bowel obstruction 8 hour delayed image. EXAM: PORTABLE ABDOMEN - 1 VIEW COMPARISON:  Abdominal radiograph dated 05/26/2016 FINDINGS: Oral contrast noted throughout the colon. There is continued mild dilatation of small-bowel loops in the left upper abdomen measuring up to 3 cm in diameter. An enteric tube is partially visualized in stable positioning. No free air noted. The soft tissues and osseous structures appear unremarkable. IMPRESSION: Oral contrast opacifies the colon. Mildly dilated small bowel loops in the left upper abdomen measuring up to 3 cm. Electronically Signed   By:  Anner Crete M.D.   On: 05/27/2016 00:21   Dg Abd Portable 1v  Result Date: 05/26/2016 CLINICAL DATA:  Small bowel obstruction. EXAM: PORTABLE ABDOMEN - 1 VIEW COMPARISON:  CT scan of May 24, 2016. FINDINGS: Distal tip of nasogastric tube is seen in proximal stomach. Dilated small bowel loops are noted concerning for distal small bowel obstruction. No colonic dilatation is noted. No abnormal calcifications are noted. IMPRESSION: Dilated small bowel loops are noted concerning for distal small bowel obstruction. Distal tip of nasogastric tube seen in proximal stomach. Electronically Signed   By: Marijo Conception, M.D.   On: 05/26/2016 08:27    Micro Results     No results found for this or any previous visit (from the past 240 hour(s)).  Today   Subjective    [name] today has no headache,no chest abdominal pain,no new weakness tingling or numbness, feels much better wants to go home today.     Objective   Blood pressure (!) 109/46, pulse 75, temperature 98.4 F (36.9 C), temperature source Oral, resp. rate 20, height 5\' 2"  (1.575 m), weight 64.9 kg (143 lb 1.6 oz), SpO2 99 %.   Intake/Output Summary (Last 24 hours) at 05/27/16 1036 Last data filed at 05/27/16 0900  Gross per 24 hour  Intake              900 ml  Output             1800 ml  Net             -900 ml    Exam Awake Alert, Oriented x 3, No new F.N deficits, Normal affect [place].AT,PERRAL Supple Neck,No JVD, No cervical lymphadenopathy appriciated.  Symmetrical Chest wall movement, Good air movement bilaterally, CTAB RRR,No Gallops,Rubs or new Murmurs, No Parasternal Heave +ve B.Sounds, Abd Soft, Non tender, No organomegaly appriciated, No rebound -guarding or rigidity. No Cyanosis, Clubbing or edema, No new Rash or bruise   Data Review   CBC w Diff: Lab Results  Component Value Date   WBC 5.0 05/27/2016   HGB 11.2 (L) 05/27/2016   HCT 34.9 (L) 05/27/2016   PLT 161 05/27/2016   LYMPHOPCT 19.7  07/11/2011   MONOPCT 7.6 07/11/2011   EOSPCT 2.1 07/11/2011   BASOPCT 1.2 07/11/2011    CMP: Lab Results  Component Value Date   NA 140 05/27/2016   K 4.6 05/27/2016   CL 112 (H) 05/27/2016   CO2 21 (L) 05/27/2016   BUN 11 05/27/2016   CREATININE 0.63 05/27/2016   PROT 7.1 05/24/2016   ALBUMIN 4.5 05/24/2016   BILITOT 1.1 05/24/2016   ALKPHOS 71 05/24/2016   AST 24 05/24/2016   ALT 21 05/24/2016  .   Total Time in preparing paper work, data evaluation and todays exam - 35 minutes  Thurnell Lose M.D on 05/27/2016 at 10:36 AM  [facility]   Office  [phone]

## 2016-05-27 NOTE — Progress Notes (Signed)
Paged Kinsinger,MD of [facility] [place] [facility] about patient's abdominal xray results and whether or not the NG tube can be removed and her diet can be advanced. Kinsinger,MD returned page and stated that the results would be discussed with the patient in the morning. RN informed patient. Will continue to monitor and treat per MD orders.

## 2016-05-27 NOTE — Progress Notes (Signed)
Patient discharged to home with belongings, IV and tele removed. AVS given, all questions answered, patient stable at time of discharge.

## 2016-05-27 NOTE — Discharge Instructions (Signed)
Follow with Primary MD Lilian Coma, MD in 7 days   Get CBC, CMP, 2 view Chest X ray checked  by Primary MD or SNF MD in 5-7 days ( we routinely change or add medications that can affect your baseline labs and fluid status, therefore we recommend that you get the mentioned basic workup next visit with your PCP, your PCP may decide not to get them or add new tests based on their clinical decision)   Activity: As tolerated with Full fall precautions use walker/cane & assistance as needed   Disposition Home    Diet:   Soft diet advance over the 3-4 days to heart healthy regular as tolerated.  For Heart failure patients - Check your Weight same time everyday, if you gain over 2 pounds, or you develop in leg swelling, experience more shortness of breath or chest pain, call your Primary MD immediately. Follow Cardiac Low Salt Diet and 1.5 lit/day fluid restriction.   On your next visit with your primary care physician please Get Medicines reviewed and adjusted.   Please request your Prim.MD to go over all Hospital Tests and Procedure/Radiological results at the follow up, please get all Hospital records sent to your Prim MD by signing hospital release before you go home.   If you experience worsening of your admission symptoms, develop shortness of breath, life threatening emergency, suicidal or homicidal thoughts you must seek medical attention immediately by calling 911 or calling your MD immediately  if symptoms less severe.  You Must read complete instructions/literature along with all the possible adverse reactions/side effects for all the Medicines you take and that have been prescribed to you. Take any new Medicines after you have completely understood and accpet all the possible adverse reactions/side effects.   Do not drive, operate heavy machinery, perform activities at heights, swimming or participation in water activities or provide baby sitting services if your were admitted for  syncope or siezures until you have seen by Primary MD or a Neurologist and advised to do so again.  Do not drive when taking Pain medications.    Do not take more than prescribed Pain, Sleep and Anxiety Medications  Special Instructions: If you have smoked or chewed Tobacco  in the last 2 yrs please stop smoking, stop any regular Alcohol  and or any Recreational drug use.  Wear Seat belts while driving.   Please note  You were cared for by a hospitalist during your hospital stay. If you have any questions about your discharge medications or the care you received while you were in the hospital after you are discharged, you can call the unit and asked to speak with the hospitalist on call if the hospitalist that took care of you is not available. Once you are discharged, your primary care physician will handle any further medical issues. Please note that NO REFILLS for any discharge medications will be authorized once you are discharged, as it is imperative that you return to your primary care physician (or establish a relationship with a primary care physician if you do not have one) for your aftercare needs so that they can reassess your need for medications and monitor your lab values.

## 2016-05-28 ENCOUNTER — Telehealth: Payer: Self-pay | Admitting: Internal Medicine

## 2016-05-28 NOTE — Telephone Encounter (Signed)
I spoke with the patient.  She misunderstood and thought she was being scheduled with PA to do a colonoscopy.  I assured her that if a procedure was needed that Dr. Henrene Pastor would perform.  She will come for a post hospital office visit with Nicoletta Ba PA for 06/11/16

## 2016-06-02 ENCOUNTER — Other Ambulatory Visit: Payer: Self-pay | Admitting: Family Medicine

## 2016-06-02 ENCOUNTER — Ambulatory Visit
Admission: RE | Admit: 2016-06-02 | Discharge: 2016-06-02 | Disposition: A | Discharge status: Home / Self Care | Payer: BLUE CROSS/BLUE SHIELD | Source: Ambulatory Visit | Attending: Family Medicine | Admitting: Family Medicine

## 2016-06-02 DIAGNOSIS — K56609 Unspecified intestinal obstruction, unspecified as to partial versus complete obstruction: Secondary | ICD-10-CM

## 2016-06-11 ENCOUNTER — Ambulatory Visit (INDEPENDENT_AMBULATORY_CARE_PROVIDER_SITE_OTHER): Payer: BLUE CROSS/BLUE SHIELD | Admitting: Physician Assistant

## 2016-06-11 ENCOUNTER — Encounter: Payer: Self-pay | Admitting: Physician Assistant

## 2016-06-11 VITALS — BP 118/78 | HR 84 | Ht 62.0 in | Wt 140.0 lb

## 2016-06-11 DIAGNOSIS — Z8719 Personal history of other diseases of the digestive system: Secondary | ICD-10-CM

## 2016-06-11 DIAGNOSIS — K5900 Constipation, unspecified: Secondary | ICD-10-CM | POA: Diagnosis not present

## 2016-06-11 DIAGNOSIS — Z86010 Personal history of colonic polyps: Secondary | ICD-10-CM | POA: Diagnosis not present

## 2016-06-11 DIAGNOSIS — Z1211 Encounter for screening for malignant neoplasm of colon: Secondary | ICD-10-CM

## 2016-06-11 MED ORDER — NA SULFATE-K SULFATE-MG SULF 17.5-3.13-1.6 GM/177ML PO SOLN: 1.0000 | Freq: Once | ORAL | 0 refills | Status: AC

## 2016-06-11 NOTE — Patient Instructions (Addendum)
Continue Miralax 17 grams in 8 oz of water daily. Gradually advance to a regular diet.   You have been scheduled for a colonoscopy. Please follow written instructions given to you at your visit today.  Please pick up your prep supplies at the pharmacy within the next 1-3 days. [facility].  If you use inhalers (even only as needed), please bring them with you on the day of your procedure. Your physician has requested that you go to www.startemmi.com and enter the access code given to you at your visit today. This web site gives a general overview about your procedure. However, you should still follow specific instructions given to you by our office regarding your preparation for the procedure.

## 2016-06-11 NOTE — Progress Notes (Signed)
Subjective:    Patient ID: [name], female    DOB: 20-Aug-1955, 61 y.o.   MRN: [id number]  HPI [name] is a pleasant 61 year old female known to Dr. Scarlette Shorts. She was last seen here in October 2013 when she underwent colonoscopy. She has history of multiple adenomatous polyps. At the time of colonoscopy in 2013 she was noted to have severe diverticulosis of the left colon but no recurrent polyps. She was recommended to have 5 year interval follow-up. Other problems include GERD, COPD, dosed appendectomy, vaginal hysterectomy, rectocele and cystocele repair. She comes in today for a post hospital follow-up. She had admission 123 05/27/2016 acute small bowel obstruction. She was seen by surgery during her admission but not GI. She says she developed acute abdominal bloating and distention gradually followed by nausea and vomiting. She was up all night with nausea and vomiting and then proceeded to the emergency room on January 20. CT scan of the abdomen and pelvis showed no evidence of gallstones, she is noted to have an atypical location to the duodenal jejunal junction within the midline of the abdomen, mid small bowel loops were increased in caliber with multiple fluid levels was also mild wall thickening involving the dilated small bowel loops within the right hemiabdomen. No dilation of the distal small bowel or colon also some soft tissue stranding/edema within the small bowel mesentery no pneumatosis or portal venous gas. Patient says she improved fairly quickly after NG decompression and was able to eat when she left the hospital 3 days later and she says she continued to have some right-sided abdominal discomfort. She was seen by her primary care physician placed on a low roughage diet and twice daily MiraLAX and has continued to see improvement over the past. She denies any pain at this point and bowels are moving regularly. She is able to eat without difficulty and would like to advance her  diet denies any further episodes of bloating.   Labs were reviewed from hospitalization she did have a mild leukocytosis, hemoglobin 11.2. KUB done on 06/02/2016 oh mild dilation of the small bowel in the left mid abdomen was significantly less dilated than prior exam, moderate stool   Review of Systems Pertinent positive and negative review of systems were noted in the above HPI section.  All other review of systems was otherwise negative.  Outpatient Encounter Prescriptions as of 06/11/2016  Medication Sig  . ALPRAZolam (XANAX) 0.5 MG tablet TAKE 1 TABLET BY MOUTH EVERY 6 HOURS AS NEEDED  . Ascorbic Acid (VITAMIN C) 500 MG tablet Take 500 mg by mouth daily.    Marland Kitchen aspirin EC 81 MG tablet Take 81 mg by mouth daily.  Marland Kitchen atorvastatin (LIPITOR) 40 MG tablet Take 40 mg by mouth daily at 6 PM.  . Calcium Carbonate-Vitamin D (CALCIUM PLUS VITAMIN D PO) Take 1 capsule by mouth 2 (two) times daily.    Marland Kitchen estradiol (VIVELLE-DOT) 0.1 MG/24HR Place 1 patch onto the skin 2 (two) times a week.    . mometasone (NASONEX) 50 MCG/ACT nasal spray Place 2 sprays into the nose daily. 0-2 puffs each nostril every morning and 1-2 puffs at bedtime   . montelukast (SINGULAIR) 10 MG tablet Take 10 mg by mouth at bedtime.    . Multiple Vitamin (MULTIVITAMIN) capsule Take 1 capsule by mouth daily.    Marland Kitchen omeprazole (PRILOSEC) 20 MG capsule TAKE 1 CAPSULE BY MOUTH 30 - 60 MINUTES BEFORE THE FIRST BIG MEAL OF THE DAY  .  oxybutynin (DITROPAN-XL) 10 MG 24 hr tablet Take 10 mg by mouth at bedtime.  . polyethylene glycol (MIRALAX / GLYCOLAX) packet Take 17 g by mouth daily.   . valACYclovir (VALTREX) 500 MG tablet Take 1,000 mg by mouth daily as needed (outbreak). 2 every day as needed   . VYTORIN 10-80 MG per tablet TAKE 1 TABLET BY MOUTH DAILY AT BEDTIME  . Na Sulfate-K Sulfate-Mg Sulf 17.5-3.13-1.6 GM/180ML SOLN Take 1 kit by mouth once.  . [DISCONTINUED] Mometasone Furo-Formoterol Fum (DULERA) 100-5 MCG/ACT AERO Take 2 puffs  first thing in am and then another 2 puffs about 12 hours later if short or breath or coughing   (Patient not taking: Reported on 05/24/2016)   No facility-administered encounter medications on file as of 06/11/2016.    No Known Allergies Patient Active Problem List   Diagnosis Date Noted  . SBO (small bowel obstruction) 05/24/2016  . GERD (gastroesophageal reflux disease) 11/13/2011  . Osteopenia 10/07/2010  . COPD (chronic obstructive pulmonary disease) ([place]) 10/02/2009  . SLEEP DISORDER UNSPECIFIED 01/16/2009  . Radiculopathy of lumbosacral region 09/21/2007  . POLYP, COLON 03/04/2007  . HLD (hyperlipidemia) 03/04/2007  . RHINITIS, CHRONIC 03/04/2007  . GERD 03/04/2007   Social History   Social History  . Marital status: Married    Spouse name: N/A  . Number of children: N/A  . Years of education: N/A   Occupational History  . Not on file.   Social History Main Topics  . Smoking status: Former Smoker    Packs/day: 1.50    Years: 35.00    Types: Cigarettes    Quit date: 01/03/2010  . Smokeless tobacco: Never Used  . Alcohol use No     Comment: occ  . Drug use: No  . Sexual activity: Not on file   Other Topics Concern  . Not on file   Social History Narrative  . No narrative on file    Ms. [name]'s family history includes Coronary artery disease in her father; Emphysema in her mother; Osteoporosis in her mother.      Objective:    Vitals:   06/11/16 0933  BP: 118/78  Pulse: 84    Physical Exam  well-developed older white female in no acute distress, blood pressure 118/78 pulse 84, height 5 foot 2, weight 140, BMI 25. HEENT ;nontraumatic normocephalic EOMI PERRLA sclera anicteric, Cardiovascular ;regular rate and rhythm with S1-S2 no murmur or gallop, Pulmonary clear bilaterally, Abdomen; soft, she is basically nontender there is no palpable mass or hepatosplenomegaly bowel sounds are present she has an appendectomy scar, Rectal ;exam not done, Extremities; no  clubbing cyanosis or edema skin warm and dry, Neuropsych; mood and affect appropriate       Assessment & Plan:   #31 61 year old white female status post recent small bowel obstruction resolved with conservative management. 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 Patient has not had any prior episodes of bowel obstruction. Symptoms have resolved. #2 status post prior  appendectomy, vaginal hysterectomy, rectocele and cystocele repair #3 COPD #4 history of multiple adenomatous colon polyps due for follow-up colonoscopy 2018  Plan; patient will gradually advance her diet to regular. Reduce MiraLAX to 17 g in 8 ounces of water once daily. She is advised to call for advice should she have any recurrence of similar obstructive type symptoms. She is due for colonoscopy and will be scheduled for colonoscopy with Dr. Henrene Pastor. Procedure  discussed in detail with patient including risks and benefits and she is agreeable to proceed.  Amy S Esterwood PA-C 06/11/2016   Cc: Jonathon Jordan, MD

## 2016-06-11 NOTE — Progress Notes (Signed)
Agree with assessment and plans as outlined 

## 2016-06-30 ENCOUNTER — Ambulatory Visit
Admission: RE | Admit: 2016-06-30 | Discharge: 2016-06-30 | Disposition: A | Discharge status: Home / Self Care | Payer: BLUE CROSS/BLUE SHIELD | Source: Ambulatory Visit | Attending: Family Medicine | Admitting: Family Medicine

## 2016-06-30 ENCOUNTER — Other Ambulatory Visit: Payer: Self-pay | Admitting: Family Medicine

## 2016-06-30 ENCOUNTER — Telehealth: Payer: Self-pay | Admitting: Internal Medicine

## 2016-06-30 DIAGNOSIS — M542 Cervicalgia: Secondary | ICD-10-CM

## 2016-06-30 DIAGNOSIS — J45909 Unspecified asthma, uncomplicated: Secondary | ICD-10-CM

## 2016-07-04 NOTE — Telephone Encounter (Signed)
Spoke with patient and told her I will call her pharmacy to get them to use the $50 voucher and then call her back.  Patient agreed.

## 2016-07-11 ENCOUNTER — Encounter: Payer: BLUE CROSS/BLUE SHIELD | Admitting: Internal Medicine

## 2016-08-04 ENCOUNTER — Ambulatory Visit
Admission: RE | Admit: 2016-08-04 | Discharge: 2016-08-04 | Disposition: A | Discharge status: Home / Self Care | Payer: BLUE CROSS/BLUE SHIELD | Source: Ambulatory Visit | Attending: Family Medicine | Admitting: Family Medicine

## 2016-08-04 ENCOUNTER — Other Ambulatory Visit: Payer: Self-pay | Admitting: Family Medicine

## 2016-08-04 DIAGNOSIS — J1100 Influenza due to unidentified influenza virus with unspecified type of pneumonia: Secondary | ICD-10-CM

## 2016-08-07 ENCOUNTER — Encounter: Payer: BLUE CROSS/BLUE SHIELD | Admitting: Gastroenterology

## 2016-08-18 ENCOUNTER — Encounter: Payer: Self-pay | Admitting: Internal Medicine

## 2016-09-01 ENCOUNTER — Encounter: Payer: Self-pay | Admitting: Internal Medicine

## 2016-09-01 ENCOUNTER — Ambulatory Visit (AMBULATORY_SURGERY_CENTER): Payer: BLUE CROSS/BLUE SHIELD | Admitting: Internal Medicine

## 2016-09-01 VITALS — BP 120/63 | HR 58 | Temp 97.5°F | Resp 12 | Ht 62.0 in | Wt 135.0 lb

## 2016-09-01 DIAGNOSIS — D125 Benign neoplasm of sigmoid colon: Secondary | ICD-10-CM | POA: Diagnosis not present

## 2016-09-01 DIAGNOSIS — D123 Benign neoplasm of transverse colon: Secondary | ICD-10-CM

## 2016-09-01 DIAGNOSIS — Z86010 Personal history of colonic polyps: Secondary | ICD-10-CM

## 2016-09-01 DIAGNOSIS — D122 Benign neoplasm of ascending colon: Secondary | ICD-10-CM

## 2016-09-01 DIAGNOSIS — K635 Polyp of colon: Secondary | ICD-10-CM

## 2016-09-01 MED ORDER — SODIUM CHLORIDE 0.9 % IV SOLN: 500.0000 mL | INTRAVENOUS | Status: DC

## 2016-09-01 NOTE — Progress Notes (Signed)
Called to room to assist during endoscopic procedure.  Patient ID and intended procedure confirmed with present staff. Received instructions for my participation in the procedure from the performing physician.  

## 2016-09-01 NOTE — Op Note (Signed)
[place] Patient Name: [name] Procedure Date: 09/01/2016 10:37 AM MRN: [id number] Endoscopist: Docia Chuck. Henrene Pastor , MD Age: 61 Referring MD:  Date of Birth: 08-23-1955 Gender: Female Account #: [id number] Procedure:                Colonoscopy, with cold snare polypectomy X6 Indications:              High risk colon cancer surveillance: Personal                            history of multiple (3 or more) adenomas. Previous                            examinations 2005 (TA), 2010 (5 tubular adenomas),                            2013 (negative) Medicines:                Monitored Anesthesia Care Procedure:                Pre-Anesthesia Assessment:                           - Prior to the procedure, a History and Physical                            was performed, and patient medications and                            allergies were reviewed. The patient's tolerance of                            previous anesthesia was also reviewed. The risks                            and benefits of the procedure and the sedation                            options and risks were discussed with the patient.                            All questions were answered, and informed consent                            was obtained. Prior Anticoagulants: The patient has                            taken no previous anticoagulant or antiplatelet                            agents. ASA Grade Assessment: II - A patient with                            mild systemic disease. After reviewing the risks  and benefits, the patient was deemed in                            satisfactory condition to undergo the procedure.                           After obtaining informed consent, the colonoscope                            was passed under direct vision. Throughout the                            procedure, the patient's blood pressure, pulse, and                            oxygen  saturations were monitored continuously. The                            Colonoscope was introduced through the anus and                            advanced to the the cecum, identified by                            appendiceal orifice and ileocecal valve. The                            ileocecal valve, appendiceal orifice, and rectum                            were photographed. The quality of the bowel                            preparation was excellent. The colonoscopy was                            performed without difficulty. The patient tolerated                            the procedure well. The bowel preparation used was                            SUPREP. Scope In: 10:49:17 AM Scope Out: 88:50:27 AM Scope Withdrawal Time: 0 hours 13 minutes 22 seconds  Total Procedure Duration: 0 hours 20 minutes 0 seconds  Findings:                 Six polyps were found in the sigmoid colon,                            transverse colon and ascending colon. The polyps                            were 3 to 5 mm in size. These polyps were removed  with a cold snare. Resection and retrieval were                            complete.                           Multiple diverticula were found in the entire colon.                           Internal hemorrhoids were found during retroflexion.                           The exam was otherwise without abnormality on                            direct and retroflexion views. Complications:            No immediate complications. Estimated blood loss:                            None. Estimated Blood Loss:     Estimated blood loss: none. Impression:               - Six 3 to 5 mm polyps in the sigmoid colon, in the                            transverse colon and in the ascending colon,                            removed with a cold snare. Resected and retrieved.                           - Diverticulosis in the entire examined colon.                            - Internal hemorrhoids.                           - The examination was otherwise normal on direct                            and retroflexion views. Recommendation:           - Repeat colonoscopy in 3 years for surveillance.                           - Patient has a contact number available for                            emergencies. The signs and symptoms of potential                            delayed complications were discussed with the                            patient. Return to normal activities tomorrow.  Written discharge instructions were provided to the                            patient.                           - Resume previous diet.                           - Continue present medications.                           - Await pathology results. Docia Chuck. Henrene Pastor, MD 09/01/2016 11:14:48 AM This report has been signed electronically.

## 2016-09-01 NOTE — Patient Instructions (Signed)
YOU HAD AN ENDOSCOPIC PROCEDURE TODAY AT THE [facility]:   Refer to the procedure report that was given to you for any specific questions about what was found during the examination.  If the procedure report does not answer your questions, please call your gastroenterologist to clarify.  If you requested that your care partner not be given the details of your procedure findings, then the procedure report has been included in a sealed envelope for you to review at your convenience later.  YOU SHOULD EXPECT: Some feelings of bloating in the abdomen. Passage of more gas than usual.  Walking can help get rid of the air that was put into your GI tract during the procedure and reduce the bloating. If you had a lower endoscopy (such as a colonoscopy or flexible sigmoidoscopy) you may notice spotting of blood in your stool or on the toilet paper. If you underwent a bowel prep for your procedure, you may not have a normal bowel movement for a few days.  Please Note:  You might notice some irritation and congestion in your nose or some drainage.  This is from the oxygen used during your procedure.  There is no need for concern and it should clear up in a day or so.  SYMPTOMS TO REPORT IMMEDIATELY:   Following lower endoscopy (colonoscopy or flexible sigmoidoscopy):  Excessive amounts of blood in the stool  Significant tenderness or worsening of abdominal pains  Swelling of the abdomen that is new, acute  Fever of 100F or higher  For urgent or emergent issues, a gastroenterologist can be reached at any hour by calling [phone].   DIET:  We do recommend a small meal at first, but then you may proceed to your regular diet.  Drink plenty of fluids but you should avoid alcoholic beverages for 24 hours.  MEDICATIONS: Continue present medications.  Please see handouts given to you by your recovery nurse.  ACTIVITY:  You should plan to take it easy for the rest of today and you should NOT  DRIVE or use heavy machinery until tomorrow (because of the sedation medicines used during the test).    FOLLOW UP: Our staff will call the number listed on your records the next business day following your procedure to check on you and address any questions or concerns that you may have regarding the information given to you following your procedure. If we do not reach you, we will leave a message.  However, if you are feeling well and you are not experiencing any problems, there is no need to return our call.  We will assume that you have returned to your regular daily activities without incident.  If any biopsies were taken you will be contacted by phone or by letter within the next 1-3 weeks.  Please call us at [phone] if you have not heard about the biopsies in 3 weeks.   Thank you for allowing us to provide for your healthcare needs today.   SIGNATURES/CONFIDENTIALITY: You and/or your care partner have signed paperwork which will be entered into your electronic medical record.  These signatures attest to the fact that that the information above on your After Visit Summary has been reviewed and is understood.  Full responsibility of the confidentiality of this discharge information lies with you and/or your care-partner. 

## 2016-09-01 NOTE — Progress Notes (Signed)
Report to PACU, RN, vss, BBS= Clear.  

## 2016-09-01 NOTE — Progress Notes (Signed)
Pt's states no medical or surgical changes since previsit or office visit. 

## 2016-09-02 ENCOUNTER — Telehealth: Payer: Self-pay | Admitting: *Deleted

## 2016-09-02 NOTE — Telephone Encounter (Signed)
  Follow up Call-  Call back number 09/01/2016  Post procedure Call Back phone  # [phone]  Permission to leave phone message Yes  Some recent data might be hidden     Patient questions:  Do you have a fever, pain , or abdominal swelling? No. Pain Score  0 *  Have you tolerated food without any problems? Yes.    Have you been able to return to your normal activities? Yes.    Do you have any questions about your discharge instructions: Diet   No. Medications  No. Follow up visit  No.  Do you have questions or concerns about your Care? No.  Actions: * If pain score is 4 or above: No action needed, pain <4.

## 2016-09-05 ENCOUNTER — Encounter: Payer: Self-pay | Admitting: Internal Medicine

## 2018-05-10 DIAGNOSIS — J309 Allergic rhinitis, unspecified: Secondary | ICD-10-CM | POA: Diagnosis not present

## 2018-05-10 DIAGNOSIS — J988 Other specified respiratory disorders: Secondary | ICD-10-CM | POA: Diagnosis not present

## 2018-05-25 DIAGNOSIS — C44319 Basal cell carcinoma of skin of other parts of face: Secondary | ICD-10-CM | POA: Diagnosis not present

## 2018-05-25 DIAGNOSIS — C44722 Squamous cell carcinoma of skin of right lower limb, including hip: Secondary | ICD-10-CM | POA: Diagnosis not present

## 2018-05-25 DIAGNOSIS — L905 Scar conditions and fibrosis of skin: Secondary | ICD-10-CM | POA: Diagnosis not present

## 2018-05-25 DIAGNOSIS — C4441 Basal cell carcinoma of skin of scalp and neck: Secondary | ICD-10-CM | POA: Diagnosis not present

## 2018-06-21 DIAGNOSIS — Z0120 Encounter for dental examination and cleaning without abnormal findings: Secondary | ICD-10-CM | POA: Diagnosis not present

## 2018-10-05 DIAGNOSIS — Z1231 Encounter for screening mammogram for malignant neoplasm of breast: Secondary | ICD-10-CM | POA: Diagnosis not present

## 2018-10-05 DIAGNOSIS — Z6828 Body mass index (BMI) 28.0-28.9, adult: Secondary | ICD-10-CM | POA: Diagnosis not present

## 2018-10-05 DIAGNOSIS — Z01419 Encounter for gynecological examination (general) (routine) without abnormal findings: Secondary | ICD-10-CM | POA: Diagnosis not present

## 2018-10-25 IMAGING — CT CT ABD-PELV W/ CM
2 of 5 series · 15 of 46 positions shown, 17 images · IV contrast (iopamidol)
Comparison: None

CLINICAL DATA: Abdominal bloating.

EXAM:
CT ABDOMEN AND PELVIS WITH CONTRAST
TECHNIQUE: Multidetector CT imaging of the abdomen and pelvis was performed
using the standard protocol following bolus administration of
intravenous contrast.
CONTRAST:  100mL 3DWE0C-F22 IOPAMIDOL (3DWE0C-F22) INJECTION 61%

[Series 2: a/p w/ 5mm · axial · 0.57mm/px · z∈[+849,+1239]mm · 12 of 88 slices shown, 14 images]
[im 5/88  soft-tissue]
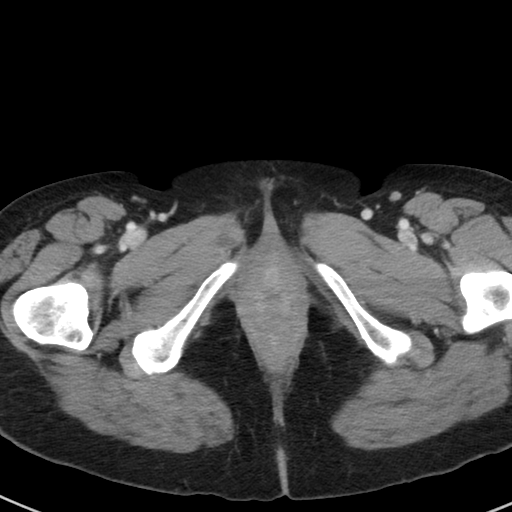
[im 5/88  bone]
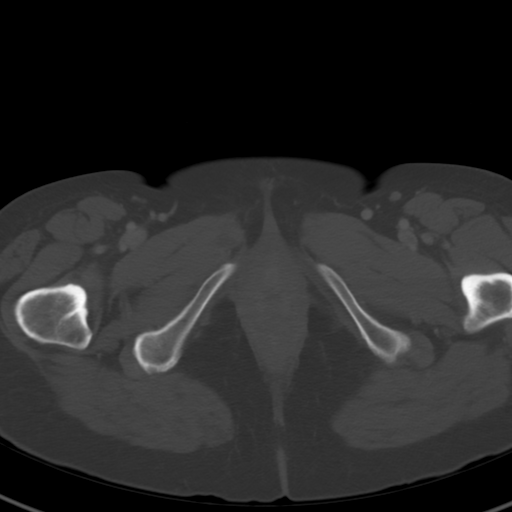
[im 14/88  soft-tissue]
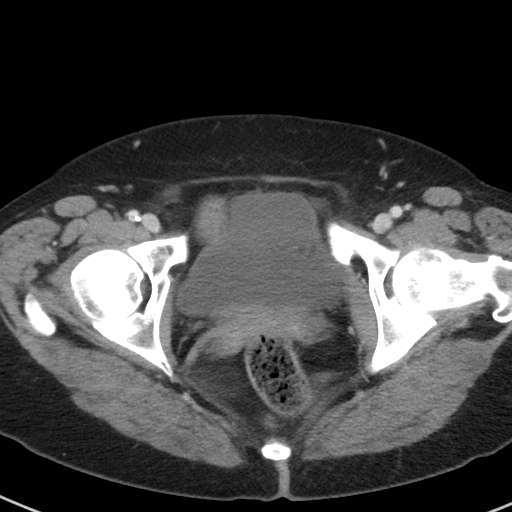
[im 18/88  soft-tissue]
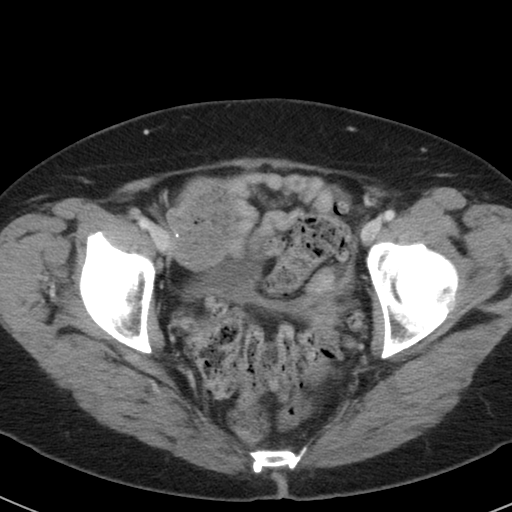
[im 27/88  soft-tissue]
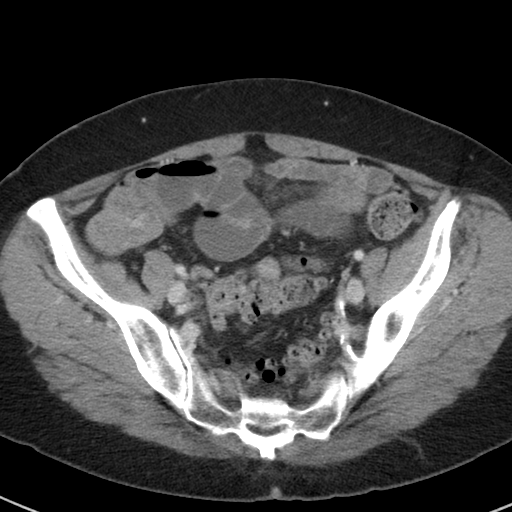
[im 35/88  soft-tissue]
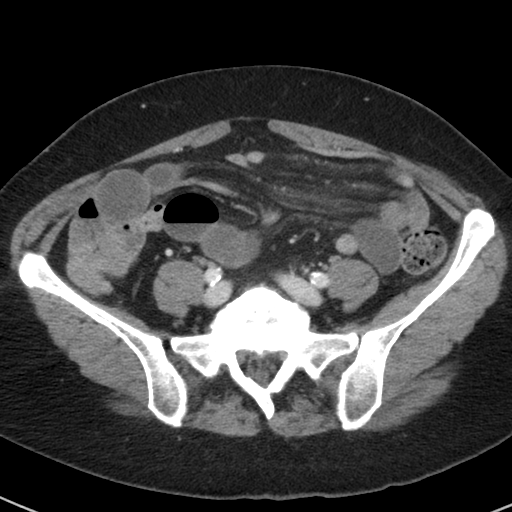
[im 40/88  soft-tissue]
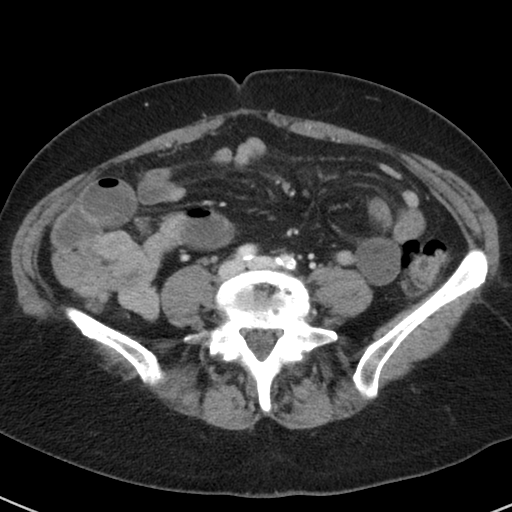
[im 48/88  soft-tissue]
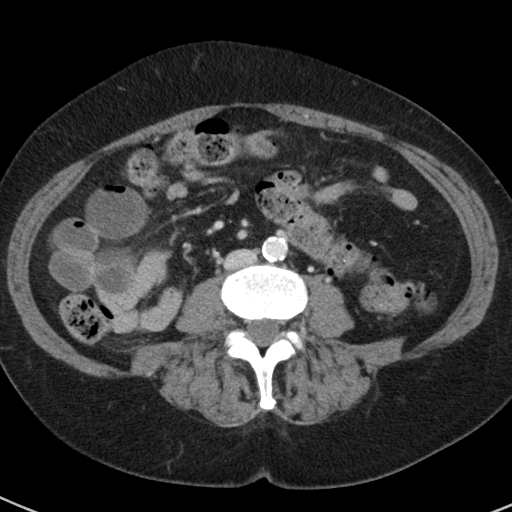
[im 53/88  soft-tissue]
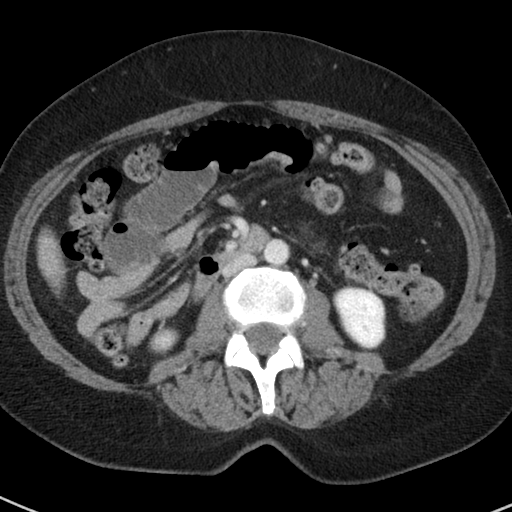
[im 61/88  soft-tissue]
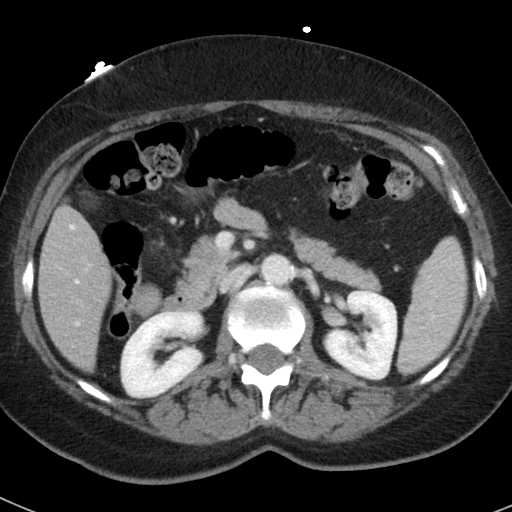
[im 61/88  bone]
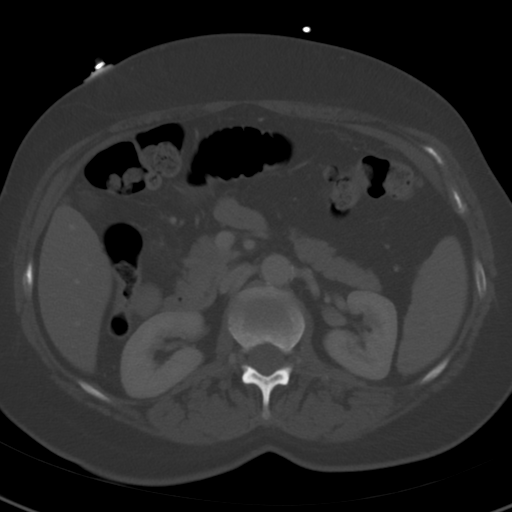
[im 70/88  soft-tissue]
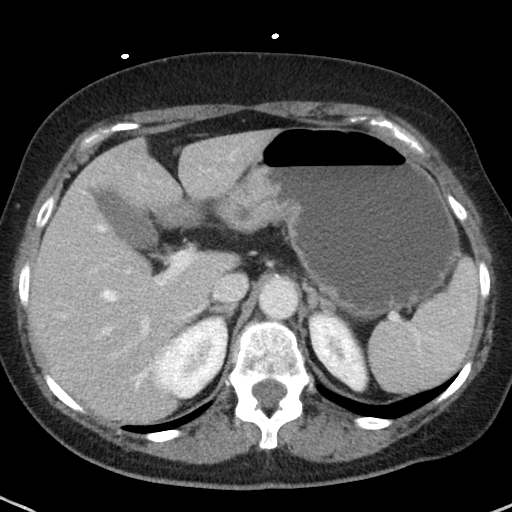
[im 74/88  soft-tissue]
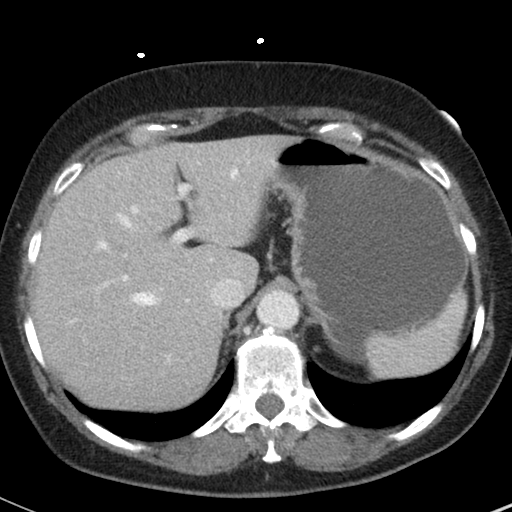
[im 83/88  soft-tissue]
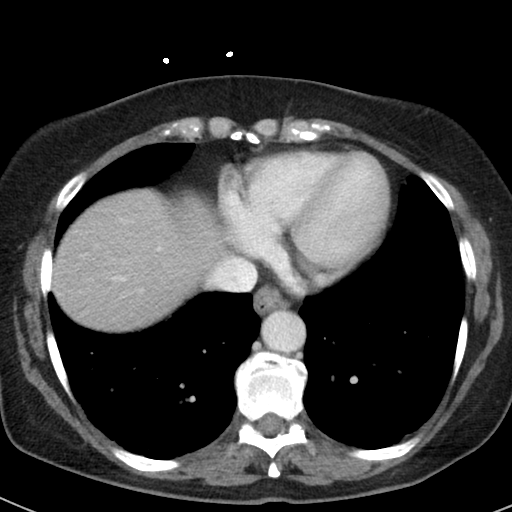

[Series 5: a/p w/ cor · coronal · 0.60mm/px · 3 of 151 slices shown]
[im 51/151  soft-tissue]
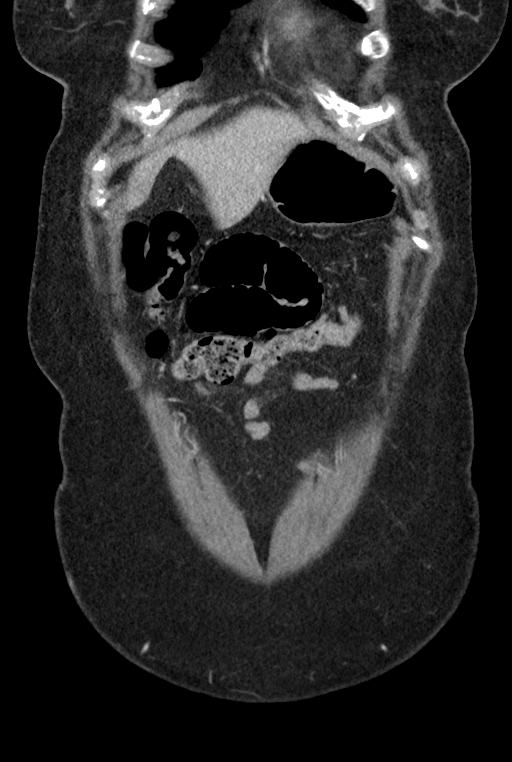
[im 67/151  soft-tissue]
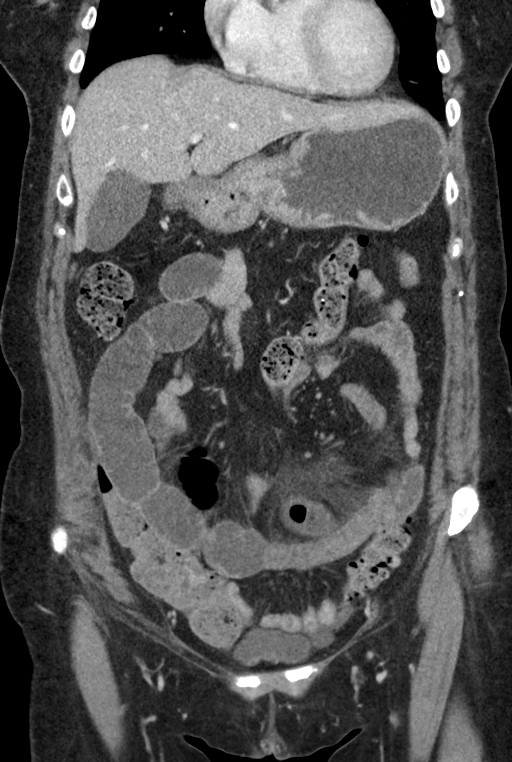
[im 84/151  soft-tissue]
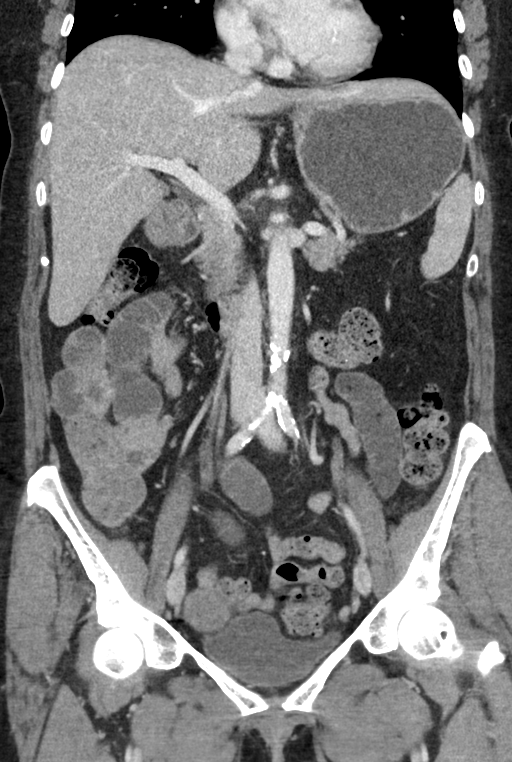

[15 of 46 positions shown; findings below may reference images not displayed]

FINDINGS: Lower chest: No acute abnormality.

Hepatobiliary: No focal liver abnormality is seen. No gallstones,
gallbladder wall thickening, or biliary dilatation.

Pancreas: Unremarkable. No pancreatic ductal dilatation or
surrounding inflammatory changes.

Spleen: Normal in size without focal abnormality.

Adrenals/Urinary Tract: The adrenal glands are normal. Unremarkable
appearance of the right kidney. Several small hypodensities within
the left kidney are too small to reliably characterize. No mass or
hydronephrosis.

Stomach/Bowel: Normal appearance of the stomach. The duodenal
jejunal junction is in an atypical location within the midline of
the abdomen. The mid small bowel loops are increased in caliber with
multiple fluid levels. There is mild wall thickening involving the
dilated small bowel loops within the right hemiabdomen. Distal small
bowel is decreased in caliber. No pathologic dilatation of the
colon. There is soft tissue stranding/edema within the small bowel
mesenteric. There is no evidence for pneumatosis, portal venous gas
or pneumoperitoneum.

Vascular/Lymphatic: Aortic atherosclerosis. No enlarged
retroperitoneal or mesenteric adenopathy. No enlarged pelvic or
inguinal lymph nodes.

Reproductive: Status post hysterectomy. No adnexal masses.

Other: No free fluid or fluid collections.

Musculoskeletal: Degenerative disc disease is identified within the
lumbar spine.
IMPRESSION: 1. Examination is positive for small bowel obstruction affecting the
mid small bowel. There is associated mild wall thickening and
mesenteric edema. No pneumatosis, portal venous gas or
pneumoperitoneum.
2. Evidence of congenital mile rotation deformity with duodenal
jejunal junction along the midline.
3. Aortic atherosclerosis.
4. Lumbar spondylosis.

## 2018-10-27 IMAGING — CR DG ABD PORTABLE 1V
1 series · 1 of 1 positions shown · non-contrast
Comparison: Abdominal radiograph dated 05/26/2016

CLINICAL DATA: 60-year-old female with small bowel obstruction 8
hour delayed image.

EXAM:
PORTABLE ABDOMEN - 1 VIEW

[AP]
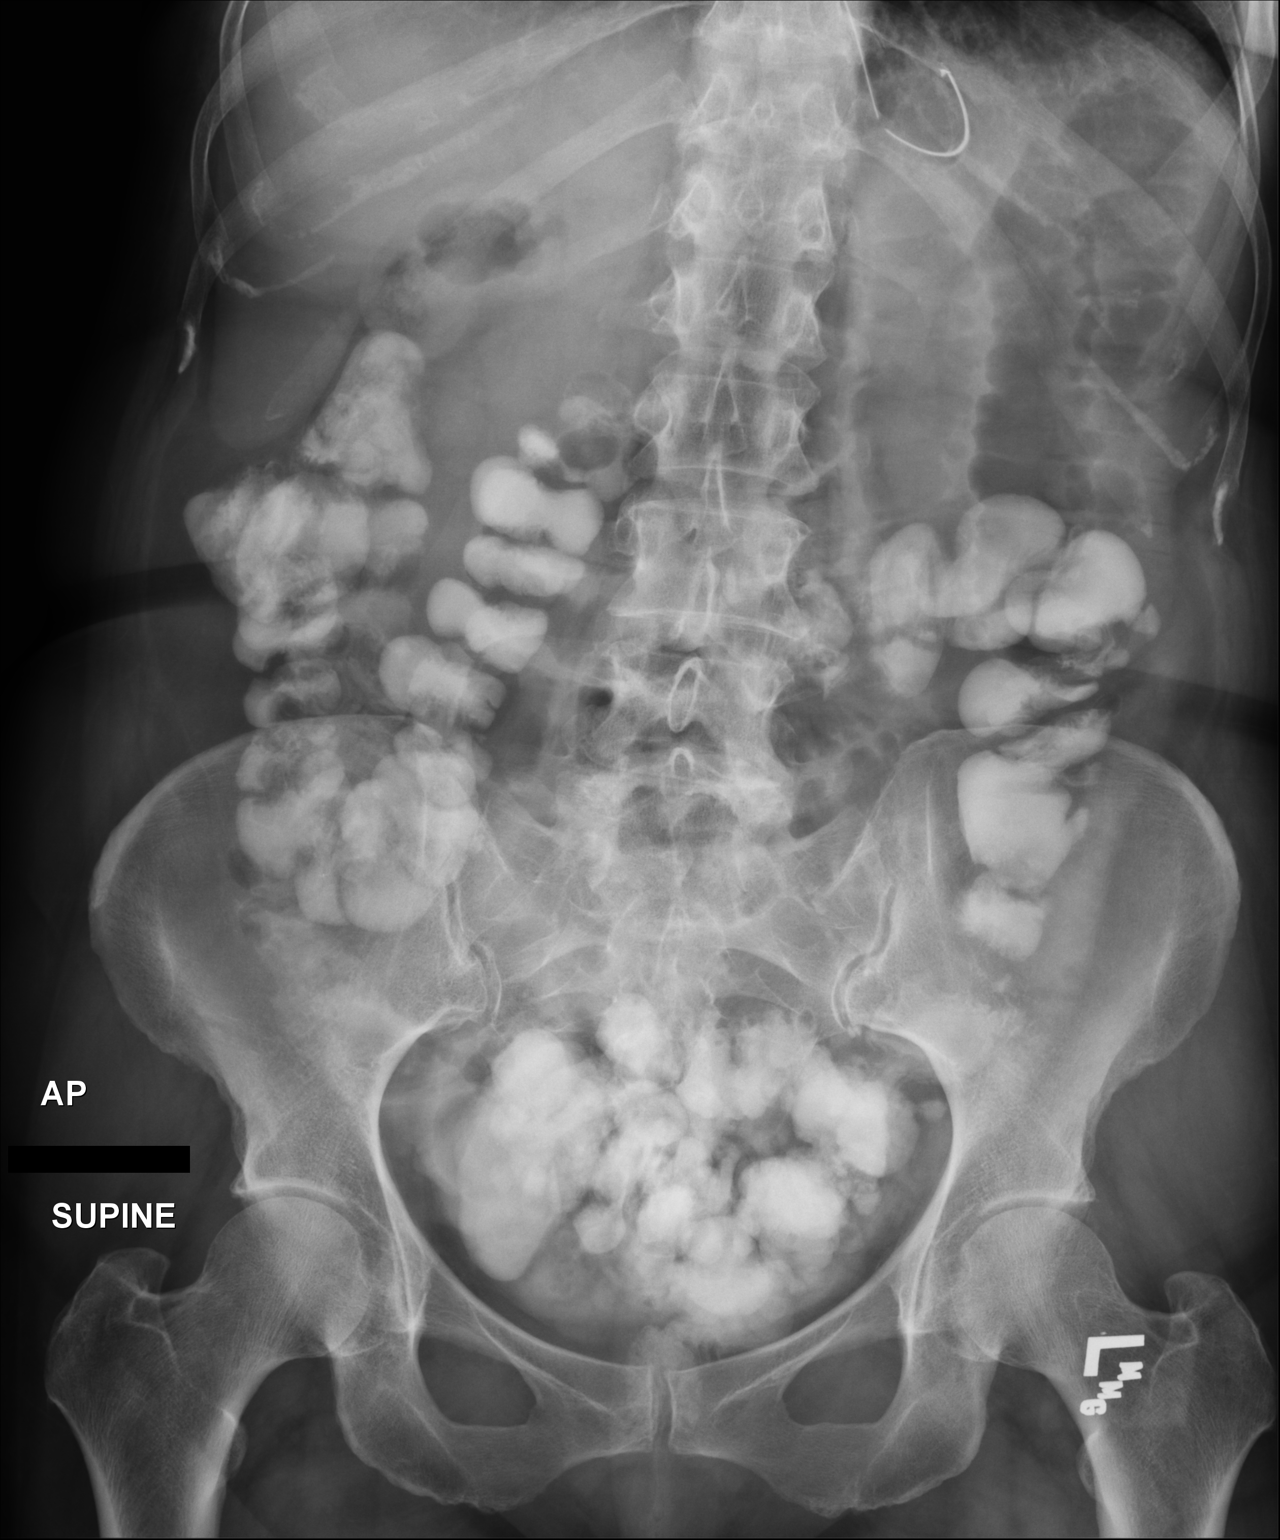

[1 of 1 positions shown; findings below may reference images not displayed]

FINDINGS: Oral contrast noted throughout the colon. There is continued mild
dilatation of small-bowel loops in the left upper abdomen measuring
up to 3 cm in diameter. An enteric tube is partially visualized in
stable positioning. No free air noted. The soft tissues and osseous
structures appear unremarkable.
IMPRESSION: Oral contrast opacifies the colon.

Mildly dilated small bowel loops in the left upper abdomen measuring
up to 3 cm.

## 2019-01-11 DIAGNOSIS — Z0120 Encounter for dental examination and cleaning without abnormal findings: Secondary | ICD-10-CM | POA: Diagnosis not present

## 2019-01-25 DIAGNOSIS — C44319 Basal cell carcinoma of skin of other parts of face: Secondary | ICD-10-CM | POA: Diagnosis not present

## 2019-02-24 DIAGNOSIS — Z0000 Encounter for general adult medical examination without abnormal findings: Secondary | ICD-10-CM | POA: Diagnosis not present

## 2019-02-24 DIAGNOSIS — E559 Vitamin D deficiency, unspecified: Secondary | ICD-10-CM | POA: Diagnosis not present

## 2019-02-24 DIAGNOSIS — Z79899 Other long term (current) drug therapy: Secondary | ICD-10-CM | POA: Diagnosis not present

## 2019-02-24 DIAGNOSIS — E785 Hyperlipidemia, unspecified: Secondary | ICD-10-CM | POA: Diagnosis not present

## 2019-03-02 ENCOUNTER — Ambulatory Visit: Payer: BLUE CROSS/BLUE SHIELD | Admitting: Family Medicine

## 2019-03-10 DIAGNOSIS — Z23 Encounter for immunization: Secondary | ICD-10-CM | POA: Diagnosis not present

## 2019-03-10 DIAGNOSIS — C44619 Basal cell carcinoma of skin of left upper limb, including shoulder: Secondary | ICD-10-CM | POA: Diagnosis not present

## 2019-03-28 ENCOUNTER — Other Ambulatory Visit: Payer: Self-pay

## 2019-03-28 DIAGNOSIS — Z20822 Contact with and (suspected) exposure to covid-19: Secondary | ICD-10-CM

## 2019-03-29 DIAGNOSIS — J069 Acute upper respiratory infection, unspecified: Secondary | ICD-10-CM | POA: Diagnosis not present

## 2019-03-30 LAB — NOVEL CORONAVIRUS, NAA: SARS-CoV-2, NAA: NOT DETECTED

## 2019-04-11 ENCOUNTER — Other Ambulatory Visit: Payer: Self-pay

## 2019-04-11 ENCOUNTER — Other Ambulatory Visit: Payer: Self-pay | Admitting: Podiatry

## 2019-04-11 ENCOUNTER — Ambulatory Visit (INDEPENDENT_AMBULATORY_CARE_PROVIDER_SITE_OTHER): Payer: Medicare Other

## 2019-04-11 ENCOUNTER — Ambulatory Visit: Payer: Medicare Other | Admitting: Podiatry

## 2019-04-11 DIAGNOSIS — M722 Plantar fascial fibromatosis: Secondary | ICD-10-CM | POA: Diagnosis not present

## 2019-04-11 DIAGNOSIS — M79671 Pain in right foot: Secondary | ICD-10-CM

## 2019-04-11 MED ORDER — MELOXICAM 15 MG PO TABS: 15.0000 mg | ORAL_TABLET | Freq: Every day | ORAL | 1 refills | Status: DC

## 2019-04-11 NOTE — Patient Instructions (Signed)
Powerstep Insoles on [employer]

## 2019-04-13 NOTE — Progress Notes (Signed)
   Subjective: 63 y.o. female presenting today as a new patient with a chief complaint of right plantar heel pain that began about one month ago. She reports associated swelling and states the pain radiates to the midfoot. Being active on the foot increases the pain. She has not had any treatment for her symptoms. She denies any known trauma or injury. Patient is here for further evaluation and treatment.   Past Medical History:  Diagnosis Date  . Anxiety   . Arthritis   . Bronchitis   . Cancer ([place])    basal cell  . Colon polyp   . COPD (chronic obstructive pulmonary disease) ([place])   . GERD (gastroesophageal reflux disease)   . Hemoptysis   . Hyperlipidemia   . Rhinitis      Objective: Physical Exam General: The patient is alert and oriented x3 in no acute distress.  Dermatology: Skin is warm, dry and supple bilateral lower extremities. Negative for open lesions or macerations bilateral.   Vascular: Dorsalis Pedis and Posterior Tibial pulses palpable bilateral.  Capillary fill time is immediate to all digits.  Neurological: Epicritic and protective threshold intact bilateral.   Musculoskeletal: Tenderness to palpation to the plantar aspect of the right heel along the plantar fascia. All other joints range of motion within normal limits bilateral. Strength 5/5 in all groups bilateral.   Radiographic exam: Normal osseous mineralization. Joint spaces preserved. No fracture/dislocation/boney destruction. No other soft tissue abnormalities or radiopaque foreign bodies.   Assessment: 1. Plantar fasciitis right  Plan of Care:  1. Patient evaluated. Xrays reviewed.   2. Injection of 0.5cc Celestone soluspan injected into the right plantar fascia  3. Declined Medrol Dose Pak. Patient has irritable reaction.  4. Rx for Meloxicam ordered for patient. 5. Plantar fascial band(s) dispensed 6. Instructed patient regarding therapies and modalities at home to alleviate symptoms.  7.  Recommended OTC Powerstep insoles from [employer].  8. Return to clinic in 4 weeks.     Edrick Kins, DPM [facility]  Dr. Edrick Kins, DPM    [address], [place] [address]                Office [phone]  Fax [phone]

## 2019-04-21 ENCOUNTER — Encounter: Payer: Self-pay | Admitting: Internal Medicine

## 2019-04-21 ENCOUNTER — Ambulatory Visit: Payer: Medicare Other | Admitting: Internal Medicine

## 2019-04-21 ENCOUNTER — Telehealth: Payer: Self-pay | Admitting: Internal Medicine

## 2019-04-21 VITALS — BP 124/80 | HR 74 | Temp 98.6°F | Ht 62.0 in | Wt 157.6 lb

## 2019-04-21 DIAGNOSIS — R143 Flatulence: Secondary | ICD-10-CM

## 2019-04-21 DIAGNOSIS — Z1159 Encounter for screening for other viral diseases: Secondary | ICD-10-CM | POA: Diagnosis not present

## 2019-04-21 DIAGNOSIS — Z86010 Personal history of colonic polyps: Secondary | ICD-10-CM | POA: Diagnosis not present

## 2019-04-21 DIAGNOSIS — Z8719 Personal history of other diseases of the digestive system: Secondary | ICD-10-CM

## 2019-04-21 DIAGNOSIS — K625 Hemorrhage of anus and rectum: Secondary | ICD-10-CM

## 2019-04-21 DIAGNOSIS — K648 Other hemorrhoids: Secondary | ICD-10-CM

## 2019-04-21 DIAGNOSIS — K5792 Diverticulitis of intestine, part unspecified, without perforation or abscess without bleeding: Secondary | ICD-10-CM

## 2019-04-21 MED ORDER — NA SULFATE-K SULFATE-MG SULF 17.5-3.13-1.6 GM/177ML PO SOLN: 1.0000 | Freq: Once | ORAL | 0 refills | Status: AC

## 2019-04-21 MED ORDER — HYDROCORTISONE (PERIANAL) 2.5 % EX CREA: 1.0000 "application " | TOPICAL_CREAM | Freq: Every day | CUTANEOUS | 1 refills | Status: AC

## 2019-04-21 NOTE — Telephone Encounter (Signed)
Pt scheduled to see Dr. Henrene Pastor today at 3:20pm, pt aware of appt.

## 2019-04-21 NOTE — Patient Instructions (Signed)
We have sent the following medications to your pharmacy for you to pick up at your convenience:  Anusol HC cream.  Purchase Preparation H over the counter - apply some of the cream to the suppository and insert rectally.    You have been scheduled for a colonoscopy. Please follow written instructions given to you at your visit today.  Please pick up your prep supplies at the pharmacy within the next 1-3 days.

## 2019-04-21 NOTE — Telephone Encounter (Signed)
Have her come in today at the time of the opening.  She should show up 10 minutes before her appointment time.  Thanks

## 2019-04-21 NOTE — Telephone Encounter (Signed)
Pt states on Monday she had a BM and the stool was orange, toilet paper was orange in color when she wiped. Today she had a BM and there was BRB around the stool and on the paper. Pt states she had a "rectal tack and bladder tack" done in 2007. Pt did have internal hemorrhoids on her last colonoscopy. Pt is very anxious and worried. There are no midlevel appts this week. There is an afternoon appt open on your schedule. Please advise.

## 2019-04-21 NOTE — Progress Notes (Signed)
HISTORY OF PRESENT ILLNESS:  [name] is a 63 y.o. female with past medical history as listed below.  She also has a history of small bowel obstruction treated conservatively.  She contacted the office this morning with a chief complaint of rectal bleeding.  She is worked into this afternoon schedule.  She has a history of multiple adenomatous colon polyps.  Previous colonoscopy examinations 2005, 2013, and most recently February 2018.  6 adenomatous polyps at that time.  In addition pandiverticulosis.  Internal hemorrhoids present.  Follow-up in 3 years recommended.  Patient states that she was feeling well except for recent problems with increased intestinal gas and bloating.  3 days ago she had a bowel movement which she described as orange-colored.  The following day of brown bowel movement with reddish tinge.  The next day, the same.  Today she noticed blood more visibly on the bowel movement.  No associated abdominal or rectal pain.  She is concerned.  Her weight has been stable.  REVIEW OF SYSTEMS:  All non-GI ROS negative unless otherwise stated in the HPI except sinus and allergy, anxiety, urinary leakage  Past Medical History:  Diagnosis Date  . Anxiety   . Arthritis   . Bronchitis   . Cancer ([place])    basal cell  . Colon polyp   . COPD (chronic obstructive pulmonary disease) ([place])   . GERD (gastroesophageal reflux disease)   . Hemoptysis   . Hyperlipidemia   . Rhinitis     Past Surgical History:  Procedure Laterality Date  . APPENDECTOMY    . BACK SURGERY    . Bladder prolapse repair  11/08  . VESICOVAGINAL FISTULA CLOSURE W/ TAH  1989    Social History MAYMIE VORNDRAN  reports that she quit smoking about 9 years ago. Her smoking use included cigarettes. She has a 52.50 pack-year smoking history. She has never used smokeless tobacco. She reports that she does not drink alcohol or use drugs.  family history includes Coronary artery disease in her father; Emphysema in  her mother; Osteoporosis in her mother.  No Known Allergies     PHYSICAL EXAMINATION: Vital signs: BP 124/80 (BP Location: Left Arm, Patient Position: Sitting)   Pulse 74   Temp 98.6 F (37 C)   Ht 5\' 2"  (1.575 m)   Wt 157 lb 9.6 oz (71.5 kg)   SpO2 96%   BMI 28.83 kg/m   Constitutional: generally well-appearing, no acute distress Psychiatric: alert and oriented x3, cooperative Eyes: extraocular movements intact, anicteric, conjunctiva pink Mouth: oral pharynx moist, no lesions Neck: supple no lymphadenopathy Cardiovascular: heart regular rate and rhythm, no murmur Lungs: clear to auscultation bilaterally Abdomen: soft, nontender, nondistended, no obvious ascites, no peritoneal signs, normal bowel sounds, no organomegaly Rectal: No external abnormalities.  Internal hemorrhoids with reddish tinge of blood.  No mass or tenderness. Extremities: no clubbing, cyanosis, or lower extremity edema bilaterally Skin: no lesions on visible extremities Neuro: No focal deficits.  Cranial nerves intact  ASSESSMENT:  1.  Recent problems with rectal bleeding likely secondary to internal hemorrhoids. 2.  History of multiple adenomatous colon polyps.  Shortly due for follow-up 3.  Increased gas and bloating 4.  Pandiverticulosis 5.  History of small bowel obstruction treated conservatively   PLAN:  1.  Prescribed Anusol medicated suppositories.  Take 1 at night.  Refills 2.  Increase fiber 3.  Schedule colonoscopy to be certain bleeding due to other source as well as provide polyp surveillance,  as she is shortly due.The nature of the procedure, as well as the risks, benefits, and alternatives were carefully and thoroughly reviewed with the patient. Ample time for discussion and questions allowed. The patient understood, was satisfied, and agreed to proceed.

## 2019-05-09 ENCOUNTER — Ambulatory Visit: Payer: Medicare Other | Admitting: Podiatry

## 2019-05-09 ENCOUNTER — Other Ambulatory Visit: Payer: Self-pay

## 2019-05-09 DIAGNOSIS — M722 Plantar fascial fibromatosis: Secondary | ICD-10-CM | POA: Diagnosis not present

## 2019-05-11 ENCOUNTER — Telehealth: Payer: Self-pay | Admitting: Internal Medicine

## 2019-05-11 NOTE — Telephone Encounter (Signed)
Pt is scheduled for 05/27/19 colon and stated that insurance does not cover prep solution.  She asked for a coupon.

## 2019-05-12 NOTE — Progress Notes (Signed)
   Subjective: 64 y.o. female presenting today for follow up evaluation of plantar fasciitis of the right foot. She states the injection helped alleviate her pain but did not resolve it completely. She reports some continued pain which she has been treating by taking Meloxicam, using the plantar fascial brace and OTC insoles. There are no aggravating factors noted. Patient is here for further evaluation and treatment.   Past Medical History:  Diagnosis Date  . Anxiety   . Arthritis   . Bronchitis   . Cancer ([place])    basal cell  . Colon polyp   . COPD (chronic obstructive pulmonary disease) ([place])   . GERD (gastroesophageal reflux disease)   . Hemoptysis   . Hyperlipidemia   . Rhinitis      Objective: Physical Exam General: The patient is alert and oriented x3 in no acute distress.  Dermatology: Skin is warm, dry and supple bilateral lower extremities. Negative for open lesions or macerations bilateral.   Vascular: Dorsalis Pedis and Posterior Tibial pulses palpable bilateral.  Capillary fill time is immediate to all digits.  Neurological: Epicritic and protective threshold intact bilateral.   Musculoskeletal: Tenderness to palpation to the plantar aspect of the right heel along the plantar fascia. All other joints range of motion within normal limits bilateral. Strength 5/5 in all groups bilateral.   Assessment: 1. Plantar fasciitis right - improved   Plan of Care:  1. Patient evaluated.  2. Injection of 0.5cc Celestone soluspan injected into the right plantar fascia  3. Continue using OTC insoles.  4. Continue taking Meloxicam.  5. Continue using plantar fascial brace.  6. Return to clinic in 6 weeks.    Edrick Kins, DPM [facility]  Dr. Edrick Kins, DPM    [address], [place] [address]                Office [phone]  Fax [phone]

## 2019-05-13 NOTE — Telephone Encounter (Signed)
Spoke with patient and told her I was anticipating some Suprep samples next week. I told her I would wait a few days to see if they came and if so, call her to come pick it up.  If I do not think they will arrive him time, I will switch her prep and give her a sample of that.  Patient agreed.

## 2019-05-23 ENCOUNTER — Telehealth: Payer: Self-pay

## 2019-05-23 NOTE — Telephone Encounter (Signed)
Spoke with patient and told her I did have a sample of Suprep and would leave it up front for her to pick up. She agreed.  She also asked about getting the Covid vaccine so I told her I would put a note on the prep with the website and phone number she could call to find out about it.  Patient agreed.

## 2019-05-25 ENCOUNTER — Other Ambulatory Visit: Payer: Self-pay | Admitting: Internal Medicine

## 2019-05-25 ENCOUNTER — Ambulatory Visit (INDEPENDENT_AMBULATORY_CARE_PROVIDER_SITE_OTHER): Payer: PPO

## 2019-05-25 DIAGNOSIS — Z1159 Encounter for screening for other viral diseases: Secondary | ICD-10-CM

## 2019-05-25 LAB — SARS CORONAVIRUS 2 (TAT 6-24 HRS): SARS Coronavirus 2: NEGATIVE

## 2019-05-27 ENCOUNTER — Other Ambulatory Visit: Payer: Self-pay

## 2019-05-27 ENCOUNTER — Ambulatory Visit (AMBULATORY_SURGERY_CENTER): Payer: PPO | Admitting: Internal Medicine

## 2019-05-27 ENCOUNTER — Encounter: Payer: Self-pay | Admitting: Internal Medicine

## 2019-05-27 VITALS — BP 123/58 | HR 69 | Temp 97.7°F | Resp 15 | Ht 62.0 in | Wt 157.0 lb

## 2019-05-27 DIAGNOSIS — K625 Hemorrhage of anus and rectum: Secondary | ICD-10-CM

## 2019-05-27 DIAGNOSIS — D122 Benign neoplasm of ascending colon: Secondary | ICD-10-CM

## 2019-05-27 DIAGNOSIS — D124 Benign neoplasm of descending colon: Secondary | ICD-10-CM | POA: Diagnosis not present

## 2019-05-27 DIAGNOSIS — Z86010 Personal history of colonic polyps: Secondary | ICD-10-CM | POA: Diagnosis not present

## 2019-05-27 DIAGNOSIS — K219 Gastro-esophageal reflux disease without esophagitis: Secondary | ICD-10-CM | POA: Diagnosis not present

## 2019-05-27 DIAGNOSIS — J449 Chronic obstructive pulmonary disease, unspecified: Secondary | ICD-10-CM | POA: Diagnosis not present

## 2019-05-27 MED ORDER — SODIUM CHLORIDE 0.9 % IV SOLN: 500.0000 mL | Freq: Once | INTRAVENOUS | Status: DC

## 2019-05-27 NOTE — Progress Notes (Signed)
Report to PACU, RN, vss, BBS= Clear.  

## 2019-05-27 NOTE — Op Note (Signed)
[facility] Patient Name: [name] Procedure Date: 05/27/2019 2:06 PM MRN: [id number] Endoscopist: Docia Chuck. Henrene Pastor , MD Age: 64 Referring MD:  Date of Birth: 06-20-1955 Gender: Female Account #: [id number] Procedure:                Colonoscopy with cold snare polypectomy x 3 Indications:              High risk colon cancer surveillance: Personal                            history of multiple (3 or more) adenomas. Previous                            examinations 2005, 2010, 2013, 2018 Medicines:                Monitored Anesthesia Care Procedure:                Pre-Anesthesia Assessment:                           - Prior to the procedure, a History and Physical                            was performed, and patient medications and                            allergies were reviewed. The patient's tolerance of                            previous anesthesia was also reviewed. The risks                            and benefits of the procedure and the sedation                            options and risks were discussed with the patient.                            All questions were answered, and informed consent                            was obtained. Prior Anticoagulants: The patient has                            taken no previous anticoagulant or antiplatelet                            agents. ASA Grade Assessment: II - A patient with                            mild systemic disease. After reviewing the risks                            and benefits, the patient was deemed in  satisfactory condition to undergo the procedure.                           After obtaining informed consent, the colonoscope                            was passed under direct vision. Throughout the                            procedure, the patient's blood pressure, pulse, and                            oxygen saturations were monitored continuously. The                             Colonoscope was introduced through the anus and                            advanced to the the cecum, identified by                            appendiceal orifice and ileocecal valve. The                            ileocecal valve, appendiceal orifice, and rectum                            were photographed. The quality of the bowel                            preparation was excellent. The colonoscopy was                            performed without difficulty. The patient tolerated                            the procedure well. The bowel preparation used was                            SUPREP via split dose instruction. Scope In: 2:21:59 PM Scope Out: 2:39:33 PM Scope Withdrawal Time: 0 hours 8 minutes 1 second  Total Procedure Duration: 0 hours 17 minutes 34 seconds  Findings:                 Three polyps were found in the descending colon and                            ascending colon. The polyps were 1 to 3 mm in size.                            These polyps were removed with a cold snare.                            Resection and retrieval were complete.  Multiple small and large-mouthed diverticula were                            found in the entire colon.                           The exam was otherwise without abnormality on                            direct and retroflexion views. Complications:            No immediate complications. Estimated blood loss:                            None. Estimated Blood Loss:     Estimated blood loss: none. Impression:               - Three 1 to 3 mm polyps in the descending colon                            and in the ascending colon, removed with a cold                            snare. Resected and retrieved.                           - Diverticulosis in the entire examined colon.                           - The examination was otherwise normal on direct                            and retroflexion  views. Recommendation:           - Repeat colonoscopy in 5 years for surveillance.                           - Patient has a contact number available for                            emergencies. The signs and symptoms of potential                            delayed complications were discussed with the                            patient. Return to normal activities tomorrow.                            Written discharge instructions were provided to the                            patient.                           - Resume previous diet.                           -  Continue present medications.                           - Await pathology results. Docia Chuck. Henrene Pastor, MD 05/27/2019 2:44:21 PM This report has been signed electronically.

## 2019-05-27 NOTE — Patient Instructions (Signed)
Handouts on diverticulosis and polyps given to you today.  Await pathology results.    YOU HAD AN ENDOSCOPIC PROCEDURE TODAY AT [facility]:   Refer to the procedure report that was given to you for any specific questions about what was found during the examination.  If the procedure report does not answer your questions, please call your gastroenterologist to clarify.  If you requested that your care partner not be given the details of your procedure findings, then the procedure report has been included in a sealed envelope for you to review at your convenience later.  YOU SHOULD EXPECT: Some feelings of bloating in the abdomen. Passage of more gas than usual.  Walking can help get rid of the air that was put into your GI tract during the procedure and reduce the bloating. If you had a lower endoscopy (such as a colonoscopy or flexible sigmoidoscopy) you may notice spotting of blood in your stool or on the toilet paper. If you underwent a bowel prep for your procedure, you may not have a normal bowel movement for a few days.  Please Note:  You might notice some irritation and congestion in your nose or some drainage.  This is from the oxygen used during your procedure.  There is no need for concern and it should clear up in a day or so.  SYMPTOMS TO REPORT IMMEDIATELY:   Following lower endoscopy (colonoscopy or flexible sigmoidoscopy):  Excessive amounts of blood in the stool  Significant tenderness or worsening of abdominal pains  Swelling of the abdomen that is new, acute  Fever of 100F or higher  For urgent or emergent issues, a gastroenterologist can be reached at any hour by calling [phone].   DIET:  We do recommend a small meal at first, but then you may proceed to your regular diet.  Drink plenty of fluids but you should avoid alcoholic beverages for 24 hours.  ACTIVITY:  You should plan to take it easy for the rest of today and you should NOT DRIVE or use  heavy machinery until tomorrow (because of the sedation medicines used during the test).    FOLLOW UP: Our staff will call the number listed on your records 48-72 hours following your procedure to check on you and address any questions or concerns that you may have regarding the information given to you following your procedure. If we do not reach you, we will leave a message.  We will attempt to reach you two times.  During this call, we will ask if you have developed any symptoms of COVID 19. If you develop any symptoms (ie: fever, flu-like symptoms, shortness of breath, cough etc.) before then, please call [phone].  If you test positive for Covid 19 in the 2 weeks post procedure, please call and report this information to [place].    If any biopsies were taken you will be contacted by phone or by letter within the next 1-3 weeks.  Please call us at [phone] if you have not heard about the biopsies in 3 weeks.    SIGNATURES/CONFIDENTIALITY: You and/or your care partner have signed paperwork which will be entered into your electronic medical record.  These signatures attest to the fact that that the information above on your After Visit Summary has been reviewed and is understood.  Full responsibility of the confidentiality of this discharge information lies with you and/or your care-partner.

## 2019-05-27 NOTE — Progress Notes (Signed)
Temperature taken by J.B., VS taken by D.T. 

## 2019-05-31 ENCOUNTER — Telehealth: Payer: Self-pay

## 2019-05-31 NOTE — Telephone Encounter (Signed)
  Follow up Call-  Call back number 05/27/2019  Post procedure Call Back phone  # [phone]  Permission to leave phone message Yes  Some recent data might be hidden     Patient questions:  Do you have a fever, pain , or abdominal swelling? No. Pain Score  0 *  Have you tolerated food without any problems? Yes.    Have you been able to return to your normal activities? Yes.    Do you have any questions about your discharge instructions: Diet   No. Medications  No. Follow up visit  No.  Do you have questions or concerns about your Care? No.  Actions: * If pain score is 4 or above: No action needed, pain <4.   1. Have you developed a fever since your procedure? No  2.   Have you had an respiratory symptoms (SOB or cough) since your procedure? No  3.   Have you tested positive for COVID 19 since your procedure No  4.   Have you had any family members/close contacts diagnosed with the COVID 19 since your procedure?  No   If yes to any of these questions please route to Joylene John, RN and Alphonsa Gin, RN.

## 2019-06-01 ENCOUNTER — Encounter: Payer: Self-pay | Admitting: Internal Medicine

## 2019-06-07 ENCOUNTER — Encounter: Payer: Self-pay | Admitting: Internal Medicine

## 2019-06-19 ENCOUNTER — Ambulatory Visit: Payer: PPO | Attending: Internal Medicine

## 2019-06-19 DIAGNOSIS — Z23 Encounter for immunization: Secondary | ICD-10-CM

## 2019-06-19 NOTE — Progress Notes (Signed)
   Covid-19 Vaccination Clinic  Name:  [name]    MRN: [id number] DOB: Dec 24, 1955  06/19/2019  [name] was observed post Covid-19 immunization for 15 minutes without incidence. She was provided with Vaccine Information Sheet and instruction to access the V-Safe system.   [name] was instructed to call 911 with any severe reactions post vaccine: Marland Kitchen Difficulty breathing  . Swelling of your face and throat  . A fast heartbeat  . A bad rash all over your body  . Dizziness and weakness    Immunizations Administered    Name Date Dose VIS Date Route   Pfizer COVID-19 Vaccine 06/19/2019 11:20 AM 0.3 mL 04/15/2019 Intramuscular   Manufacturer: [place]   Lot: [id number]   [place]: [id number]

## 2019-06-20 ENCOUNTER — Ambulatory Visit: Payer: PPO | Admitting: Podiatry

## 2019-07-06 ENCOUNTER — Other Ambulatory Visit: Payer: Self-pay | Admitting: Podiatry

## 2019-07-19 ENCOUNTER — Ambulatory Visit: Payer: PPO | Attending: Internal Medicine

## 2019-07-19 DIAGNOSIS — Z23 Encounter for immunization: Secondary | ICD-10-CM

## 2019-07-19 NOTE — Progress Notes (Signed)
   Covid-19 Vaccination Clinic  Name:  [name]    MRN: [id number] DOB: [birth year]  07/19/2019  [name] was observed post Covid-19 immunization for 15 minutes without incident. She was provided with Vaccine Information Sheet and instruction to access the V-Safe system.   [name] was instructed to call 911 with any severe reactions post vaccine: Marland Kitchen Difficulty breathing  . Swelling of face and throat  . A fast heartbeat  . A bad rash all over body  . Dizziness and weakness   Immunizations Administered    Name Date Dose VIS Date Route   Pfizer COVID-19 Vaccine 07/19/2019 11:28 AM 0.3 mL 04/15/2019 Intramuscular   Manufacturer: [place]   Lot: [id number]   [place]: [id number]

## 2019-08-25 DIAGNOSIS — L578 Other skin changes due to chronic exposure to nonionizing radiation: Secondary | ICD-10-CM | POA: Diagnosis not present

## 2019-08-25 DIAGNOSIS — D2361 Other benign neoplasm of skin of right upper limb, including shoulder: Secondary | ICD-10-CM | POA: Diagnosis not present

## 2019-08-25 DIAGNOSIS — L570 Actinic keratosis: Secondary | ICD-10-CM | POA: Diagnosis not present

## 2019-08-25 DIAGNOSIS — L821 Other seborrheic keratosis: Secondary | ICD-10-CM | POA: Diagnosis not present

## 2019-08-25 DIAGNOSIS — Z85828 Personal history of other malignant neoplasm of skin: Secondary | ICD-10-CM | POA: Diagnosis not present

## 2019-08-25 DIAGNOSIS — D485 Neoplasm of uncertain behavior of skin: Secondary | ICD-10-CM | POA: Diagnosis not present

## 2019-08-25 DIAGNOSIS — C44712 Basal cell carcinoma of skin of right lower limb, including hip: Secondary | ICD-10-CM | POA: Diagnosis not present

## 2019-08-25 DIAGNOSIS — Z808 Family history of malignant neoplasm of other organs or systems: Secondary | ICD-10-CM | POA: Diagnosis not present

## 2019-09-05 DIAGNOSIS — N760 Acute vaginitis: Secondary | ICD-10-CM | POA: Diagnosis not present

## 2019-09-20 DIAGNOSIS — L988 Other specified disorders of the skin and subcutaneous tissue: Secondary | ICD-10-CM | POA: Diagnosis not present

## 2019-09-20 DIAGNOSIS — C44712 Basal cell carcinoma of skin of right lower limb, including hip: Secondary | ICD-10-CM | POA: Diagnosis not present

## 2019-10-11 DIAGNOSIS — T148XXA Other injury of unspecified body region, initial encounter: Secondary | ICD-10-CM | POA: Diagnosis not present

## 2019-10-28 ENCOUNTER — Other Ambulatory Visit: Payer: Self-pay | Admitting: Podiatry

## 2019-11-14 DIAGNOSIS — T8149XA Infection following a procedure, other surgical site, initial encounter: Secondary | ICD-10-CM | POA: Diagnosis not present

## 2019-12-29 ENCOUNTER — Other Ambulatory Visit: Payer: Self-pay | Admitting: Podiatry

## 2020-01-16 DIAGNOSIS — Z6830 Body mass index (BMI) 30.0-30.9, adult: Secondary | ICD-10-CM | POA: Diagnosis not present

## 2020-01-16 DIAGNOSIS — Z1272 Encounter for screening for malignant neoplasm of vagina: Secondary | ICD-10-CM | POA: Diagnosis not present

## 2020-01-16 DIAGNOSIS — Z90710 Acquired absence of both cervix and uterus: Secondary | ICD-10-CM | POA: Diagnosis not present

## 2020-01-16 DIAGNOSIS — N958 Other specified menopausal and perimenopausal disorders: Secondary | ICD-10-CM | POA: Diagnosis not present

## 2020-01-16 DIAGNOSIS — Z1231 Encounter for screening mammogram for malignant neoplasm of breast: Secondary | ICD-10-CM | POA: Diagnosis not present

## 2020-01-16 DIAGNOSIS — Z01419 Encounter for gynecological examination (general) (routine) without abnormal findings: Secondary | ICD-10-CM | POA: Diagnosis not present

## 2020-01-16 DIAGNOSIS — Z124 Encounter for screening for malignant neoplasm of cervix: Secondary | ICD-10-CM | POA: Diagnosis not present

## 2020-01-19 DIAGNOSIS — Z23 Encounter for immunization: Secondary | ICD-10-CM | POA: Diagnosis not present

## 2020-01-24 DIAGNOSIS — Z20822 Contact with and (suspected) exposure to covid-19: Secondary | ICD-10-CM | POA: Diagnosis not present

## 2020-02-13 ENCOUNTER — Other Ambulatory Visit: Payer: Self-pay | Admitting: Podiatry

## 2020-02-24 ENCOUNTER — Telehealth: Payer: Self-pay | Admitting: Internal Medicine

## 2020-02-24 NOTE — Telephone Encounter (Signed)
Spoke with pt and she has not had another episode. She will use the suppositories and hydrocortisone cream she has at home. Pt knows to if she has another episode to go to the ER or urgent care to be evaluated.

## 2020-02-24 NOTE — Telephone Encounter (Signed)
Patient called states she has rectal bleeding since this morning. Patient said she felt like she had to go to the bathroom with diarrhea but it was just bright red blood what came out please advise

## 2020-02-24 NOTE — Telephone Encounter (Signed)
Pt states she takes miralax regularly and last night she had a lot of gas. She had a BM this morning and the beginning of it was black, then there was a red tint on the tissue. She went out this morning and her stomach and back started hurting. Reports she went to the bathroom and reports she passed a lot of BRBPR, states the toilet water was colored red. Pt states she has been laying down since passing the blood. Please advise.

## 2020-03-10 ENCOUNTER — Other Ambulatory Visit: Payer: Self-pay | Admitting: Podiatry

## 2020-03-13 ENCOUNTER — Telehealth: Payer: Self-pay | Admitting: Podiatry

## 2020-03-13 NOTE — Telephone Encounter (Signed)
Patient would like a refill on her meloxicam for 3 months. The pharmacy has been sending over faxes to get this refilled

## 2020-03-14 ENCOUNTER — Other Ambulatory Visit: Payer: Self-pay

## 2020-03-14 ENCOUNTER — Other Ambulatory Visit: Payer: Self-pay | Admitting: Podiatry

## 2020-03-14 MED ORDER — MELOXICAM 15 MG PO TABS: 15.0000 mg | ORAL_TABLET | Freq: Every day | ORAL | 0 refills | Status: AC

## 2020-03-14 NOTE — Telephone Encounter (Signed)
Please advise 

## 2020-03-14 NOTE — Progress Notes (Signed)
PRN foot pain

## 2020-03-14 NOTE — Telephone Encounter (Signed)
Rx sent 

## 2020-03-21 DIAGNOSIS — E559 Vitamin D deficiency, unspecified: Secondary | ICD-10-CM | POA: Diagnosis not present

## 2020-03-21 DIAGNOSIS — G479 Sleep disorder, unspecified: Secondary | ICD-10-CM | POA: Diagnosis not present

## 2020-03-21 DIAGNOSIS — J449 Chronic obstructive pulmonary disease, unspecified: Secondary | ICD-10-CM | POA: Diagnosis not present

## 2020-03-21 DIAGNOSIS — F330 Major depressive disorder, recurrent, mild: Secondary | ICD-10-CM | POA: Diagnosis not present

## 2020-03-21 DIAGNOSIS — K219 Gastro-esophageal reflux disease without esophagitis: Secondary | ICD-10-CM | POA: Diagnosis not present

## 2020-03-21 DIAGNOSIS — Z79899 Other long term (current) drug therapy: Secondary | ICD-10-CM | POA: Diagnosis not present

## 2020-03-21 DIAGNOSIS — E785 Hyperlipidemia, unspecified: Secondary | ICD-10-CM | POA: Diagnosis not present

## 2020-03-21 DIAGNOSIS — Z0000 Encounter for general adult medical examination without abnormal findings: Secondary | ICD-10-CM | POA: Diagnosis not present

## 2020-03-21 DIAGNOSIS — F411 Generalized anxiety disorder: Secondary | ICD-10-CM | POA: Diagnosis not present

## 2020-03-21 DIAGNOSIS — I700 Atherosclerosis of aorta: Secondary | ICD-10-CM | POA: Diagnosis not present

## 2020-03-21 DIAGNOSIS — N3281 Overactive bladder: Secondary | ICD-10-CM | POA: Diagnosis not present

## 2020-07-20 ENCOUNTER — Other Ambulatory Visit: Payer: Self-pay

## 2020-07-20 ENCOUNTER — Telehealth: Payer: Self-pay | Admitting: Internal Medicine

## 2020-07-20 ENCOUNTER — Inpatient Hospital Stay (HOSPITAL_COMMUNITY)
Admission: EM | Admit: 2020-07-20 | Discharge: 2020-07-23 | DRG: 378 | Disposition: A | Discharge status: Home / Self Care | Payer: Medicare HMO | Attending: Family Medicine | Admitting: Family Medicine

## 2020-07-20 ENCOUNTER — Encounter (HOSPITAL_COMMUNITY): Payer: Self-pay | Admitting: Emergency Medicine

## 2020-07-20 DIAGNOSIS — F419 Anxiety disorder, unspecified: Secondary | ICD-10-CM | POA: Diagnosis not present

## 2020-07-20 DIAGNOSIS — K219 Gastro-esophageal reflux disease without esophagitis: Secondary | ICD-10-CM | POA: Diagnosis present

## 2020-07-20 DIAGNOSIS — Z825 Family history of asthma and other chronic lower respiratory diseases: Secondary | ICD-10-CM | POA: Diagnosis not present

## 2020-07-20 DIAGNOSIS — K922 Gastrointestinal hemorrhage, unspecified: Secondary | ICD-10-CM | POA: Diagnosis present

## 2020-07-20 DIAGNOSIS — Z791 Long term (current) use of non-steroidal anti-inflammatories (NSAID): Secondary | ICD-10-CM

## 2020-07-20 DIAGNOSIS — K5791 Diverticulosis of intestine, part unspecified, without perforation or abscess with bleeding: Secondary | ICD-10-CM | POA: Diagnosis not present

## 2020-07-20 DIAGNOSIS — R9431 Abnormal electrocardiogram [ECG] [EKG]: Secondary | ICD-10-CM | POA: Diagnosis not present

## 2020-07-20 DIAGNOSIS — Z20822 Contact with and (suspected) exposure to covid-19: Secondary | ICD-10-CM | POA: Diagnosis present

## 2020-07-20 DIAGNOSIS — J449 Chronic obstructive pulmonary disease, unspecified: Secondary | ICD-10-CM | POA: Diagnosis not present

## 2020-07-20 DIAGNOSIS — Z79899 Other long term (current) drug therapy: Secondary | ICD-10-CM

## 2020-07-20 DIAGNOSIS — D62 Acute posthemorrhagic anemia: Secondary | ICD-10-CM | POA: Diagnosis not present

## 2020-07-20 DIAGNOSIS — M1990 Unspecified osteoarthritis, unspecified site: Secondary | ICD-10-CM | POA: Diagnosis present

## 2020-07-20 DIAGNOSIS — E785 Hyperlipidemia, unspecified: Secondary | ICD-10-CM | POA: Diagnosis present

## 2020-07-20 DIAGNOSIS — Z7982 Long term (current) use of aspirin: Secondary | ICD-10-CM | POA: Diagnosis not present

## 2020-07-20 DIAGNOSIS — Z86010 Personal history of colonic polyps: Secondary | ICD-10-CM | POA: Diagnosis not present

## 2020-07-20 DIAGNOSIS — K5731 Diverticulosis of large intestine without perforation or abscess with bleeding: Secondary | ICD-10-CM | POA: Diagnosis not present

## 2020-07-20 DIAGNOSIS — Z8249 Family history of ischemic heart disease and other diseases of the circulatory system: Secondary | ICD-10-CM | POA: Diagnosis not present

## 2020-07-20 DIAGNOSIS — Z90710 Acquired absence of both cervix and uterus: Secondary | ICD-10-CM | POA: Diagnosis not present

## 2020-07-20 DIAGNOSIS — K625 Hemorrhage of anus and rectum: Secondary | ICD-10-CM

## 2020-07-20 DIAGNOSIS — Z87891 Personal history of nicotine dependence: Secondary | ICD-10-CM | POA: Diagnosis not present

## 2020-07-20 DIAGNOSIS — Z8262 Family history of osteoporosis: Secondary | ICD-10-CM

## 2020-07-20 DIAGNOSIS — Z888 Allergy status to other drugs, medicaments and biological substances status: Secondary | ICD-10-CM

## 2020-07-20 DIAGNOSIS — D649 Anemia, unspecified: Secondary | ICD-10-CM | POA: Diagnosis not present

## 2020-07-20 HISTORY — DX: Anemia, unspecified: D64.9

## 2020-07-20 LAB — COMPREHENSIVE METABOLIC PANEL
ALT: 19 U/L (ref 0–44)
AST: 24 U/L (ref 15–41)
Albumin: 4 g/dL (ref 3.5–5.0)
Alkaline Phosphatase: 70 U/L (ref 38–126)
Anion gap: 13 (ref 5–15)
BUN: 19 mg/dL (ref 8–23)
CO2: 21 mmol/L — ABNORMAL LOW (ref 22–32)
Calcium: 8.9 mg/dL (ref 8.9–10.3)
Chloride: 105 mmol/L (ref 98–111)
Creatinine, Ser: 0.73 mg/dL (ref 0.44–1.00)
GFR, Estimated: >60 mL/min (ref >60)
Glucose, Bld: 124 mg/dL — ABNORMAL HIGH (ref 70–99)
Potassium: 4.1 mmol/L (ref 3.5–5.1)
Sodium: 139 mmol/L (ref 135–145)
Total Bilirubin: 0.7 mg/dL (ref 0.3–1.2)
Total Protein: 6.5 g/dL (ref 6.5–8.1)

## 2020-07-20 LAB — TYPE AND SCREEN
ABO/RH(D): O POS
Antibody Screen: NEGATIVE

## 2020-07-20 LAB — RESP PANEL BY RT-PCR (FLU A&B, COVID) ARPGX2
Influenza A by PCR: NEGATIVE
Influenza B by PCR: NEGATIVE
SARS Coronavirus 2 by RT PCR: NEGATIVE

## 2020-07-20 LAB — HEMOGLOBIN AND HEMATOCRIT, BLOOD
HCT: 31.8 % — ABNORMAL LOW (ref 36.0–46.0)
HCT: 32.1 % — ABNORMAL LOW (ref 36.0–46.0)
Hemoglobin: 10.3 g/dL — ABNORMAL LOW (ref 12.0–15.0)
Hemoglobin: 10.4 g/dL — ABNORMAL LOW (ref 12.0–15.0)

## 2020-07-20 LAB — CBC
HCT: 32.9 % — ABNORMAL LOW (ref 36.0–46.0)
Hemoglobin: 10.9 g/dL — ABNORMAL LOW (ref 12.0–15.0)
MCH: 30.5 pg (ref 26.0–34.0)
MCHC: 33.1 g/dL (ref 30.0–36.0)
MCV: 92.2 fL (ref 80.0–100.0)
Platelets: 246 10*3/uL (ref 150–400)
RBC: 3.57 MIL/uL — ABNORMAL LOW (ref 3.87–5.11)
RDW: 15 % (ref 11.5–15.5)
WBC: 8 10*3/uL (ref 4.0–10.5)
nRBC: 0 % (ref 0.0–0.2)

## 2020-07-20 LAB — HIV ANTIBODY (ROUTINE TESTING W REFLEX): HIV Screen 4th Generation wRfx: NONREACTIVE

## 2020-07-20 LAB — ABO/RH: ABO/RH(D): O POS

## 2020-07-20 MED ORDER — ATORVASTATIN CALCIUM 40 MG PO TABS
40.0000 mg | ORAL_TABLET | Freq: Every day | ORAL | Status: DC
  Administered 2020-07-21 – 2020-07-23 (×3): 40 mg via ORAL
  Filled 2020-07-20 (×3): qty 1

## 2020-07-20 MED ORDER — ALBUTEROL SULFATE HFA 108 (90 BASE) MCG/ACT IN AERS
2.0000 | INHALATION_SPRAY | RESPIRATORY_TRACT | Status: DC | PRN
  Filled 2020-07-20: qty 6.7

## 2020-07-20 MED ORDER — MULTIVITAMINS PO CAPS: 1.0000 | ORAL_CAPSULE | Freq: Every day | ORAL | Status: DC

## 2020-07-20 MED ORDER — ZINC GLUCONATE 50 MG PO TABS: 50.0000 mg | ORAL_TABLET | Freq: Every day | ORAL | Status: DC

## 2020-07-20 MED ORDER — ASCORBIC ACID 500 MG PO TABS
500.0000 mg | ORAL_TABLET | Freq: Every day | ORAL | Status: DC
  Administered 2020-07-20 – 2020-07-23 (×4): 500 mg via ORAL
  Filled 2020-07-20 (×4): qty 1

## 2020-07-20 MED ORDER — ZINC SULFATE 220 (50 ZN) MG PO CAPS
220.0000 mg | ORAL_CAPSULE | Freq: Every day | ORAL | Status: DC
  Administered 2020-07-21 – 2020-07-23 (×3): 220 mg via ORAL
  Filled 2020-07-20 (×3): qty 1

## 2020-07-20 MED ORDER — ACETAMINOPHEN 325 MG PO TABS
650.0000 mg | ORAL_TABLET | Freq: Four times a day (QID) | ORAL | Status: DC | PRN
  Administered 2020-07-21: 650 mg via ORAL
  Filled 2020-07-20: qty 2

## 2020-07-20 MED ORDER — ADULT MULTIVITAMIN W/MINERALS CH
1.0000 | ORAL_TABLET | Freq: Every day | ORAL | Status: DC
  Administered 2020-07-20 – 2020-07-23 (×4): 1 via ORAL
  Filled 2020-07-20 (×4): qty 1

## 2020-07-20 MED ORDER — ACETAMINOPHEN 650 MG RE SUPP: 650.0000 mg | Freq: Four times a day (QID) | RECTAL | Status: DC | PRN

## 2020-07-20 MED ORDER — VALACYCLOVIR HCL 500 MG PO TABS
500.0000 mg | ORAL_TABLET | Freq: Every day | ORAL | Status: DC
Start: 2020-07-21 — End: 2020-07-23
  Administered 2020-07-21 – 2020-07-23 (×3): 500 mg via ORAL
  Filled 2020-07-20 (×3): qty 1

## 2020-07-20 MED ORDER — FLUTICASONE PROPIONATE 50 MCG/ACT NA SUSP
1.0000 | Freq: Every day | NASAL | Status: DC
  Administered 2020-07-21 – 2020-07-23 (×3): 1 via NASAL
  Filled 2020-07-20: qty 16

## 2020-07-20 MED ORDER — OXYBUTYNIN CHLORIDE 5 MG PO TABS
5.0000 mg | ORAL_TABLET | Freq: Every day | ORAL | Status: DC
  Administered 2020-07-21 – 2020-07-23 (×3): 5 mg via ORAL
  Filled 2020-07-20 (×3): qty 1

## 2020-07-20 MED ORDER — ONDANSETRON HCL 4 MG PO TABS: 4.0000 mg | ORAL_TABLET | Freq: Four times a day (QID) | ORAL | Status: DC | PRN

## 2020-07-20 MED ORDER — ESCITALOPRAM OXALATE 20 MG PO TABS
20.0000 mg | ORAL_TABLET | Freq: Every day | ORAL | Status: DC
  Administered 2020-07-21 – 2020-07-23 (×3): 20 mg via ORAL
  Filled 2020-07-20 (×3): qty 1

## 2020-07-20 MED ORDER — MONTELUKAST SODIUM 10 MG PO TABS
10.0000 mg | ORAL_TABLET | Freq: Every day | ORAL | Status: DC
  Administered 2020-07-20 – 2020-07-22 (×3): 10 mg via ORAL
  Filled 2020-07-20 (×3): qty 1

## 2020-07-20 MED ORDER — SODIUM CHLORIDE 0.9 % IV SOLN: Freq: Once | INTRAVENOUS | Status: AC

## 2020-07-20 MED ORDER — ALPRAZOLAM 0.5 MG PO TABS
0.5000 mg | ORAL_TABLET | Freq: Every day | ORAL | Status: DC
  Administered 2020-07-20 – 2020-07-22 (×3): 0.5 mg via ORAL
  Filled 2020-07-20 (×3): qty 1

## 2020-07-20 MED ORDER — VITAMIN D 25 MCG (1000 UNIT) PO TABS
1000.0000 [IU] | ORAL_TABLET | Freq: Every day | ORAL | Status: DC
  Administered 2020-07-21 – 2020-07-23 (×3): 1000 [IU] via ORAL
  Filled 2020-07-20 (×3): qty 1

## 2020-07-20 MED ORDER — BUDESONIDE 0.25 MG/2ML IN SUSP
0.2500 mg | Freq: Two times a day (BID) | RESPIRATORY_TRACT | Status: DC
  Administered 2020-07-20 – 2020-07-23 (×6): 0.25 mg via RESPIRATORY_TRACT
  Filled 2020-07-20 (×6): qty 2

## 2020-07-20 MED ORDER — ONDANSETRON HCL 4 MG/2ML IJ SOLN: 4.0000 mg | Freq: Four times a day (QID) | INTRAMUSCULAR | Status: DC | PRN

## 2020-07-20 NOTE — Consult Note (Signed)
Referring Provider: Dr. Dewaine Conger Primary Care Physician:  Jonathon Jordan, MD Primary Gastroenterologist:  Dr. Scarlette Shorts   Reason for Consultation:  Hematochezia   HPI: [name] is a 65 y.o. female with a past medical history of anxiety, arthritis, hyperlipidemia, COPD, SBO, GERD and tubular adenomatous colon polyps.  She developed lower abdominal pain yesterday afternoon. She though it was possibly due to taking Diflucan x 1 dose earlier in the morning due to having a vaginal yeasts infection. Around 7pm, she went to the bathroom and passed a large volume of bright red blood per the rectum without passing any stool. She Inserted an Anusol suppository into the rectum as she was concerned she might be having hemorrhoidal bleeding. She awakened around 4:45am this morning and she was soiled in a small amount of red blood. She went to the bathroom and passed a moderate amount of blood per the rectum x 3 more episodes with associated lower abdominal pain.  She took a shower and she felt light headed. Her husband assisted her out of the shower.  She contacted Dr. Henrene Pastor and she was advised to present to the ED for further evaluation.  She was tachycardic in the ED. BP stable. Labs showed a hemoglobin 10.9.  (baseline Hg 12.1 03/2020).  BUN 19. She went to the bathroom in the ED to urinate and she saw a small amount of blood on the toilet tissue. She felt a little light headed. No CP or SOB. She takes ASA 26m daily and Meloxicam 7.537mone tab daily. No GERD symptoms. She typically passes a normal brown formed stool daily. Her most recent colonoscopy was her most recent colonoscopy was 05/27/2019 which identified 3 polyps which were removed from the descending and ascending colon and multiple small on larg[place] diverticular were found throughout the entire colon.  Colonoscopy 05/27/2019: - Three 1 to 3 mm polyps in the descending colon and in the ascending colon, removed with a cold snare. Resected and  retrieved. - Diverticulosis in the entire examined colon. - The examination was otherwise normal on direct and retroflexion views.  Colonoscopy 09/01/2016: - Six 3 to 5 mm polyps in the sigmoid colon, in the transverse colon and in the ascending colon, removed with a cold snare. Resected and retrieved. - Diverticulosis in the entire examined colon. - Internal hemorrhoids. - The examination was otherwise normal on direct and retroflexion views.  Colonoscopy 02/26/2012: Severe diverticulosis throughout the colon No polyp  Past Medical History:  Diagnosis Date  . Anxiety   . Arthritis   . Bronchitis   . [place]   basal [place]  . Colon polyp   . [place] disease[place]  . GERD (gastroesophageal reflux disease)   . Hemoptysis   . Hyperlipidemia   . Rhinitis     Past Surgical History:  Procedure Laterality Date  . APPENDECTOMY    . BACK SURGERY    . Bladder prolapse repair  11/08  . COLONOSCOPY    . UPPER GASTROINTESTINAL ENDOSCOPY    . VESICOVAGINAL FISTULA CLOSURE W/ TAH  1989    Prior to Admission medications   Medication Sig Start Date End Date Taking? Authorizing Provider  albuterol (VENTOLIN HFA) 108 (90 Base) MCG/ACT inhaler Inhale 2 puffs into the lungs every 4 (four) hours as needed. 03/29/19   [provider]  ALPRAZolam (XDuanne Moron0.5 MG tablet TAKE 1 TABLET BY MOUTH EVERY 6 HOURS AS NEEDED 07/16/12   WeTanda Rockers  MD  Ascorbic Acid (VITAMIN C) 500 MG tablet Take 500 mg by mouth daily.      [provider]  aspirin EC 81 MG tablet Take 81 mg by mouth daily.    [provider]  atorvastatin (LIPITOR) 40 MG tablet Take 40 mg by mouth daily at 6 PM.    [provider]  Calcium Carbonate-Vitamin D (CALCIUM PLUS VITAMIN D PO) Take 1 capsule by mouth 2 (two) times daily.      [provider]  escitalopram (LEXAPRO) 10 MG tablet escitalopram 10 mg tablet    [provider]  estradiol  (VIVELLE-DOT) 0.1 MG/24HR Place 1 patch onto the skin 2 (two) times a week.      [provider]  fluconazole (DIFLUCAN) 150 MG tablet TAKE 1 TABLET BY MOUTH EVERY DAY AS A SINGLE DOSE, MAY REPEAT IN 3 5 DAYS AS NEEDED 03/30/19   [provider]  fluticasone (FLONASE) 50 MCG/ACT nasal spray fluticasone propionate 50 mcg/actuation nasal spray,suspension    [provider]  hydrocortisone (ANUSOL-HC) 2.5 % rectal cream Place 1 application rectally at bedtime. As directed Patient not taking: Reported on 05/27/2019 04/21/19   Irene Shipper, MD  meloxicam (MOBIC) 15 MG tablet Take 1 tablet (15 mg total) by mouth daily. 03/14/20   Edrick Kins, DPM  mometasone (NASONEX) 50 MCG/ACT nasal spray Place 2 sprays into the nose daily. 0-2 puffs each nostril every morning and 1-2 puffs at bedtime     Tanda Rockers, MD  montelukast (SINGULAIR) 10 MG tablet Take 10 mg by mouth at bedtime.   08/19/10 05/27/19  Tanda Rockers, MD  Multiple Vitamin (MULTIVITAMIN) capsule Take 1 capsule by mouth daily.      [provider]  omeprazole (PRILOSEC) 20 MG capsule TAKE 1 CAPSULE BY MOUTH 30 - 60 MINUTES BEFORE THE FIRST BIG MEAL OF THE DAY 07/17/12   Tanda Rockers, MD  oxybutynin (DITROPAN-XL) 10 MG 24 hr tablet Take 5 mg by mouth at bedtime.     [provider]  polyethylene glycol (MIRALAX / GLYCOLAX) packet Take 17 g by mouth daily.     [provider]  SYMBICORT 80-4.5 MCG/ACT inhaler Inhale 2 puffs into the lungs 2 (two) times daily. 11/22/18   [provider]  TOVIAZ 8 MG TB24 tablet Take 8 mg by mouth daily. 02/25/19   [provider]  valACYclovir (VALTREX) 500 MG tablet Take 1,000 mg by mouth daily as needed (outbreak). 2 every day as needed     [provider]  VYTORIN 10-80 MG per tablet TAKE 1 TABLET BY MOUTH DAILY AT BEDTIME 07/28/12   Tanda Rockers, MD  Zoster Vaccine Adjuvanted [facility]) injection Shingrix (PF) 50 mcg/0.5 mL  intramuscular suspension, kit  TO BE ADMINISTERED BY PHARMACIST FOR IMMUNIZATION    [provider]  diphenhydramine-acetaminophen (TYLENOL PM) 25-500 MG TABS Take 1 tablet by mouth at bedtime as needed.    07/11/11  [provider]    Current Facility-Administered Medications  Medication Dose Route Frequency Provider Last Rate Last Admin  . 0.9 %  sodium chloride infusion  500 mL Intravenous Continuous Irene Shipper, MD       Current Outpatient Medications  Medication Sig Dispense Refill  . albuterol (VENTOLIN HFA) 108 (90 Base) MCG/ACT inhaler Inhale 2 puffs into the lungs every 4 (four) hours as needed.    . ALPRAZolam (XANAX) 0.5 MG tablet TAKE 1 TABLET BY MOUTH EVERY 6  HOURS AS NEEDED 90 tablet 0  . Ascorbic Acid (VITAMIN C) 500 MG tablet Take 500 mg by mouth daily.      Marland Kitchen aspirin EC 81 MG tablet Take 81 mg by mouth daily.    Marland Kitchen atorvastatin (LIPITOR) 40 MG tablet Take 40 mg by mouth daily at 6 PM.    . Calcium Carbonate-Vitamin D (CALCIUM PLUS VITAMIN D PO) Take 1 capsule by mouth 2 (two) times daily.      Marland Kitchen escitalopram (LEXAPRO) 10 MG tablet escitalopram 10 mg tablet    . estradiol (VIVELLE-DOT) 0.1 MG/24HR Place 1 patch onto the skin 2 (two) times a week.      . fluconazole (DIFLUCAN) 150 MG tablet TAKE 1 TABLET BY MOUTH EVERY DAY AS A SINGLE DOSE, MAY REPEAT IN 3 5 DAYS AS NEEDED    . fluticasone (FLONASE) 50 MCG/ACT nasal spray fluticasone propionate 50 mcg/actuation nasal spray,suspension    . hydrocortisone (ANUSOL-HC) 2.5 % rectal cream Place 1 application rectally at bedtime. As directed (Patient not taking: Reported on 05/27/2019) 30 g 1  . meloxicam (MOBIC) 15 MG tablet Take 1 tablet (15 mg total) by mouth daily. 90 tablet 0  . mometasone (NASONEX) 50 MCG/ACT nasal spray Place 2 sprays into the nose daily. 0-2 puffs each nostril every morning and 1-2 puffs at bedtime     . montelukast (SINGULAIR) 10 MG tablet Take 10 mg by mouth at bedtime.      . Multiple  Vitamin (MULTIVITAMIN) capsule Take 1 capsule by mouth daily.      Marland Kitchen omeprazole (PRILOSEC) 20 MG capsule TAKE 1 CAPSULE BY MOUTH 30 - 60 MINUTES BEFORE THE FIRST BIG MEAL OF THE DAY 30 capsule 11  . oxybutynin (DITROPAN-XL) 10 MG 24 hr tablet Take 5 mg by mouth at bedtime.     . polyethylene glycol (MIRALAX / GLYCOLAX) packet Take 17 g by mouth daily.     . SYMBICORT 80-4.5 MCG/ACT inhaler Inhale 2 puffs into the lungs 2 (two) times daily.    . TOVIAZ 8 MG TB24 tablet Take 8 mg by mouth daily.    . valACYclovir (VALTREX) 500 MG tablet Take 1,000 mg by mouth daily as needed (outbreak). 2 every day as needed     . VYTORIN 10-80 MG per tablet TAKE 1 TABLET BY MOUTH DAILY AT BEDTIME 30 tablet 5  . Zoster Vaccine Adjuvanted [facility]) injection Shingrix (PF) 50 mcg/0.5 mL intramuscular suspension, kit  TO BE ADMINISTERED BY PHARMACIST FOR IMMUNIZATION      Allergies as of 07/20/2020  . (No Known Allergies)    Family History  Problem Relation Age of Onset  . Coronary artery disease Father   . Emphysema Mother   . Osteoporosis Mother   . Stomach cancer Neg Hx   . Colon cancer Neg Hx   . Esophageal cancer Neg Hx   . Rectal cancer Neg Hx     Social History   Socioeconomic History  . Marital status: Married    Spouse name: Not on file  . Number of children: Not on file  . Years of education: Not on file  . Highest education level: Not on file  Occupational History  . Not on file  Tobacco Use  . Smoking status: Former Smoker    Packs/day: 1.50    Years: 35.00    Pack years: 52.50    Types: Cigarettes    Quit date: 01/03/2010    Years since quitting: 10.5  . Smokeless tobacco: Never  Used  Vaping Use  . Vaping Use: Never used  Substance and Sexual Activity  . Alcohol use: No    Comment: occ  . Drug use: No  . Sexual activity: Not on file  Other Topics Concern  . Not on file  Social History Narrative  . Not on file   Social Determinants of Health   Financial Resource  Strain: Not on file  Food Insecurity: Not on file  Transportation Needs: Not on file  Physical Activity: Not on file  Stress: Not on file  Social Connections: Not on file  Intimate Partner Violence: Not on file    Review of Systems: Gen: Denies fever, sweats or chills. No weight loss.  CV: Denies chest pain, palpitations or edema. Resp: Denies cough, shortness of breath of hemoptysis.  GI: See HPI.    GU : + vaginal candidiasis symptoms.  MS: Denies joint pain, muscles aches or weakness. Derm: Denies rash, itchiness, skin lesions or unhealing ulcers. Psych: Denies depression, anxiety or memory loss.  Heme: Denies easy bruising, bleeding. Neuro:  Denies headaches, dizziness or paresthesias. Endo:  Denies any problems with DM, thyroid or adrenal function.  Physical Exam: Vital signs in last 24 hours: Temp:  [97.9 F (36.6 C)] 97.9 F (36.6 C) (03/18 1224) Pulse Rate:  [105] 105 (03/18 1224) Resp:  [16] 16 (03/18 1224) BP: (111)/(78) 111/78 (03/18 1224) SpO2:  [100 %] 100 % (03/18 1224) Weight:  [71 kg] 71 kg (03/18 1226)   General:  Alert 65 year old female in NAD.  Head:  Normocephalic and atraumatic. Eyes:  No scleral icterus. Conjunctiva pink. Ears:  Normal auditory acuity. Nose:  No deformity, discharge or lesions. Mouth:  Dentition intact. No ulcers or lesions.  Neck:  Supple. No lymphadenopathy or thyromegaly.  Lungs:  Breath sounds clear throughout.  Heart:  RRR, no murmurs.  Abdomen:  Soft, nondistended. Nontender with + BS x 4 quads.  Rectal: Deferred. Musculoskeletal:  Symmetrical without gross deformities.  Pulses:  Normal pulses noted. Extremities:  Without clubbing or edema. Neurologic:  Alert and  oriented x4. No focal deficits.  Skin:  Intact without significant lesions or rashes. Psych:  Alert and cooperative. Normal mood and affect.  Intake/Output from previous day: No intake/output data recorded. Intake/Output this shift: No intake/output data  recorded.  Lab Results: Recent Labs    07/20/20 1228  WBC 8.0  HGB 10.9*  HCT 32.9*  PLT 246   BMET Recent Labs    07/20/20 1228  NA 139  K 4.1  CL 105  CO2 21*  GLUCOSE 124*  BUN 19  CREATININE 0.73  CALCIUM 8.9   LFT Recent Labs    07/20/20 1228  PROT 6.5  ALBUMIN 4.0  AST 24  ALT 19  ALKPHOS 70  BILITOT 0.7   PT/INR No results for input(s): LABPROT, INR in the last 72 hours. Hepatitis Panel No results for input(s): HEPBSAG, HCVAB, HEPAIGM, HEPBIGM in the last 72 hours.   Studies/Results: No results found.  IMPRESSION/PLAN:  53. 65 year old female with lower abdominal pain with hematochezia, suspect diverticular bleed. Hg 10.9 (baseline Hg 12.1). BUN 19.  -Clear liquid diet -H/H Q 6 hours x 24 hours  -BMP in AM -Recommend abd/pelvic CT angiogram if she develops brisk bleeding  -IV fluids per the hospitalist  -Further recommendations per Dr. Tarri Glenn   2. History of tubular adenomatous colon polyps  3. History of GERD, no current symptoms. On ASA and Meloxicam.  Patrecia Pour Kennedy-Smith  07/20/2020, 3:55 PM

## 2020-07-20 NOTE — Telephone Encounter (Signed)
Multiple prior colonoscopies, most recently January 2021.  She has pandiverticulosis.  Agree with proceeding to ER for evaluation.

## 2020-07-20 NOTE — H&P (Signed)
History and Physical    [name] CVE:[id number] DOB: June 07, 1955 DOA: 07/20/2020  PCP: Jonathon Jordan, MD  Patient coming from: Home  Chief Complaint: rectal bleeding  HPI: [name] is a 64 y.o. female with medical history significant of HLD, GERD, arthritis. Presenting with rectal bleeding. Pt reports that this morning she had a bowel movement and noticed that it was heavy with bright red blood. She felt cramping along the lower quadrants of her abdomen shortly after. The cramps whether intermittent and of varying strengths from mild to moderate. She thought about what her GI doc recommended in the past, and took an anusol suppository to help. She continued to have multiple bloody bowel movements last night and this morning. She called her GI doc to get recommendations. She was giving instructions that included reporting to the ED if she had any additional symptoms. She decided to get up an take a shower after these recommendations. When she was in the shower, she became lightheaded and almost passed out. She decided it was time to come to the hospital. She denies any other aggravating or alleviating factors.  ED Course: She was noted to be lightheaded and somewhat weak. Her Hgb was 10.9. [facility] was consulted. [facility] was called for admission.   Review of Systems:  Denies CP, palpitations, dyspnea, N/V/D/F, hematemesis. Review of systems is otherwise negative for all not mentioned in HPI.   PMHx Past Medical History:  Diagnosis Date  . Anxiety   . Arthritis   . Bronchitis   . Cancer ([place])    basal cell  . Colon polyp   . COPD (chronic obstructive pulmonary disease) ([place])   . GERD (gastroesophageal reflux disease)   . Hemoptysis   . Hyperlipidemia   . Rhinitis     PSHx Past Surgical History:  Procedure Laterality Date  . APPENDECTOMY    . BACK SURGERY    . Bladder prolapse repair  11/08  . COLONOSCOPY    . UPPER GASTROINTESTINAL ENDOSCOPY    . VESICOVAGINAL FISTULA  CLOSURE W/ TAH  1989    SocHx  reports that she quit smoking about 10 years ago. Her smoking use included cigarettes. She has a 52.50 pack-year smoking history. She has never used smokeless tobacco. She reports that she does not drink alcohol and does not use drugs.  No Known Allergies  FamHx Family History  Problem Relation Age of Onset  . Coronary artery disease Father   . Emphysema Mother   . Osteoporosis Mother   . Stomach cancer Neg Hx   . Colon cancer Neg Hx   . Esophageal cancer Neg Hx   . Rectal cancer Neg Hx     Prior to Admission medications   Medication Sig Start Date End Date Taking? Authorizing Provider  albuterol (VENTOLIN HFA) 108 (90 Base) MCG/ACT inhaler Inhale 2 puffs into the lungs every 4 (four) hours as needed. 03/29/19   [provider]  ALPRAZolam Duanne Moron) 0.5 MG tablet TAKE 1 TABLET BY MOUTH EVERY 6 HOURS AS NEEDED 07/16/12   Tanda Rockers, MD  Ascorbic Acid (VITAMIN C) 500 MG tablet Take 500 mg by mouth daily.      [provider]  aspirin EC 81 MG tablet Take 81 mg by mouth daily.    [provider]  atorvastatin (LIPITOR) 40 MG tablet Take 40 mg by mouth daily at 6 PM.    [provider]  Calcium Carbonate-Vitamin D (CALCIUM PLUS VITAMIN D PO) Take 1 capsule  by mouth 2 (two) times daily.      [provider]  escitalopram (LEXAPRO) 10 MG tablet escitalopram 10 mg tablet    [provider]  estradiol (VIVELLE-DOT) 0.1 MG/24HR Place 1 patch onto the skin 2 (two) times a week.      [provider]  fluconazole (DIFLUCAN) 150 MG tablet TAKE 1 TABLET BY MOUTH EVERY DAY AS A SINGLE DOSE, MAY REPEAT IN 3 5 DAYS AS NEEDED 03/30/19   [provider]  fluticasone (FLONASE) 50 MCG/ACT nasal spray fluticasone propionate 50 mcg/actuation nasal spray,suspension    [provider]  hydrocortisone (ANUSOL-HC) 2.5 % rectal cream Place 1 application rectally at bedtime. As directed Patient not  taking: Reported on 05/27/2019 04/21/19   Irene Shipper, MD  meloxicam (MOBIC) 15 MG tablet Take 1 tablet (15 mg total) by mouth daily. 03/14/20   Edrick Kins, DPM  mometasone (NASONEX) 50 MCG/ACT nasal spray Place 2 sprays into the nose daily. 0-2 puffs each nostril every morning and 1-2 puffs at bedtime     Tanda Rockers, MD  montelukast (SINGULAIR) 10 MG tablet Take 10 mg by mouth at bedtime.   08/19/10 05/27/19  Tanda Rockers, MD  Multiple Vitamin (MULTIVITAMIN) capsule Take 1 capsule by mouth daily.      [provider]  omeprazole (PRILOSEC) 20 MG capsule TAKE 1 CAPSULE BY MOUTH 30 - 60 MINUTES BEFORE THE FIRST BIG MEAL OF THE DAY 07/17/12   Tanda Rockers, MD  oxybutynin (DITROPAN-XL) 10 MG 24 hr tablet Take 5 mg by mouth at bedtime.     [provider]  polyethylene glycol (MIRALAX / GLYCOLAX) packet Take 17 g by mouth daily.     [provider]  SYMBICORT 80-4.5 MCG/ACT inhaler Inhale 2 puffs into the lungs 2 (two) times daily. 11/22/18   [provider]  TOVIAZ 8 MG TB24 tablet Take 8 mg by mouth daily. 02/25/19   [provider]  valACYclovir (VALTREX) 500 MG tablet Take 1,000 mg by mouth daily as needed (outbreak). 2 every day as needed     [provider]  VYTORIN 10-80 MG per tablet TAKE 1 TABLET BY MOUTH DAILY AT BEDTIME 07/28/12   Tanda Rockers, MD  Zoster Vaccine Adjuvanted [facility]) injection Shingrix (PF) 50 mcg/0.5 mL intramuscular suspension, kit  TO BE ADMINISTERED BY PHARMACIST FOR IMMUNIZATION    [provider]  diphenhydramine-acetaminophen (TYLENOL PM) 25-500 MG TABS Take 1 tablet by mouth at bedtime as needed.    07/11/11  [provider]    Physical Exam: Vitals:   07/20/20 1224 07/20/20 1226 07/20/20 1601  BP: 111/78  (!) 123/56  Pulse: (!) 105  77  Resp: 16  20  Temp: 97.9 F (36.6 C)    TempSrc: Oral    SpO2: 100%  100%  Weight:  71 kg   Height:  $Remove'5\' 2"'NiZEScW$  (1.575 m)     General: 66  y.o. female resting in bed in NAD Eyes: PERRL, normal sclera ENMT: Nares patent w/o discharge, orophaynx clear, dentition normal, ears w/o discharge/lesions/ulcers Neck: Supple, trachea midline Cardiovascular: RRR, +S1, S2, no m/g/r, equal pulses throughout Respiratory: CTABL, no w/r/r, normal WOB GI: BS+, ND, soft, NT, no masses noted, no organomegaly noted MSK: No e/c/c Skin: No rashes, bruises, ulcerations noted Neuro: A&O x 3, no focal deficits Psyc: Appropriate interaction and affect, calm/cooperative  Labs on Admission: I have personally reviewed following labs and imaging studies  CBC: Recent  Labs  Lab 07/20/20 1228  WBC 8.0  HGB 10.9*  HCT 32.9*  MCV 92.2  PLT 338   Basic Metabolic Panel: Recent Labs  Lab 07/20/20 1228  NA 139  K 4.1  CL 105  CO2 21*  GLUCOSE 124*  BUN 19  CREATININE 0.73  CALCIUM 8.9   GFR: Estimated Creatinine Clearance: 63 mL/min (by C-G formula based on SCr of 0.73 mg/dL). Liver Function Tests: Recent Labs  Lab 07/20/20 1228  AST 24  ALT 19  ALKPHOS 70  BILITOT 0.7  PROT 6.5  ALBUMIN 4.0   No results for input(s): LIPASE, AMYLASE in the last 168 hours. No results for input(s): AMMONIA in the last 168 hours. Coagulation Profile: No results for input(s): INR, PROTIME in the last 168 hours. Cardiac Enzymes: No results for input(s): CKTOTAL, CKMB, CKMBINDEX, TROPONINI in the last 168 hours. BNP (last 3 results) No results for input(s): PROBNP in the last 8760 hours. HbA1C: No results for input(s): HGBA1C in the last 72 hours. CBG: No results for input(s): GLUCAP in the last 168 hours. Lipid Profile: No results for input(s): CHOL, HDL, LDLCALC, TRIG, CHOLHDL, LDLDIRECT in the last 72 hours. Thyroid Function Tests: No results for input(s): TSH, T4TOTAL, FREET4, T3FREE, THYROIDAB in the last 72 hours. Anemia Panel: No results for input(s): VITAMINB12, FOLATE, FERRITIN, TIBC, IRON, RETICCTPCT in the last 72 hours. Urine  analysis:    Component Value Date/Time   COLORURINE YELLOW 05/24/2016 0834   APPEARANCEUR HAZY (A) 05/24/2016 0834   LABSPEC 1.026 05/24/2016 0834   PHURINE 5.0 05/24/2016 0834   GLUCOSEU 50 (A) 05/24/2016 0834   GLUCOSEU NEGATIVE 07/11/2011 0937   HGBUR NEGATIVE 05/24/2016 0834   BILIRUBINUR NEGATIVE 05/24/2016 0834   KETONESUR 5 (A) 05/24/2016 0834   PROTEINUR 30 (A) 05/24/2016 0834   UROBILINOGEN 0.2 07/11/2011 0937   NITRITE NEGATIVE 05/24/2016 0834   LEUKOCYTESUR NEGATIVE 05/24/2016 0834    Radiological Exams on Admission: No results found.  EKG: Independently reviewed. NSR, no st changes  Assessment/Plan GIB     - admit to inpt, progressive     - she reports 6 large volume rectal bleeds; although Hgb is 10.9, it may take a bit for it to catch up to her blood loss     - will follow q6h H&H     - transfuse for Hgb < 7     - [facility] onboard, appreciate their assistance, will follow their recs  HLD     - resume home regimen was med rec complete  Anxiety     - resume home regimen once med rec complete  COPD     - resume home regimen once med rec complete  DVT prophylaxis: SCDs  Code Status: FULL  Family Communication: None at bedside  Consults called: [facility] (Dr. Tarri Glenn)   Status is: Inpatient  Remains inpatient appropriate because:Inpatient level of care appropriate due to severity of illness   Dispo: The patient is from: Home              Anticipated d/c is to: Home              Patient currently is not medically stable to d/c.   Difficult to place patient No  Jonnie Finner DO [facility]  If 7PM-7AM, please contact night-coverage [email or web address]  07/20/2020, 4:05 PM

## 2020-07-20 NOTE — ED Notes (Signed)
Called transport to take patient to 16

## 2020-07-20 NOTE — Telephone Encounter (Signed)
Patient reports that she is having rectal bleeding and severe cramping.  She has had 4 episodes of rectal bleeding independent of stool.  Large amount of blood that was "red", not dark or bright red.  She reports that last episode a few hours ago, was not as large as the first 3.  She was "dizzy"  earlier and has been in the bed since.  She has lower abdominal cramping when she gets up and moves around.  She is advised she needs to go the ED for eval.  She is advised to not drive herself.  She is very reluctant to go to the ED.  She wants to wait at home.  I reviewed she should go to the ED, but if going to remain at home needs to remain on a clear liquid diet and rest.  She agrees if she has any more bleeding she will proceed to the ED.

## 2020-07-20 NOTE — ED Notes (Signed)
Patient to restroom.

## 2020-07-20 NOTE — ED Notes (Signed)
MD in room at this time.

## 2020-07-20 NOTE — ED Triage Notes (Signed)
Since yesterday patient has had multiple episodes of bright red blood stools per her rectum, states after the last episode she got weak / dizzy. Consulted her PCP, told her to come to the ED. Has hx of polyps.

## 2020-07-20 NOTE — ED Provider Notes (Signed)
[facility] DEPT Provider Note   CSN: [id number] Arrival date & time: 07/20/20  1214     History No chief complaint on file.   [name] is a 65 y.o. female.  6 large-volume bright red blood per rectum.  Feeling symptoms of lightheadedness fatigue.  Intermittent diaphoresis.  Denies chest pain headache shortness of breath.  Has had issues with polyps and diverticulosis in the past.  Was instructed by gastroenterology to start clear diet today and come to the emergency room.  She denies abdominal pain she denies fevers chills sick contacts.        Past Medical History:  Diagnosis Date  . Anxiety   . Arthritis   . Bronchitis   . 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 Colon polyp   . COPD (chronic obstructive pulmonary disease) ([place])   . GERD (gastroesophageal reflux disease)   . Hemoptysis   . Hyperlipidemia   . Rhinitis   . Symptomatic anemia     Patient Active Problem List   Diagnosis Date Noted  . Symptomatic anemia   . BRBPR (bright red blood per rectum) 07/20/2020  . SBO (small bowel obstruction) ([place]) 05/24/2016  . Age-related facial wrinkles 09/06/2015  . Constipation 05/03/2015  . Incontinence 05/03/2015  . GERD (gastroesophageal reflux disease) 11/13/2011  . Osteopenia 10/07/2010  . COPD (chronic obstructive pulmonary disease) ([place]) 10/02/2009  . SLEEP DISORDER UNSPECIFIED 01/16/2009  . Radiculopathy of lumbosacral region 09/21/2007  . POLYP, COLON 03/04/2007  . HLD (hyperlipidemia) 03/04/2007  . RHINITIS, CHRONIC 03/04/2007  . GERD 03/04/2007    Past Surgical History:  Procedure Laterality Date  . APPENDECTOMY    . BACK SURGERY    . Bladder prolapse repair  11/08  . COLONOSCOPY    . UPPER GASTROINTESTINAL ENDOSCOPY    . VESICOVAGINAL FISTULA CLOSURE W/ TAH  1989     OB History   No obstetric history on file.     Family History  Problem Relation Age of Onset  . Coronary artery disease Father   . Emphysema  Mother   . Osteoporosis Mother   . Stomach cancer Neg Hx   . Colon cancer Neg Hx   . Esophageal cancer Neg Hx   . Rectal cancer Neg Hx     Social History   Tobacco Use  . Smoking status: Former Smoker    Packs/day: 1.50    Years: 35.00    Pack years: 52.50    Types: Cigarettes    Quit date: 01/03/2010    Years since quitting: 10.5  . Smokeless tobacco: Never Used  . Tobacco comment: 07/20/20 - pt chews nicorette gum  Vaping Use  . Vaping Use: Never used  Substance Use Topics  . Alcohol use: No    Comment: occ  . Drug use: No    Home Medications Prior to Admission medications   Medication Sig Start Date End Date Taking? Authorizing Provider  albuterol (VENTOLIN HFA) 108 (90 Base) MCG/ACT inhaler Inhale 2 puffs into the lungs every 4 (four) hours as needed for wheezing or shortness of breath. 03/29/19  Yes [provider]  ALPRAZolam (XANAX) 0.5 MG tablet TAKE 1 TABLET BY MOUTH EVERY 6 HOURS AS NEEDED Patient taking differently: Take 0.5 mg by mouth at bedtime. 07/16/12  Yes Tanda Rockers, MD  Ascorbic Acid (VITAMIN C) 500 MG tablet Take 500 mg by mouth daily.   Yes [provider]  aspirin EC 81 MG tablet Take 81  mg by mouth daily.   Yes [provider]  atorvastatin (LIPITOR) 40 MG tablet Take 40 mg by mouth daily.   Yes [provider]  Calcium Carbonate-Vitamin D (CALCIUM PLUS VITAMIN D PO) Take 1 capsule by mouth 2 (two) times daily.   Yes [provider]  cholecalciferol (VITAMIN D3) 25 MCG (1000 UNIT) tablet Take 1,000 Units by mouth daily.   Yes [provider]  escitalopram (LEXAPRO) 20 MG tablet Take 20 mg by mouth daily.   Yes [provider]  estradiol (VIVELLE-DOT) 0.1 MG/24HR Place 1 patch onto the skin 2 (two) times a week.   Yes [provider]  fluconazole (DIFLUCAN) 150 MG tablet Take 150 mg by mouth once. 03/30/19  Yes [provider]  fluticasone (FLONASE) 50 MCG/ACT nasal spray  Place 1 spray into both nostrils daily.   Yes [provider]  fluticasone (FLOVENT HFA) 110 MCG/ACT inhaler Inhale 2 puffs into the lungs daily at 6 (six) AM.   Yes [provider]  hydrocortisone (ANUSOL-HC) 2.5 % rectal cream Place 1 application rectally at bedtime. As directed Patient taking differently: Place 1 application rectally daily as needed for hemorrhoids. As directed 04/21/19  Yes Irene Shipper, MD  meloxicam (MOBIC) 15 MG tablet Take 1 tablet (15 mg total) by mouth daily. 03/14/20  Yes Edrick Kins, DPM  montelukast (SINGULAIR) 10 MG tablet Take 10 mg by mouth at bedtime. 08/19/10 07/20/20 Yes Tanda Rockers, MD  Multiple Vitamin (MULTIVITAMIN) capsule Take 1 capsule by mouth daily.   Yes [provider]  omeprazole (PRILOSEC) 20 MG capsule TAKE 1 CAPSULE BY MOUTH 30 - 60 MINUTES BEFORE THE FIRST BIG MEAL OF THE DAY Patient taking differently: Take 20 mg by mouth daily. 07/17/12  Yes Tanda Rockers, MD  oxybutynin (DITROPAN) 5 MG tablet Take 5 mg by mouth daily.   Yes [provider]  polyethylene glycol (MIRALAX / GLYCOLAX) packet Take 17 g by mouth daily.   Yes [provider]  valACYclovir (VALTREX) 500 MG tablet Take 500 mg by mouth daily.   Yes [provider]  zinc gluconate 50 MG tablet Take 50 mg by mouth daily.   Yes [provider]  VYTORIN 10-80 MG per tablet TAKE 1 TABLET BY MOUTH DAILY AT BEDTIME Patient not taking: Reported on 07/20/2020 07/28/12   Tanda Rockers, MD  diphenhydramine-acetaminophen (TYLENOL PM) 25-500 MG TABS Take 1 tablet by mouth at bedtime as needed.    07/11/11  [provider]    Allergies    Celecoxib  Review of Systems   Review of Systems  Constitutional: Negative for chills and fever.  HENT: Negative for congestion and rhinorrhea.   Respiratory: Negative for cough and shortness of breath.   Cardiovascular: Negative for chest pain and palpitations.  Gastrointestinal:  Positive for anal bleeding and blood in stool. Negative for diarrhea, nausea and vomiting.  Genitourinary: Negative for difficulty urinating and dysuria.  Musculoskeletal: Negative for arthralgias and back pain.  Skin: Negative for rash and wound.  Neurological: Positive for syncope and light-headedness. Negative for headaches.    Physical Exam Updated Vital Signs BP 103/65 (BP Location: Right Arm)   Pulse 67   Temp 98.2 F (36.8 C) (Oral)   Resp 18   Ht 5\' 2"  (1.575 m)   Wt 70.3 kg   SpO2 97%   BMI 28.35 kg/m   Physical Exam Vitals and nursing note reviewed. Exam conducted with a chaperone present.  Constitutional:      General: She is not in acute distress.    Appearance: Normal appearance.  HENT:     Head: Normocephalic and atraumatic.     Nose: No rhinorrhea.     Mouth/Throat:     Mouth: Mucous membranes are moist.  Eyes:     General:        Right eye: No discharge.        Left eye: No discharge.     Conjunctiva/sclera: Conjunctivae normal.  Cardiovascular:     Rate and Rhythm: Normal rate and regular rhythm.     Heart sounds: No murmur heard.   Pulmonary:     Effort: Pulmonary effort is normal. No respiratory distress.     Breath sounds: No stridor. No wheezing.  Abdominal:     General: Abdomen is flat. There is no distension.     Palpations: Abdomen is soft.     Tenderness: There is no abdominal tenderness. There is no guarding.  Musculoskeletal:        General: No tenderness or signs of injury.  Skin:    General: Skin is warm and dry.  Neurological:     General: No focal deficit present.     Mental Status: She is alert. Mental status is at baseline.     Motor: No weakness.  Psychiatric:        Mood and Affect: Mood normal.        Behavior: Behavior normal.     ED Results / Procedures / Treatments   Labs (all labs ordered are listed, but only abnormal results are displayed) Labs Reviewed  COMPREHENSIVE METABOLIC PANEL - Abnormal; Notable for the  following components:      Result Value   CO2 21 (*)    Glucose, Bld 124 (*)    All other components within normal limits  CBC - Abnormal; Notable for the following components:   RBC 3.57 (*)    Hemoglobin 10.9 (*)    HCT 32.9 (*)    All other components within normal limits  HEMOGLOBIN AND HEMATOCRIT, BLOOD - Abnormal; Notable for the following components:   Hemoglobin 10.4 (*)    HCT 32.1 (*)    All other components within normal limits  CBC - Abnormal; Notable for the following components:   RBC 2.92 (*)    Hemoglobin 8.8 (*)    HCT 27.5 (*)    All other components within normal limits  COMPREHENSIVE METABOLIC PANEL - Abnormal; Notable for the following components:   Calcium 8.3 (*)    Total Protein 5.2 (*)    Albumin 3.2 (*)    All other components within normal limits  HEMOGLOBIN AND HEMATOCRIT, BLOOD - Abnormal; Notable for the following components:   Hemoglobin 10.3 (*)    HCT 31.8 (*)    All other components within normal limits  HEMOGLOBIN AND HEMATOCRIT, BLOOD - Abnormal; Notable for the following components:   Hemoglobin 9.2 (*)    HCT 28.5 (*)    All other components within normal limits  RESP PANEL BY RT-PCR (FLU A&B, COVID) ARPGX2  HIV ANTIBODY (ROUTINE TESTING W REFLEX)  CBC  BASIC METABOLIC PANEL  TYPE AND SCREEN  ABO/RH    EKG EKG Interpretation  Date/Time:  Friday July 20 2020 16:04:32 EDT Ventricular Rate:  79 PR Interval:    QRS Duration: 87 QT Interval:  418 QTC Calculation: 480 R Axis:   68 Text Interpretation: Sinus rhythm Borderline T wave abnormalities Confirmed by  Octaviano Glow ([id number]) on 07/21/2020 1:55:29 PM   Radiology No results found.  Procedures .Critical Care E&M Performed by: Breck Coons, MD  Critical care provider statement:    Critical care time (minutes):  35   Critical care was necessary to treat or prevent imminent or life-threatening deterioration of the following conditions:  Circulatory failure (GI bleed)    Critical care was time spent personally by me on the following activities:  Blood draw for specimens, development of treatment plan with patient or surrogate, discussions with consultants, evaluation of patient's response to treatment, examination of patient, obtaining history from patient or surrogate, ordering and performing treatments and interventions, ordering and review of laboratory studies, re-evaluation of patient's condition and review of old charts   Care discussed with: admitting provider   After initial E/M assessment, critical care services were subsequently performed that were exclusive of separately billable procedures or treatment.       Medications Ordered in ED Medications  acetaminophen (TYLENOL) tablet 650 mg (650 mg Oral Given 07/21/20 2014)    Or  acetaminophen (TYLENOL) suppository 650 mg ( Rectal See Alternative 07/21/20 2014)  ondansetron (ZOFRAN) tablet 4 mg (has no administration in time range)    Or  ondansetron (ZOFRAN) injection 4 mg (has no administration in time range)  valACYclovir (VALTREX) tablet 500 mg (0 mg Oral Duplicate 4/31/54 0086)  atorvastatin (LIPITOR) tablet 40 mg (0 mg Oral Duplicate 7/61/95 0932)  ALPRAZolam Duanne Moron) tablet 0.5 mg (0.5 mg Oral Given 07/21/20 2013)  escitalopram (LEXAPRO) tablet 20 mg (0 mg Oral Duplicate 6/71/24 5809)  oxybutynin (DITROPAN) tablet 5 mg (0 mg Oral Duplicate 9/83/38 2505)  ascorbic acid (VITAMIN C) tablet 500 mg (0 mg Oral Duplicate 3/97/67 3419)  cholecalciferol (VITAMIN D3) tablet 1,000 Units (0 Units Oral Duplicate 3/79/02 4097)  albuterol (VENTOLIN HFA) 108 (90 Base) MCG/ACT inhaler 2 puff (has no administration in time range)  fluticasone (FLONASE) 50 MCG/ACT nasal spray 1 spray (0 sprays Each Nare Duplicate 3/53/29 9242)  budesonide (PULMICORT) nebulizer solution 0.25 mg (0.25 mg Nebulization Given 07/21/20 1944)  montelukast (SINGULAIR) tablet 10 mg (10 mg Oral Given 07/21/20 2013)  multivitamin with minerals  tablet 1 tablet (0 tablets Oral Duplicate 6/83/41 9622)  zinc sulfate capsule 220 mg (0 mg Oral Duplicate 2/97/98 9211)  0.9 %  sodium chloride infusion ( Intravenous New Bag/Given 07/20/20 1833)    ED Course  I have reviewed the triage vital signs and the nursing notes.  Pertinent labs & imaging results that were available during my care of the patient were reviewed by me and considered in my medical decision making (see chart for details).    MDM Rules/Calculators/A&P                          BRBPR, hx of polyps and diverticulum, symptoms of anemia, hemoglobin 10.9 down from baseline of around 11.  Patient is tachycardic.  High risk for GI bleed.  Sent secure message to gastroenterology consult medicine for admission.  Screening EKG will be done as well.  Patient remains hemodynamically stable.  EKG shows sinus rhythm with no acute ischemic change interval abnormality or arrhythmia.  Medicine and gastroenterology consulted for lower GI bleed.  She will be admitted to their service.  CRITICAL CARE Performed by: Breck Coons   Total critical care time: 35 minutes  Critical care time was exclusive of separately billable procedures and treating other patients.  Critical care was necessary  to treat or prevent imminent or life-threatening deterioration.  Critical care was time spent personally by me on the following activities: development of treatment plan with patient and/or surrogate as well as nursing, discussions with consultants, evaluation of patient's response to treatment, examination of patient, obtaining history from patient or surrogate, ordering and performing treatments and interventions, ordering and review of laboratory studies, ordering and review of radiographic studies, pulse oximetry and re-evaluation of patient's condition.  Final Clinical Impression(s) / ED Diagnoses Final diagnoses:  BRBPR (bright red blood per rectum)  Symptomatic anemia    Rx / DC Orders ED  Discharge Orders    None       Breck Coons, MD 07/22/20 [phone]

## 2020-07-21 ENCOUNTER — Encounter (HOSPITAL_COMMUNITY): Payer: Self-pay | Admitting: Internal Medicine

## 2020-07-21 DIAGNOSIS — K5791 Diverticulosis of intestine, part unspecified, without perforation or abscess with bleeding: Secondary | ICD-10-CM

## 2020-07-21 DIAGNOSIS — D649 Anemia, unspecified: Secondary | ICD-10-CM

## 2020-07-21 DIAGNOSIS — K625 Hemorrhage of anus and rectum: Secondary | ICD-10-CM | POA: Diagnosis not present

## 2020-07-21 LAB — COMPREHENSIVE METABOLIC PANEL
ALT: 15 U/L (ref 0–44)
AST: 18 U/L (ref 15–41)
Albumin: 3.2 g/dL — ABNORMAL LOW (ref 3.5–5.0)
Alkaline Phosphatase: 56 U/L (ref 38–126)
Anion gap: 6 (ref 5–15)
BUN: 14 mg/dL (ref 8–23)
CO2: 26 mmol/L (ref 22–32)
Calcium: 8.3 mg/dL — ABNORMAL LOW (ref 8.9–10.3)
Chloride: 108 mmol/L (ref 98–111)
Creatinine, Ser: 0.67 mg/dL (ref 0.44–1.00)
GFR, Estimated: >60 mL/min (ref >60)
Glucose, Bld: 92 mg/dL (ref 70–99)
Potassium: 3.9 mmol/L (ref 3.5–5.1)
Sodium: 140 mmol/L (ref 135–145)
Total Bilirubin: 0.8 mg/dL (ref 0.3–1.2)
Total Protein: 5.2 g/dL — ABNORMAL LOW (ref 6.5–8.1)

## 2020-07-21 LAB — CBC
HCT: 27.5 % — ABNORMAL LOW (ref 36.0–46.0)
Hemoglobin: 8.8 g/dL — ABNORMAL LOW (ref 12.0–15.0)
MCH: 30.1 pg (ref 26.0–34.0)
MCHC: 32 g/dL (ref 30.0–36.0)
MCV: 94.2 fL (ref 80.0–100.0)
Platelets: 177 10*3/uL (ref 150–400)
RBC: 2.92 MIL/uL — ABNORMAL LOW (ref 3.87–5.11)
RDW: 15.2 % (ref 11.5–15.5)
WBC: 4.5 10*3/uL (ref 4.0–10.5)
nRBC: 0 % (ref 0.0–0.2)

## 2020-07-21 LAB — HEMOGLOBIN AND HEMATOCRIT, BLOOD
HCT: 28.5 % — ABNORMAL LOW (ref 36.0–46.0)
Hemoglobin: 9.2 g/dL — ABNORMAL LOW (ref 12.0–15.0)

## 2020-07-21 NOTE — Progress Notes (Signed)
[facility]  PROGRESS NOTE  [name] OMV:[id number] DOB: 03/19/1956 DOA: 07/20/2020 PCP: Kimberly Jordan, MD   Brief HPI:   65 year old female with medical history of hyperlipidemia, GERD, arthritis presented with rectal bleeding.  Patient states that she had a bowel movement and noticed heavy bright red blood.  She also complained of cramping along the left lower quadrant of the abdomen shortly after bleeding.  She called her GI doc for recommendations and was told to go to the ED for further evaluation. Patient had colonoscopy last year in January 2021 which showed pancolonic diverticulosis. [facility] gastro[facility]ology was consulted  Subjective   Patient seen and examined, no further episodes of bleeding.  Hemoglobin is 8.8 this morning.  Repeat hemoglobin is 9.2.  GI has seen the patient advanced her diet.  No plan for further intervention.   Assessment/Plan:     1. Rectal bleeding-likely diverticular bleed, patient had colonoscopy last year which showed diverticulosis.  GI has seen the patient and asked her diet, no plans for further intervention during this hospitalization.  Will monitor closely for further rectal bleeding. 2. Acute blood loss anemia-hemoglobin was 8.8 this morning, repeat is 9.2.  Will monitor patient for further drop in hemoglobin and transfuse as needed.  If hemoglobin remains stable, likely discharge home in a.m. 3. Anxiety-continue alprazolam 0.5 mg p.o. nightly 4. COPD-continue budesonide nebulization twice daily. 5. Hyperlipidemia-continue atorvastatin        Scheduled medications:   . ALPRAZolam  0.5 mg Oral QHS  . vitamin C  500 mg Oral Daily  . atorvastatin  40 mg Oral Daily  . budesonide  0.25 mg Nebulization BID  . cholecalciferol  1,000 Units Oral Daily  . escitalopram  20 mg Oral Daily  . fluticasone  1 spray Each Nare Daily  . montelukast  10 mg Oral QHS  . multivitamin with minerals  1 tablet Oral Daily  . oxybutynin  5 mg Oral Daily   . valACYclovir  500 mg Oral Daily  . zinc sulfate  220 mg Oral Daily       Data Reviewed:   CBG: No results for input(s): GLUCAP in the last 168 hours.  SpO2: 98 %      CBC:  Recent Labs  Lab 07/20/20 1228 07/20/20 1808 07/21/20 0518 07/21/20 1037  WBC 8.0  --  4.5  --   HGB 10.9* 10.4*  10.3* 8.8* 9.2*  HCT 32.9* 32.1*  31.8* 27.5* 28.5*  PLT 246  --  177  --   MCV 92.2  --  94.2  --   MCH 30.5  --  30.1  --   MCHC 33.1  --  32.0  --   RDW 15.0  --  15.2  --     Complete metabolic panel:  Recent Labs  Lab 07/20/20 1228 07/21/20 0518  NA 139 140  K 4.1 3.9  CL 105 108  CO2 21* 26  GLUCOSE 124* 92  BUN 19 14  CREATININE 0.73 0.67  CALCIUM 8.9 8.3*  AST 24 18  ALT 19 15  ALKPHOS 70 56  BILITOT 0.7 0.8  ALBUMIN 4.0 3.2*    No results for input(s): LIPASE, AMYLASE in the last 168 hours.  Recent Labs  Lab 07/20/20 1543  SARSCOV2NAA NEGATIVE    ------------------------------------------------------------------------------------------------------------------ No results for input(s): CHOL, HDL, LDLCALC, TRIG, CHOLHDL, LDLDIRECT in the last 72 hours.  No results found for: HGBA1C ------------------------------------------------------------------------------------------------------------------ No results for input(s): TSH, T4TOTAL, T3FREE, THYROIDAB in the last  72 hours.  Invalid input(s): FREET3 ------------------------------------------------------------------------------------------------------------------ No results for input(s): VITAMINB12, FOLATE, FERRITIN, TIBC, IRON, RETICCTPCT in the last 72 hours.  Coagulation profile  No results for input(s): INR, PROTIME in the last 168 hours.  No results for input(s): DDIMER in the last 72 hours.  Cardiac Enzymes  No results for input(s): CKMB, TROPONINI, MYOGLOBIN in the last 168 hours.  Invalid input(s):  CK ------------------------------------------------------------------------------------------------------------------ No results found for: BNP   Radiology Reports  No results found.    Antibiotics: Anti-infectives (From admission, onward)   Start     Dose/Rate Route Frequency Ordered Stop   07/21/20 1000  valACYclovir (VALTREX) tablet 500 mg        500 mg Oral Daily 07/20/20 1736         DVT prophylaxis: SCDs  Code Status: Full code  Family Communication: No family at bedside   Consultants:  Gastroenterology  Procedures:      Objective   Vitals:   07/21/20 0507 07/21/20 0814 07/21/20 1041 07/21/20 1323  BP: (!) 107/56  (!) 99/55 (!) 105/55  Pulse: 67  72 69  Resp: 16  16 16   Temp: 98.2 F (36.8 C)  98.4 F (36.9 C) 97.9 F (36.6 C)  TempSrc: Oral  Oral Oral  SpO2: 98% 99% 98% 98%  Weight:      Height:        Intake/Output Summary (Last 24 hours) at 07/21/2020 1325 Last data filed at 07/21/2020 1046 Gross per 24 hour  Intake 460 ml  Output 1250 ml  Net -790 ml    03/17 1901 - 03/19 0700 In: 100 [I.V.:100] Out: 850 [Urine:850]  Filed Weights   07/20/20 1226 07/20/20 1723  Weight: 71 kg 70.3 kg    Physical Examination:    General-appears in no acute distress  Heart-S1-S2, regular, no murmur auscultated  Lungs-clear to auscultation bilaterally, no wheezing or crackles auscultated  Abdomen-soft, nontender, no organomegaly  Extremities-no edema in the lower extremities  Neuro-alert, oriented x3, no focal deficit noted   Status is: Inpatient  Dispo: The patient is from: Home              Anticipated d/c is to: Home              Anticipated d/c date is: 07/22/2020              Patient currently not stable for discharge  Barrier to discharge-monitoring hemoglobin, rectal bleeding  Microbiology  Recent Results (from the past 240 hour(s))  Resp Panel by RT-PCR (Flu A&B, Covid) Nasopharyngeal Swab     Status: None   Collection  Time: 07/20/20  3:43 PM   Specimen: Nasopharyngeal Swab; Nasopharyngeal(NP) swabs in vial transport medium  Result Value Ref Range Status   SARS Coronavirus 2 by RT PCR NEGATIVE NEGATIVE Final    Comment: (NOTE) SARS-CoV-2 target nucleic acids are NOT DETECTED.        Influenza A by PCR NEGATIVE NEGATIVE Final   Influenza B by PCR NEGATIVE NEGATIVE Final    Comment: (NOTE)                [place]   [facility] If 7PM-7AM, please contact night-coverage at [email or web address], Office  [phone]   07/21/2020, 1:25 PM  LOS: 1 day

## 2020-07-21 NOTE — Plan of Care (Signed)
  Problem: Education: Goal: Knowledge of General Education information will improve Description: Including pain rating scale, medication(s)/side effects and non-pharmacologic comfort measures Outcome: Progressing   Problem: Health Behavior/Discharge Planning: Goal: Ability to manage health-related needs will improve Outcome: Progressing   Problem: Clinical Measurements: Goal: Ability to maintain clinical measurements within normal limits will improve Outcome: Progressing Goal: Will remain free from infection Outcome: Progressing Goal: Respiratory complications will improve Outcome: Progressing   Problem: Nutrition: Goal: Adequate nutrition will be maintained Outcome: Progressing

## 2020-07-21 NOTE — Progress Notes (Addendum)
     [facility] Progress Note  CC:  Bleeding  Assessment / Plan: Suspected diverticular bleed presenting with hematochezia. Decline in hemoglobin from 10.4 to 8.8 is reflective of bleeding prior to admission. No further bleeding since hospitalized. She remains hemodynamically stable. As she had a colonoscopy last year, repeating endoscopic evaluation would be low yield at this time. Could consider CTA with additional, significant overt bleeding.  Given her clinical stability, I will advance her diet today. Plan discharge when her hemoglobin stabilizes. She may follow-up at [facility] as needed following discharge. She will continue to stay on her long-term daily Miralax.   Subjective: No further bleeding. No bowel movement since admission. Abdominal discomfort has largely resolved. No new GI symptoms at this time.   Objective:  Vital signs in last 24 hours: Temp:  [97.9 F (36.6 C)-98.8 F (37.1 C)] 98.2 F (36.8 C) (03/19 0507) Pulse Rate:  [63-105] 67 (03/19 0507) Resp:  [15-20] 16 (03/19 0507) BP: (107-123)/(46-87) 107/56 (03/19 0507) SpO2:  [98 %-100 %] 99 % (03/19 0814) Weight:  [70.3 kg-71 kg] 70.3 kg (03/18 1723) Last BM Date: 07/19/20 General:   Alert, in NAD Heart:  Regular rate and rhythm; no murmurs Pulm: Clear anteriorly; no wheezing Abdomen:  Soft. Nontender. Nondistended. Normal bowel sounds. No rebound or guarding. LAD: No inguinal or umbilical LAD Extremities:  Without edema. Neurologic:  Alert and  oriented x4;  grossly normal neurologically. Psych:  Alert and cooperative. Normal mood and affect.  Lab Results: Recent Labs    07/20/20 1228 07/20/20 1808 07/21/20 0518  WBC 8.0  --  4.5  HGB 10.9* 10.4*  10.3* 8.8*  HCT 32.9* 32.1*  31.8* 27.5*  PLT 246  --  177   BMET Recent Labs    07/20/20 1228 07/21/20 0518  NA 139 140  K 4.1 3.9  CL 105 108  CO2 21* 26  GLUCOSE 124* 92  BUN 19 14  CREATININE 0.73 0.67  CALCIUM 8.9 8.3*    LFT Recent Labs    07/21/20 0518  PROT 5.2*  ALBUMIN 3.2*  AST 18  ALT 15  ALKPHOS 56  BILITOT 0.8     LOS: 1 day   [place]  07/21/2020, 8:44 AM

## 2020-07-22 DIAGNOSIS — D649 Anemia, unspecified: Secondary | ICD-10-CM | POA: Diagnosis not present

## 2020-07-22 DIAGNOSIS — K625 Hemorrhage of anus and rectum: Secondary | ICD-10-CM | POA: Diagnosis not present

## 2020-07-22 LAB — CBC
HCT: 28.3 % — ABNORMAL LOW (ref 36.0–46.0)
Hemoglobin: 9.1 g/dL — ABNORMAL LOW (ref 12.0–15.0)
MCH: 30.2 pg (ref 26.0–34.0)
MCHC: 32.2 g/dL (ref 30.0–36.0)
MCV: 94 fL (ref 80.0–100.0)
Platelets: 177 10*3/uL (ref 150–400)
RBC: 3.01 MIL/uL — ABNORMAL LOW (ref 3.87–5.11)
RDW: 15.1 % (ref 11.5–15.5)
WBC: 4.5 10*3/uL (ref 4.0–10.5)
nRBC: 0 % (ref 0.0–0.2)

## 2020-07-22 LAB — BASIC METABOLIC PANEL
Anion gap: 8 (ref 5–15)
BUN: 17 mg/dL (ref 8–23)
CO2: 26 mmol/L (ref 22–32)
Calcium: 8.4 mg/dL — ABNORMAL LOW (ref 8.9–10.3)
Chloride: 105 mmol/L (ref 98–111)
Creatinine, Ser: 0.71 mg/dL (ref 0.44–1.00)
GFR, Estimated: >60 mL/min (ref >60)
Glucose, Bld: 99 mg/dL (ref 70–99)
Potassium: 3.7 mmol/L (ref 3.5–5.1)
Sodium: 139 mmol/L (ref 135–145)

## 2020-07-22 MED ORDER — POLYETHYLENE GLYCOL 3350 17 G PO PACK
17.0000 g | PACK | Freq: Every day | ORAL | Status: DC
  Administered 2020-07-22: 17 g via ORAL
  Filled 2020-07-22 (×2): qty 1

## 2020-07-22 NOTE — Progress Notes (Signed)
Patient had medium size stool with bright red blood noted Dr. Darrick Meigs notified.

## 2020-07-22 NOTE — Discharge Summary (Addendum)
Physician Discharge Summary  [name] JJO:[id number] DOB: Mar 08, 1956 DOA: 07/20/2020  PCP: Jonathon Jordan, MD  Admit date: 07/20/2020 Discharge date: 07/23/2020  Time spent: 50 minutes  Recommendations for Outpatient Follow-up:  1. Follow-up PCP in 2 weeks 2. Follow-up gastroenterology as needed  Discharge Diagnoses:  Active Problems:   BRBPR (bright red blood per rectum)   Symptomatic anemia   Discharge Condition: Stable  Diet recommendation: Heart healthy diet  Filed Weights   07/20/20 1226 07/20/20 1723  Weight: 71 kg 70.3 kg    History of present illness:  65 year old female with medical history of hyperlipidemia, GERD, arthritis presented with rectal bleeding.  Patient states that she had a bowel movement and noticed heavy bright red blood.  She also complained of cramping along the left lower quadrant of the abdomen shortly after bleeding.  She called her GI doc for recommendations and was told to go to the ED for further evaluation. Patient had colonoscopy last year in January 2021 which showed pancolonic diverticulosis. [facility]ology was consulted  Hospital Course:   1. Rectal bleeding-resolved, likely diverticular bleed, patient had colonoscopy last year which showed diverticulosis.  GI has seen the patient and asked her diet, no plans for further intervention during this hospitalization.  She can follow-up with gastroenterology as outpatient as needed. 2. Acute blood loss anemia-hemoglobin was 8.8 this morning, repeat is 9.1.    Will discharge home. 3. Anxiety-continue alprazolam 0.5 mg p.o. nightly 4. COPD-continue budesonide nebulization twice daily. 5. Hyperlipidemia-continue atorvastatin  Discharge was held yesterday as patient had another episode of blood mixed with stool.  Hemoglobin has remained stable at 9.0.  She was again seen by GI this morning, no further intervention recommended.  Patient is stable to go home as per GI, will follow up with GI  as outpatient.  Procedures:    Consultations:  Gastroenterology  Discharge Exam: Vitals:   07/22/20 0418 07/22/20 0805  BP: 103/65   Pulse: 67   Resp: 18   Temp: 98.2 F (36.8 C)   SpO2: 97% 99%    General: Appears in no acute distress Cardiovascular: S1-S2, regular Respiratory: Clear to auscultation bilaterally  Discharge Instructions   Discharge Instructions    Diet - low sodium heart healthy   Complete by: As directed    Increase activity slowly   Complete by: As directed      Allergies as of 07/22/2020      Reactions   Celecoxib    Other reaction(s): GI upset      Medication List    TAKE these medications   albuterol 108 (90 Base) MCG/ACT inhaler Commonly known as: VENTOLIN HFA Inhale 2 puffs into the lungs every 4 (four) hours as needed for wheezing or shortness of breath.   ALPRAZolam 0.5 MG tablet Commonly known as: XANAX TAKE 1 TABLET BY MOUTH EVERY 6 HOURS AS NEEDED What changed:   how much to take  how to take this  when to take this  additional instructions   aspirin EC 81 MG tablet Take 81 mg by mouth daily.   atorvastatin 40 MG tablet Commonly known as: LIPITOR Take 40 mg by mouth daily.   CALCIUM PLUS VITAMIN D PO Take 1 capsule by mouth 2 (two) times daily.   cholecalciferol 25 MCG (1000 UNIT) tablet Commonly known as: VITAMIN D3 Take 1,000 Units by mouth daily.   escitalopram 20 MG tablet Commonly known as: LEXAPRO Take 20 mg by mouth daily.   estradiol 0.1 MG/24HR patch  Commonly known as: VIVELLE-DOT Place 1 patch onto the skin 2 (two) times a week.   Flovent HFA 110 MCG/ACT inhaler Generic drug: fluticasone Inhale 2 puffs into the lungs daily at 6 (six) AM.   fluconazole 150 MG tablet Commonly known as: DIFLUCAN Take 150 mg by mouth once.   fluticasone 50 MCG/ACT nasal spray Commonly known as: FLONASE Place 1 spray into both nostrils daily.   hydrocortisone 2.5 % rectal cream Commonly known as:  ANUSOL-HC Place 1 application rectally at bedtime. As directed What changed:   when to take this  reasons to take this   meloxicam 15 MG tablet Commonly known as: MOBIC Take 1 tablet (15 mg total) by mouth daily.   montelukast 10 MG tablet Commonly known as: SINGULAIR Take 10 mg by mouth at bedtime.   multivitamin capsule Take 1 capsule by mouth daily.   omeprazole 20 MG capsule Commonly known as: PRILOSEC TAKE 1 CAPSULE BY MOUTH 30 - 60 MINUTES BEFORE THE FIRST BIG MEAL OF THE DAY What changed: See the new instructions.   oxybutynin 5 MG tablet Commonly known as: DITROPAN Take 5 mg by mouth daily.   polyethylene glycol 17 g packet Commonly known as: MIRALAX / GLYCOLAX Take 17 g by mouth daily.   valACYclovir 500 MG tablet Commonly known as: VALTREX Take 500 mg by mouth daily.   vitamin C 500 MG tablet Commonly known as: ASCORBIC ACID Take 500 mg by mouth daily.   Vytorin 10-80 MG tablet Generic drug: ezetimibe-simvastatin TAKE 1 TABLET BY MOUTH DAILY AT BEDTIME   zinc gluconate 50 MG tablet Take 50 mg by mouth daily.      Allergies  Allergen Reactions  . Celecoxib     Other reaction(s): GI upset      The results of significant diagnostics from this hospitalization (including imaging, microbiology, ancillary and laboratory) are listed below for reference.    Significant Diagnostic Studies: No results found.  Microbiology: Recent Results (from the past 240 hour(s))  Resp Panel by RT-PCR (Flu A&B, Covid) Nasopharyngeal Swab     Status: None   Collection Time: 07/20/20  3:43 PM   Specimen: Nasopharyngeal Swab; Nasopharyngeal(NP) swabs in vial transport medium  Result Value Ref Range Status   SARS Coronavirus 2 by RT PCR NEGATIVE NEGATIVE Final    Comment: (NOTE) SARS-CoV-2 target nucleic acids are NOT DETECTED.  The SARS-CoV-2 RNA is generally detectable in upper respiratory specimens during the acute phase of infection. The lowest concentration  of SARS-CoV-2 viral copies this assay can detect is 138 copies/mL. A negative result does not preclude SARS-Cov-2 infection and should not be used as the sole basis for treatment or other patient management decisions. A negative result may occur with  improper specimen collection/handling, submission of specimen other than nasopharyngeal swab, presence of viral mutation(s) within the areas targeted by this assay, and inadequate number of viral copies(<138 copies/mL). A negative result must be combined with clinical observations, patient history, and epidemiological information. The expected result is Negative.  Fact Sheet for Patients:  EntrepreneurPulse.com.au  Fact Sheet for Healthcare Providers:  IncredibleEmployment.be  This test is no t yet approved or cleared by the [place] FDA and  has been authorized for detection and/or diagnosis of SARS-CoV-2 by FDA under an Emergency Use Authorization (EUA). This EUA will remain  in effect (meaning this test can be used) for the duration of the COVID-19 declaration under Section 564(b)(1) of the Act, 21 U.S.C.section 360bbb-3(b)(1), unless the authorization is terminated  or revoked sooner.       Influenza A by PCR NEGATIVE NEGATIVE Final   Influenza B by PCR NEGATIVE NEGATIVE Final    Comment: (NOTE) The Xpert Xpress SARS-CoV-2/FLU/RSV plus assay is intended as an aid in the diagnosis of influenza from Nasopharyngeal swab specimens and should not be used as a sole basis for treatment. Nasal washings and aspirates are unacceptable for Xpert Xpress SARS-CoV-2/FLU/RSV testing.  Fact Sheet for Patients: EntrepreneurPulse.com.au  Fact Sheet for Healthcare Providers: IncredibleEmployment.be  This test is not yet approved or cleared by the [place] FDA and has been authorized for detection and/or diagnosis of SARS-CoV-2 by FDA under an Emergency Use  Authorization (EUA). This EUA will remain in effect (meaning this test can be used) for the duration of the COVID-19 declaration under Section 564(b)(1) of the Act, 21 U.S.C. section 360bbb-3(b)(1), unless the authorization is terminated or revoked.  Performed at [facility], [place] [address] [place], [place] [address]      Labs: Basic Metabolic Panel: Recent Labs  Lab 07/20/20 1228 07/21/20 0518 07/22/20 0550  NA 139 140 139  K 4.1 3.9 3.7  CL 105 108 105  CO2 21* 26 26  GLUCOSE 124* 92 99  BUN 19 14 17   CREATININE 0.73 0.67 0.71  CALCIUM 8.9 8.3* 8.4*   Liver Function Tests: Recent Labs  Lab 07/20/20 1228 07/21/20 0518  AST 24 18  ALT 19 15  ALKPHOS 70 56  BILITOT 0.7 0.8  PROT 6.5 5.2*  ALBUMIN 4.0 3.2*   CBC: Recent Labs  Lab 07/20/20 1228 07/20/20 1808 07/21/20 0518 07/21/20 1037 07/22/20 0550  WBC 8.0  --  4.5  --  4.5  HGB 10.9* 10.4*  10.3* 8.8* 9.2* 9.1*  HCT 32.9* 32.1*  31.8* 27.5* 28.5* 28.3*  MCV 92.2  --  94.2  --  94.0  PLT 246  --  177  --  177       Signed:  Oswald Hillock MD.  [facility] 07/22/2020, 10:09 AM

## 2020-07-23 DIAGNOSIS — K5731 Diverticulosis of large intestine without perforation or abscess with bleeding: Secondary | ICD-10-CM

## 2020-07-23 DIAGNOSIS — K625 Hemorrhage of anus and rectum: Secondary | ICD-10-CM | POA: Diagnosis not present

## 2020-07-23 LAB — CBC
HCT: 28 % — ABNORMAL LOW (ref 36.0–46.0)
Hemoglobin: 9 g/dL — ABNORMAL LOW (ref 12.0–15.0)
MCH: 30.4 pg (ref 26.0–34.0)
MCHC: 32.1 g/dL (ref 30.0–36.0)
MCV: 94.6 fL (ref 80.0–100.0)
Platelets: 201 10*3/uL (ref 150–400)
RBC: 2.96 MIL/uL — ABNORMAL LOW (ref 3.87–5.11)
RDW: 15 % (ref 11.5–15.5)
WBC: 5.5 10*3/uL (ref 4.0–10.5)
nRBC: 0 % (ref 0.0–0.2)

## 2020-07-23 NOTE — Progress Notes (Signed)
Pt alert and aware sitting on edge of bed, dressed and ready to go home. Pt states she feels pretty good. She said she has had a bible study this morning. The chaplain offered caring and supportive presence, prayers and blessings.

## 2020-07-23 NOTE — Progress Notes (Signed)
    Progress Note   Subjective  Chief Complaint: Most likely diverticular bleed  Patient did have 1 further stool some bright red blood yesterday, hemoglobin has remained stable around 9 since the 19th, no abdominal pain.  Patient is somewhat fearful that this will happen again and does asked many questions today but is ready to go home.   Objective   Vital signs in last 24 hours: Temp:  [98 F (36.7 C)-98.3 F (36.8 C)] 98.3 F (36.8 C) (03/21 0433) Pulse Rate:  [60-71] 71 (03/21 0433) Resp:  [16-18] 16 (03/21 0433) BP: (112-126)/(59-70) 120/63 (03/21 0433) SpO2:  [97 %-99 %] 99 % (03/21 0833) Last BM Date: 07/22/20 General:    white female in NAD Heart:  Regular rate and rhythm; no murmurs Lungs: Respirations even and unlabored, lungs CTA bilaterally Abdomen:  Soft, nontender and nondistended. Normal bowel sounds. Psych:  Cooperative. Normal mood and affect.  Intake/Output from previous day: 03/20 0701 - 03/21 0700 In: 1080 [P.O.:1080] Out: -  Intake/Output this shift: Total I/O In: 360 [P.O.:360] Out: -   Lab Results: Recent Labs    07/21/20 0518 07/21/20 1037 07/22/20 0550 07/23/20 0501  WBC 4.5  --  4.5 5.5  HGB 8.8* 9.2* 9.1* 9.0*  HCT 27.5* 28.5* 28.3* 28.0*  PLT 177  --  177 201   BMET Recent Labs    07/20/20 1228 07/21/20 0518 07/22/20 0550  NA 139 140 139  K 4.1 3.9 3.7  CL 105 108 105  CO2 21* 26 26  GLUCOSE 124* 92 99  BUN 19 14 17   CREATININE 0.73 0.67 0.71  CALCIUM 8.9 8.3* 8.4*   LFT Recent Labs    07/21/20 0518  PROT 5.2*  ALBUMIN 3.2*  AST 18  ALT 15  ALKPHOS 56  BILITOT 0.8    Assessment / Plan:   Assessment: 1.  Suspected diverticular bleed: 1 small amount of bright red blood on 07/22/2020, none since, hemoglobin stable around 9 since 3/19, recent colonoscopy within the past year 2.  Anemia: Secondary to above  Plan: 1.  Agree with patient's discharge home today. 2.  We will make patient a follow-up appointment in our  clinic in 3 to 4 weeks with [facility] or myself.  We will plan for repeat CBC prior to that appointment. 3.  Discussed with patient that if she sees any further bleeding she can call and let us know. 4.  We will sign off.  Thank you for kind consultation.    LOS: 3 days   Levin Erp  07/23/2020, 10:54 AM

## 2020-07-23 NOTE — Care Management Important Message (Signed)
Important Message  Patient Details IM Letter given to the Patient. Name: MINERVIA OSSO MRN: [id number] Date of Birth: [birth year]   Medicare Important Message Given:  Yes     Kerin Salen 07/23/2020, 12:41 PM

## 2020-07-24 ENCOUNTER — Telehealth: Payer: Self-pay

## 2020-07-24 DIAGNOSIS — D649 Anemia, unspecified: Secondary | ICD-10-CM

## 2020-07-24 DIAGNOSIS — K625 Hemorrhage of anus and rectum: Secondary | ICD-10-CM

## 2020-07-24 NOTE — Telephone Encounter (Signed)
Spoke with patient, she has been scheduled for a follow up with Carl Best, NP on Wednesday, 08/15/20 at 10:30 AM. Patient will come in on Tuesday, 08/14/20 for lab work. Patient wanted to know if she could resume her OTC Aspirin 81 mg, advised that she hold this for now due to her recent bleed and they will advise further when she comes in for her follow up. Patient verbalized understanding and had no concerns at the end of the call.   Lab order and reminder in epic.

## 2020-07-24 NOTE — Telephone Encounter (Signed)
-----   Message from Levin Erp, [place] sent at 07/23/2020 10:57 AM EDT ----- Regarding: appt Please make patient a follow-up appointment in our clinic for diverticular bleed in 3 to 4 weeks with Colleen.  Please arrange for a CBC prior to her visit that day, either that morning or just prior to her appointment or the day before if she is able.  She is being discharged today.  Thanks, J LL

## 2020-07-26 NOTE — Telephone Encounter (Signed)
Spoke with patient, she had questions regarding if she should be following a specific diet or not for her diverticulosis. Advised that there is no specific diet but we do ask patients to avoid foods that take a long time to digest that may get caught in t he diverticulum. Patient states that she started a probiotic about 2 days ago and noticed some bloating. Advised that we usually recommend align probiotic, advised her to try that and if she got the same response she should hold it and further discuss at her upcoming appt. Patient verbalized understanding of all information and had no concerns at the end of the call.

## 2020-07-26 NOTE — Telephone Encounter (Signed)
Patient has questions regarding diverticulitis diet and probiotic.

## 2020-07-30 DIAGNOSIS — K922 Gastrointestinal hemorrhage, unspecified: Secondary | ICD-10-CM | POA: Diagnosis not present

## 2020-07-30 DIAGNOSIS — D62 Acute posthemorrhagic anemia: Secondary | ICD-10-CM | POA: Diagnosis not present

## 2020-08-13 ENCOUNTER — Other Ambulatory Visit (INDEPENDENT_AMBULATORY_CARE_PROVIDER_SITE_OTHER): Payer: Medicare HMO

## 2020-08-13 DIAGNOSIS — D649 Anemia, unspecified: Secondary | ICD-10-CM | POA: Diagnosis not present

## 2020-08-13 DIAGNOSIS — K625 Hemorrhage of anus and rectum: Secondary | ICD-10-CM | POA: Diagnosis not present

## 2020-08-13 LAB — CBC WITH DIFFERENTIAL/PLATELET
Basophils Absolute: 0 10*3/uL (ref 0.0–0.1)
Basophils Relative: 0.6 % (ref 0.0–3.0)
Eosinophils Absolute: 0.1 10*3/uL (ref 0.0–0.7)
Eosinophils Relative: 2.7 % (ref 0.0–5.0)
HCT: 31.6 % — ABNORMAL LOW (ref 36.0–46.0)
Hemoglobin: 10.7 g/dL — ABNORMAL LOW (ref 12.0–15.0)
Lymphocytes Relative: 25.6 % (ref 12.0–46.0)
Lymphs Abs: 1.4 10*3/uL (ref 0.7–4.0)
MCHC: 33.8 g/dL (ref 30.0–36.0)
MCV: 89.6 fl (ref 78.0–100.0)
Monocytes Absolute: 0.4 10*3/uL (ref 0.1–1.0)
Monocytes Relative: 7.3 % (ref 3.0–12.0)
Neutro Abs: 3.4 10*3/uL (ref 1.4–7.7)
Neutrophils Relative %: 63.8 % (ref 43.0–77.0)
Platelets: 287 10*3/uL (ref 150.0–400.0)
RBC: 3.53 Mil/uL — ABNORMAL LOW (ref 3.87–5.11)
RDW: 16.8 % — ABNORMAL HIGH (ref 11.5–15.5)
WBC: 5.4 10*3/uL (ref 4.0–10.5)

## 2020-08-14 NOTE — Progress Notes (Signed)
08/14/2020 [name] [id number] [birth year]   Chief Complaint: Hospital follow up, lower GI bleed   History of Present Illness: [name] is a 65 year old female with a past medical history of anxiety, arthritis, hyperlipidemia, COPD, SBO, GERD and tubular adenomatous colon polyps. She was admitted to the hospital 07/20/2020 with lower abdominal pain with associated painless hematochezia, suspected diverticular bleed. Admission Hg 10.9 (baseline Hg 12.1). She reported taking ASA 81mg  daily and Meloxicam 7.5mg  QD. She did not demonstrate any active bleeding while in the ED and she was hemodynamically stable therefore an abdominal imaging/CTA was not done. She underwent a colonoscopy 05/27/2019 which identified diverticulosis throughout the entire colon and 3 tubular adenomatous polyps which were removed, a repeat colonoscopy was not done, likely low diagnostic yield.her diet was advanced without further hematochezia and she was discharged home on 07/23/2020.  Hemoglobin level was 9.0 at the time of discharge.  She was seen by her PCP Dr. Stephanie Acre on 07/30/2020 and laboratory studies done at that time showed a hemoglobin level 9.9.  Hematocrit 29.4.  Platelet 291.  WBC 5.9.  She was prescribed ferrous sulfate 325 mg 1 p.o. daily.  Repeat labs on 08/13/2020 showed a hemoglobin level 10.7.  Hematocrit 31.6.  MCV 89.6.  Platelet 287.  She denies having any further hematochezia since being discharged from the hospital.  She has chronic constipation for which she takes MiraLAX daily for many years which she continues to take at this time.  Her stools are dark green in color since starting ferrous sulfate.  She is passing 2 solid stools daily.  No black stools or rectal bleeding.  She recently started taking lactobacillus probiotic once daily initially resulted in abdominal bloat which is abated.  She maintained a fairly bland diet for 1 to 2 weeks post hospital discharge and she is slowly adding back  cooked vegetables and fruit without distress.  She remains off ASA.  She questions if she can restart the aspirin.  I advised the patient to follow-up with Dr. Stephanie Acre regarding her risk for CAD and to determine if continued ASA prophylaxis required. Avoid Meloxicam and other NSAIDS.   Colonoscopy 05/27/2019: - Three 1 to 3 mm polyps in the descending colon and in the ascending colon, removed with a cold snare. Resected and retrieved. - Diverticulosis in the entire examined colon. - The examination was otherwise normal on direct and retroflexion views - 5 year recall  TUBULAR ADENOMA (2 OF 2 FRAGMENTS) - NO HIGH GRADE DYSPLASIA OR MALIGNANCY IDENTIFIED  Current Outpatient Medications on File Prior to Visit  Medication Sig Dispense Refill  . ALPRAZolam (XANAX) 0.5 MG tablet TAKE 1 TABLET BY MOUTH EVERY 6 HOURS AS NEEDED (Patient taking differently: Take 0.5 mg by mouth at bedtime.) 90 tablet 0  . Ascorbic Acid (VITAMIN C) 500 MG tablet Take 500 mg by mouth daily.    Marland Kitchen aspirin EC 81 MG tablet Take 81 mg by mouth daily.    Marland Kitchen atorvastatin (LIPITOR) 40 MG tablet Take 40 mg by mouth daily.    . Calcium Carbonate-Vitamin D (CALCIUM PLUS VITAMIN D PO) Take 1 capsule by mouth 2 (two) times daily.    . cholecalciferol (VITAMIN D3) 25 MCG (1000 UNIT) tablet Take 1,000 Units by mouth daily.    Marland Kitchen escitalopram (LEXAPRO) 20 MG tablet Take 20 mg by mouth daily.    Marland Kitchen estradiol (VIVELLE-DOT) 0.1 MG/24HR Place 1 patch onto the skin 2 (two) times a week.    Marland Kitchen  ferrous sulfate 325 (65 FE) MG tablet Take 325 mg by mouth daily with breakfast.    . fluconazole (DIFLUCAN) 150 MG tablet Take 150 mg by mouth once.    . fluticasone (FLONASE) 50 MCG/ACT nasal spray Place 1 spray into both nostrils daily.    . fluticasone (FLOVENT HFA) 110 MCG/ACT inhaler Inhale 2 puffs into the lungs daily at 6 (six) AM.    . hydrocortisone (ANUSOL-HC) 2.5 % rectal cream Place 1 application rectally at bedtime. As directed (Patient taking  differently: Place 1 application rectally daily as needed for hemorrhoids. As directed) 30 g 1  . Lactobacillus (CULTURELLE DIGESTIVE WOMENS PO) Take 1 tablet by mouth daily.    . meloxicam (MOBIC) 15 MG tablet Take 1 tablet (15 mg total) by mouth daily. 90 tablet 0  . Multiple Vitamin (MULTIVITAMIN) capsule Take 1 capsule by mouth daily.    Marland Kitchen omeprazole (PRILOSEC) 20 MG capsule TAKE 1 CAPSULE BY MOUTH 30 - 60 MINUTES BEFORE THE FIRST BIG MEAL OF THE DAY (Patient taking differently: Take 20 mg by mouth daily.) 30 capsule 11  . oxybutynin (DITROPAN) 5 MG tablet Take 5 mg by mouth daily.    . polyethylene glycol (MIRALAX / GLYCOLAX) packet Take 17 g by mouth daily.    . valACYclovir (VALTREX) 500 MG tablet Take 500 mg by mouth daily.    Marland Kitchen VYTORIN 10-80 MG per tablet TAKE 1 TABLET BY MOUTH DAILY AT BEDTIME 30 tablet 5  . zinc gluconate 50 MG tablet Take 50 mg by mouth daily.    Marland Kitchen albuterol (VENTOLIN HFA) 108 (90 Base) MCG/ACT inhaler Inhale 2 puffs into the lungs every 4 (four) hours as needed for wheezing or shortness of breath. (Patient not taking: Reported on 08/15/2020)    . montelukast (SINGULAIR) 10 MG tablet Take 10 mg by mouth at bedtime.    . [DISCONTINUED] diphenhydramine-acetaminophen (TYLENOL PM) 25-500 MG TABS Take 1 tablet by mouth at bedtime as needed.       No current facility-administered medications on file prior to visit.   Allergies  Allergen Reactions  . Celebrex [Celecoxib]     Other reaction(s): GI upset    Current Medications, Allergies, Past Medical History, Past Surgical History, Family History and Social History were reviewed in [employer] record.   Review of Systems:   Constitutional: Negative for fever, sweats, chills or weight loss.  Respiratory: Negative for shortness of breath.   Cardiovascular: Negative for chest pain, palpitations and leg swelling.  Gastrointestinal: See HPI.  Musculoskeletal: Negative for back pain or muscle aches.   Neurological: Negative for dizziness, headaches or paresthesias.    Physical Exam: BP 94/60 (BP Location: Left Arm, Patient Position: Sitting, Cuff Size: Normal)   Pulse 76   Ht 5\' 1"  (1.549 m) Comment: height measured without shoes  Wt 151 lb 8 oz (68.7 kg)   BMI 28.63 kg/m   General: 65 year old female in no acute distress. Head: Normocephalic and atraumatic. Eyes: No scleral icterus. Conjunctiva pink . Ears: Normal auditory acuity. Mouth: Dentition intact. No ulcers or lesions.  Lungs: Clear throughout to auscultation. Heart: Regular rate and rhythm, no murmur. Abdomen: Soft, nontender and nondistended. No masses or hepatomegaly. Normal bowel sounds x 4 quadrants.  Rectal: Deferred.  Musculoskeletal: Symmetrical with no gross deformities. Extremities: No edema. Neurological: Alert oriented x 4. No focal deficits.  Psychological: Alert and cooperative. Normal mood and affect  Assessment and Recommendations:  1. Hematochezia, suspected diverticular bleed. No further  hematochezia since hospital discharge.  -Patient to contact her office if hematochezia recurs -Brisk lower GI bleeding recurs patient will be sent directly to the ED with recommendations for an abdominal/pelvic CT angiogram -Patient to contact PCP regarding ASA recommendations.  Avoid all other NSAIDs for now. -Patient to advance diet as tolerated, slowly add back fresh vegetables and fiber products as tolerated.  Dairy products if tolerated.  2. Acute anemia secondary to # 1.  Continue Ferrous Sulfate 325 mg 1 p.o. daily. Hg 9.0 -> 9.9 -> 10.7. -Continue Ferrous Sulfate 325mg  on po QD -Repeat CBC with PCP as previously scheduled, recommend checking iron, iron saturation, TIBC and ferritin level at that time as well  3. History of colon polyps  -Next colonoscopy due 05/2024  4. Chronic constipation  -Continue Miralax Q HS -May increase Miralax to bid if constipation worsens on oral iron

## 2020-08-15 ENCOUNTER — Encounter: Payer: Self-pay | Admitting: Nurse Practitioner

## 2020-08-15 ENCOUNTER — Ambulatory Visit: Payer: Medicare HMO | Admitting: Nurse Practitioner

## 2020-08-15 VITALS — BP 94/60 | HR 76 | Ht 61.0 in | Wt 151.5 lb

## 2020-08-15 DIAGNOSIS — K5731 Diverticulosis of large intestine without perforation or abscess with bleeding: Secondary | ICD-10-CM | POA: Diagnosis not present

## 2020-08-15 DIAGNOSIS — D649 Anemia, unspecified: Secondary | ICD-10-CM

## 2020-08-15 NOTE — Patient Instructions (Signed)
If you are age 67 or older, your body mass index should be between 23-30. Your Body mass index is 28.63 kg/m. If this is out of the aforementioned range listed, please consider follow up with your Primary Care Provider.  Repeat CBC with your primary care provider in 2 weeks as discussed.  Continue taking Ferrous Sulfate 325 mg once a day.  Miralax- Dissolve one capful in 8 ounces of water and drink before bed.  Please call our office if your symptoms worsen.  It was great seeing you today! Thank you for entrusting me with your care and choosing [facility].  Noralyn Pick, CRNP

## 2020-08-15 NOTE — Progress Notes (Signed)
Assessment and plan noted ?

## 2020-08-23 DIAGNOSIS — L578 Other skin changes due to chronic exposure to nonionizing radiation: Secondary | ICD-10-CM | POA: Diagnosis not present

## 2020-08-23 DIAGNOSIS — D2361 Other benign neoplasm of skin of right upper limb, including shoulder: Secondary | ICD-10-CM | POA: Diagnosis not present

## 2020-08-23 DIAGNOSIS — D1801 Hemangioma of skin and subcutaneous tissue: Secondary | ICD-10-CM | POA: Diagnosis not present

## 2020-08-23 DIAGNOSIS — D1721 Benign lipomatous neoplasm of skin and subcutaneous tissue of right arm: Secondary | ICD-10-CM | POA: Diagnosis not present

## 2020-08-23 DIAGNOSIS — L814 Other melanin hyperpigmentation: Secondary | ICD-10-CM | POA: Diagnosis not present

## 2020-08-23 DIAGNOSIS — L570 Actinic keratosis: Secondary | ICD-10-CM | POA: Diagnosis not present

## 2020-08-23 DIAGNOSIS — L821 Other seborrheic keratosis: Secondary | ICD-10-CM | POA: Diagnosis not present

## 2020-08-27 DIAGNOSIS — K922 Gastrointestinal hemorrhage, unspecified: Secondary | ICD-10-CM | POA: Diagnosis not present

## 2020-09-24 DIAGNOSIS — D62 Acute posthemorrhagic anemia: Secondary | ICD-10-CM | POA: Diagnosis not present

## 2020-10-05 DIAGNOSIS — M549 Dorsalgia, unspecified: Secondary | ICD-10-CM | POA: Diagnosis not present

## 2020-10-05 DIAGNOSIS — M47816 Spondylosis without myelopathy or radiculopathy, lumbar region: Secondary | ICD-10-CM | POA: Diagnosis not present

## 2020-10-05 DIAGNOSIS — N342 Other urethritis: Secondary | ICD-10-CM | POA: Diagnosis not present

## 2020-10-05 DIAGNOSIS — M5134 Other intervertebral disc degeneration, thoracic region: Secondary | ICD-10-CM | POA: Diagnosis not present

## 2020-10-05 DIAGNOSIS — F411 Generalized anxiety disorder: Secondary | ICD-10-CM | POA: Diagnosis not present

## 2020-10-18 DIAGNOSIS — C4441 Basal cell carcinoma of skin of scalp and neck: Secondary | ICD-10-CM | POA: Diagnosis not present

## 2020-10-18 DIAGNOSIS — C44529 Squamous cell carcinoma of skin of other part of trunk: Secondary | ICD-10-CM | POA: Diagnosis not present

## 2020-10-18 DIAGNOSIS — B882 Other arthropod infestations: Secondary | ICD-10-CM | POA: Diagnosis not present

## 2020-10-18 DIAGNOSIS — Z85828 Personal history of other malignant neoplasm of skin: Secondary | ICD-10-CM | POA: Diagnosis not present

## 2020-10-18 DIAGNOSIS — D1801 Hemangioma of skin and subcutaneous tissue: Secondary | ICD-10-CM | POA: Diagnosis not present

## 2020-10-29 DIAGNOSIS — R059 Cough, unspecified: Secondary | ICD-10-CM | POA: Diagnosis not present

## 2020-11-26 DIAGNOSIS — C4441 Basal cell carcinoma of skin of scalp and neck: Secondary | ICD-10-CM | POA: Diagnosis not present

## 2020-11-26 DIAGNOSIS — C44529 Squamous cell carcinoma of skin of other part of trunk: Secondary | ICD-10-CM | POA: Diagnosis not present

## 2020-11-26 DIAGNOSIS — Z85828 Personal history of other malignant neoplasm of skin: Secondary | ICD-10-CM | POA: Diagnosis not present

## 2020-12-20 DIAGNOSIS — H1132 Conjunctival hemorrhage, left eye: Secondary | ICD-10-CM | POA: Diagnosis not present

## 2020-12-20 DIAGNOSIS — M549 Dorsalgia, unspecified: Secondary | ICD-10-CM | POA: Diagnosis not present

## 2020-12-20 DIAGNOSIS — J309 Allergic rhinitis, unspecified: Secondary | ICD-10-CM | POA: Diagnosis not present

## 2021-01-21 DIAGNOSIS — Z1231 Encounter for screening mammogram for malignant neoplasm of breast: Secondary | ICD-10-CM | POA: Diagnosis not present

## 2021-01-21 DIAGNOSIS — Z6829 Body mass index (BMI) 29.0-29.9, adult: Secondary | ICD-10-CM | POA: Diagnosis not present

## 2021-01-21 DIAGNOSIS — Z01419 Encounter for gynecological examination (general) (routine) without abnormal findings: Secondary | ICD-10-CM | POA: Diagnosis not present

## 2021-02-28 DIAGNOSIS — Z85828 Personal history of other malignant neoplasm of skin: Secondary | ICD-10-CM | POA: Diagnosis not present

## 2021-02-28 DIAGNOSIS — C44519 Basal cell carcinoma of skin of other part of trunk: Secondary | ICD-10-CM | POA: Diagnosis not present

## 2021-02-28 DIAGNOSIS — L821 Other seborrheic keratosis: Secondary | ICD-10-CM | POA: Diagnosis not present

## 2021-02-28 DIAGNOSIS — L905 Scar conditions and fibrosis of skin: Secondary | ICD-10-CM | POA: Diagnosis not present

## 2021-02-28 DIAGNOSIS — L570 Actinic keratosis: Secondary | ICD-10-CM | POA: Diagnosis not present

## 2021-02-28 DIAGNOSIS — D2361 Other benign neoplasm of skin of right upper limb, including shoulder: Secondary | ICD-10-CM | POA: Diagnosis not present

## 2021-02-28 DIAGNOSIS — L814 Other melanin hyperpigmentation: Secondary | ICD-10-CM | POA: Diagnosis not present

## 2021-04-15 DIAGNOSIS — H25813 Combined forms of age-related cataract, bilateral: Secondary | ICD-10-CM | POA: Diagnosis not present

## 2021-04-15 DIAGNOSIS — H524 Presbyopia: Secondary | ICD-10-CM | POA: Diagnosis not present

## 2021-04-15 DIAGNOSIS — H5213 Myopia, bilateral: Secondary | ICD-10-CM | POA: Diagnosis not present

## 2021-04-15 DIAGNOSIS — H52203 Unspecified astigmatism, bilateral: Secondary | ICD-10-CM | POA: Diagnosis not present

## 2021-04-15 DIAGNOSIS — H43393 Other vitreous opacities, bilateral: Secondary | ICD-10-CM | POA: Diagnosis not present

## 2021-04-18 DIAGNOSIS — Z0100 Encounter for examination of eyes and vision without abnormal findings: Secondary | ICD-10-CM | POA: Diagnosis not present

## 2021-04-22 DIAGNOSIS — Z0000 Encounter for general adult medical examination without abnormal findings: Secondary | ICD-10-CM | POA: Diagnosis not present

## 2021-04-22 DIAGNOSIS — Z79899 Other long term (current) drug therapy: Secondary | ICD-10-CM | POA: Diagnosis not present

## 2021-04-22 DIAGNOSIS — E785 Hyperlipidemia, unspecified: Secondary | ICD-10-CM | POA: Diagnosis not present

## 2021-04-22 DIAGNOSIS — F411 Generalized anxiety disorder: Secondary | ICD-10-CM | POA: Diagnosis not present

## 2021-04-22 DIAGNOSIS — K219 Gastro-esophageal reflux disease without esophagitis: Secondary | ICD-10-CM | POA: Diagnosis not present

## 2021-04-22 DIAGNOSIS — J449 Chronic obstructive pulmonary disease, unspecified: Secondary | ICD-10-CM | POA: Diagnosis not present

## 2021-04-22 DIAGNOSIS — E559 Vitamin D deficiency, unspecified: Secondary | ICD-10-CM | POA: Diagnosis not present

## 2021-04-22 DIAGNOSIS — G479 Sleep disorder, unspecified: Secondary | ICD-10-CM | POA: Diagnosis not present

## 2021-04-22 DIAGNOSIS — J45909 Unspecified asthma, uncomplicated: Secondary | ICD-10-CM | POA: Diagnosis not present

## 2021-04-25 DIAGNOSIS — E785 Hyperlipidemia, unspecified: Secondary | ICD-10-CM | POA: Diagnosis not present

## 2021-04-25 DIAGNOSIS — J449 Chronic obstructive pulmonary disease, unspecified: Secondary | ICD-10-CM | POA: Diagnosis not present

## 2021-04-25 DIAGNOSIS — K219 Gastro-esophageal reflux disease without esophagitis: Secondary | ICD-10-CM | POA: Diagnosis not present

## 2021-05-22 DIAGNOSIS — H524 Presbyopia: Secondary | ICD-10-CM | POA: Diagnosis not present

## 2021-05-22 DIAGNOSIS — Z0100 Encounter for examination of eyes and vision without abnormal findings: Secondary | ICD-10-CM | POA: Diagnosis not present

## 2021-05-28 DIAGNOSIS — Z85828 Personal history of other malignant neoplasm of skin: Secondary | ICD-10-CM | POA: Diagnosis not present

## 2021-05-28 DIAGNOSIS — L578 Other skin changes due to chronic exposure to nonionizing radiation: Secondary | ICD-10-CM | POA: Diagnosis not present

## 2021-07-29 DIAGNOSIS — J449 Chronic obstructive pulmonary disease, unspecified: Secondary | ICD-10-CM | POA: Diagnosis not present

## 2021-07-29 DIAGNOSIS — K219 Gastro-esophageal reflux disease without esophagitis: Secondary | ICD-10-CM | POA: Diagnosis not present

## 2021-07-29 DIAGNOSIS — F411 Generalized anxiety disorder: Secondary | ICD-10-CM | POA: Diagnosis not present

## 2021-07-29 DIAGNOSIS — E785 Hyperlipidemia, unspecified: Secondary | ICD-10-CM | POA: Diagnosis not present

## 2021-08-29 DIAGNOSIS — R42 Dizziness and giddiness: Secondary | ICD-10-CM | POA: Diagnosis not present

## 2021-09-25 DIAGNOSIS — K219 Gastro-esophageal reflux disease without esophagitis: Secondary | ICD-10-CM | POA: Diagnosis not present

## 2021-09-25 DIAGNOSIS — J449 Chronic obstructive pulmonary disease, unspecified: Secondary | ICD-10-CM | POA: Diagnosis not present

## 2021-09-25 DIAGNOSIS — E785 Hyperlipidemia, unspecified: Secondary | ICD-10-CM | POA: Diagnosis not present

## 2021-11-26 DIAGNOSIS — F411 Generalized anxiety disorder: Secondary | ICD-10-CM | POA: Diagnosis not present

## 2022-01-07 DIAGNOSIS — M25561 Pain in right knee: Secondary | ICD-10-CM | POA: Diagnosis not present

## 2022-01-07 DIAGNOSIS — M25562 Pain in left knee: Secondary | ICD-10-CM | POA: Diagnosis not present

## 2022-01-23 DIAGNOSIS — Z1272 Encounter for screening for malignant neoplasm of vagina: Secondary | ICD-10-CM | POA: Diagnosis not present

## 2022-01-23 DIAGNOSIS — Z1231 Encounter for screening mammogram for malignant neoplasm of breast: Secondary | ICD-10-CM | POA: Diagnosis not present

## 2022-01-23 DIAGNOSIS — N959 Unspecified menopausal and perimenopausal disorder: Secondary | ICD-10-CM | POA: Diagnosis not present

## 2022-01-23 DIAGNOSIS — N958 Other specified menopausal and perimenopausal disorders: Secondary | ICD-10-CM | POA: Diagnosis not present

## 2022-01-23 DIAGNOSIS — Z8262 Family history of osteoporosis: Secondary | ICD-10-CM | POA: Diagnosis not present

## 2022-01-23 DIAGNOSIS — Z13820 Encounter for screening for osteoporosis: Secondary | ICD-10-CM | POA: Diagnosis not present

## 2022-01-23 DIAGNOSIS — Z6830 Body mass index (BMI) 30.0-30.9, adult: Secondary | ICD-10-CM | POA: Diagnosis not present

## 2022-01-23 DIAGNOSIS — Z124 Encounter for screening for malignant neoplasm of cervix: Secondary | ICD-10-CM | POA: Diagnosis not present

## 2022-01-24 DIAGNOSIS — F330 Major depressive disorder, recurrent, mild: Secondary | ICD-10-CM | POA: Diagnosis not present

## 2022-01-24 DIAGNOSIS — E785 Hyperlipidemia, unspecified: Secondary | ICD-10-CM | POA: Diagnosis not present

## 2022-01-24 DIAGNOSIS — J449 Chronic obstructive pulmonary disease, unspecified: Secondary | ICD-10-CM | POA: Diagnosis not present

## 2022-01-24 DIAGNOSIS — K219 Gastro-esophageal reflux disease without esophagitis: Secondary | ICD-10-CM | POA: Diagnosis not present

## 2022-02-11 DIAGNOSIS — B372 Candidiasis of skin and nail: Secondary | ICD-10-CM | POA: Diagnosis not present

## 2022-03-04 ENCOUNTER — Telehealth: Payer: Self-pay | Admitting: *Deleted

## 2022-03-04 NOTE — Patient Outreach (Signed)
  Care Coordination   03/04/2022 Name: [name] MRN: [id number] DOB: January 15, 1956   Care Coordination Outreach Attempts:  An unsuccessful telephone outreach was attempted today to offer the patient information about available care coordination services as a benefit of their health plan.   Follow Up Plan:  Additional outreach attempts will be made to offer the patient care coordination information and services.   Encounter Outcome:  No Answer  Care Coordination Interventions Activated:  No   Care Coordination Interventions:  No, not indicated    Raina Mina, RN Care Management Coordinator [place] Office [phone]

## 2022-03-19 DIAGNOSIS — F330 Major depressive disorder, recurrent, mild: Secondary | ICD-10-CM | POA: Diagnosis not present

## 2022-03-19 DIAGNOSIS — E785 Hyperlipidemia, unspecified: Secondary | ICD-10-CM | POA: Diagnosis not present

## 2022-03-19 DIAGNOSIS — K219 Gastro-esophageal reflux disease without esophagitis: Secondary | ICD-10-CM | POA: Diagnosis not present

## 2022-03-19 DIAGNOSIS — U071 COVID-19: Secondary | ICD-10-CM | POA: Diagnosis not present

## 2022-03-19 DIAGNOSIS — J449 Chronic obstructive pulmonary disease, unspecified: Secondary | ICD-10-CM | POA: Diagnosis not present

## 2022-04-17 DIAGNOSIS — H25813 Combined forms of age-related cataract, bilateral: Secondary | ICD-10-CM | POA: Diagnosis not present

## 2022-04-17 DIAGNOSIS — H52203 Unspecified astigmatism, bilateral: Secondary | ICD-10-CM | POA: Diagnosis not present

## 2022-04-17 DIAGNOSIS — H524 Presbyopia: Secondary | ICD-10-CM | POA: Diagnosis not present

## 2022-04-17 DIAGNOSIS — H5213 Myopia, bilateral: Secondary | ICD-10-CM | POA: Diagnosis not present

## 2022-04-17 DIAGNOSIS — H579 Unspecified disorder of eye and adnexa: Secondary | ICD-10-CM | POA: Diagnosis not present

## 2022-05-12 DIAGNOSIS — Z23 Encounter for immunization: Secondary | ICD-10-CM | POA: Diagnosis not present

## 2022-05-12 DIAGNOSIS — E559 Vitamin D deficiency, unspecified: Secondary | ICD-10-CM | POA: Diagnosis not present

## 2022-05-12 DIAGNOSIS — J309 Allergic rhinitis, unspecified: Secondary | ICD-10-CM | POA: Diagnosis not present

## 2022-05-12 DIAGNOSIS — J449 Chronic obstructive pulmonary disease, unspecified: Secondary | ICD-10-CM | POA: Diagnosis not present

## 2022-05-12 DIAGNOSIS — Z0000 Encounter for general adult medical examination without abnormal findings: Secondary | ICD-10-CM | POA: Diagnosis not present

## 2022-05-12 DIAGNOSIS — F411 Generalized anxiety disorder: Secondary | ICD-10-CM | POA: Diagnosis not present

## 2022-05-12 DIAGNOSIS — K219 Gastro-esophageal reflux disease without esophagitis: Secondary | ICD-10-CM | POA: Diagnosis not present

## 2022-05-12 DIAGNOSIS — E785 Hyperlipidemia, unspecified: Secondary | ICD-10-CM | POA: Diagnosis not present

## 2022-05-12 DIAGNOSIS — Z79899 Other long term (current) drug therapy: Secondary | ICD-10-CM | POA: Diagnosis not present

## 2022-06-11 DIAGNOSIS — D2361 Other benign neoplasm of skin of right upper limb, including shoulder: Secondary | ICD-10-CM | POA: Diagnosis not present

## 2022-06-11 DIAGNOSIS — C44712 Basal cell carcinoma of skin of right lower limb, including hip: Secondary | ICD-10-CM | POA: Diagnosis not present

## 2022-06-11 DIAGNOSIS — L578 Other skin changes due to chronic exposure to nonionizing radiation: Secondary | ICD-10-CM | POA: Diagnosis not present

## 2022-06-11 DIAGNOSIS — L570 Actinic keratosis: Secondary | ICD-10-CM | POA: Diagnosis not present

## 2022-06-11 DIAGNOSIS — Z85828 Personal history of other malignant neoplasm of skin: Secondary | ICD-10-CM | POA: Diagnosis not present

## 2022-06-11 DIAGNOSIS — C44719 Basal cell carcinoma of skin of left lower limb, including hip: Secondary | ICD-10-CM | POA: Diagnosis not present

## 2022-06-11 DIAGNOSIS — L821 Other seborrheic keratosis: Secondary | ICD-10-CM | POA: Diagnosis not present

## 2022-10-14 ENCOUNTER — Ambulatory Visit (INDEPENDENT_AMBULATORY_CARE_PROVIDER_SITE_OTHER): Payer: Medicare HMO

## 2022-10-14 ENCOUNTER — Encounter: Payer: Self-pay | Admitting: Podiatry

## 2022-10-14 ENCOUNTER — Other Ambulatory Visit: Payer: Self-pay | Admitting: Podiatry

## 2022-10-14 ENCOUNTER — Ambulatory Visit: Payer: Medicare HMO | Admitting: Podiatry

## 2022-10-14 DIAGNOSIS — M778 Other enthesopathies, not elsewhere classified: Secondary | ICD-10-CM | POA: Diagnosis not present

## 2022-10-14 DIAGNOSIS — S9002XA Contusion of left ankle, initial encounter: Secondary | ICD-10-CM

## 2022-10-14 DIAGNOSIS — S93402A Sprain of unspecified ligament of left ankle, initial encounter: Secondary | ICD-10-CM

## 2022-10-14 DIAGNOSIS — M722 Plantar fascial fibromatosis: Secondary | ICD-10-CM | POA: Insufficient documentation

## 2022-10-15 DIAGNOSIS — N393 Stress incontinence (female) (male): Secondary | ICD-10-CM | POA: Diagnosis not present

## 2022-10-15 DIAGNOSIS — R350 Frequency of micturition: Secondary | ICD-10-CM | POA: Diagnosis not present

## 2022-10-15 NOTE — Progress Notes (Signed)
Subjective:  Patient ID: [name], female    DOB: 07/05/1955,  MRN: [id number] HPI Chief Complaint  Patient presents with   Ankle Pain    Ankle left - fell and twisted foot/ankle on 10/05/22 while [place] the beach, very swollen and still bruised, hit knee also which also got scraped and very swollen, her whole leg is hurting, can't wear a shoe, been icing    New Patient (Initial Visit)    Est pt 03/2020    67 y.o. female presents with the above complaint.   ROS: Denies fever chills nausea vomit muscle aches pains calf pain back pain chest pain shortness of breath.  Past Medical History:  Diagnosis Date   Anxiety    Arthritis    Bronchitis    Cancer (HCC)    basal cell   Colon polyp    COPD (chronic obstructive pulmonary disease) (HCC)    GERD (gastroesophageal reflux disease)    Hemoptysis    Hyperlipidemia    Rhinitis    Symptomatic anemia    Past Surgical History:  Procedure Laterality Date   APPENDECTOMY     BACK SURGERY     Bladder prolapse repair  11/08   COLONOSCOPY     UPPER GASTROINTESTINAL ENDOSCOPY     VESICOVAGINAL FISTULA CLOSURE W/ TAH  1989    Current Outpatient Medications:    albuterol (VENTOLIN HFA) 108 (90 Base) MCG/ACT inhaler, Inhale 2 puffs into the lungs every 4 (four) hours as needed for wheezing or shortness of breath. (Patient not taking: Reported on 08/15/2020), Disp: , Rfl:    ALPRAZolam (XANAX) 0.5 MG tablet, TAKE 1 TABLET BY MOUTH EVERY 6 HOURS AS NEEDED (Patient taking differently: Take 0.5 mg by mouth at bedtime.), Disp: 90 tablet, Rfl: 0   Ascorbic Acid (VITAMIN C) 500 MG tablet, Take 500 mg by mouth daily., Disp: , Rfl:    aspirin EC 81 MG tablet, Take 81 mg by mouth daily., Disp: , Rfl:    atorvastatin (LIPITOR) 40 MG tablet, Take 40 mg by mouth daily., Disp: , Rfl:    Calcium Carbonate-Vitamin D (CALCIUM PLUS VITAMIN D PO), Take 1 capsule by mouth 2 (two) times daily., Disp: , Rfl:    cholecalciferol (VITAMIN D3) 25 MCG (1000 UNIT)  tablet, Take 1,000 Units by mouth daily., Disp: , Rfl:    escitalopram (LEXAPRO) 20 MG tablet, Take 20 mg by mouth daily., Disp: , Rfl:    estradiol (VIVELLE-DOT) 0.1 MG/24HR, Place 1 patch onto the skin 2 (two) times a week., Disp: , Rfl:    ferrous sulfate 325 (65 FE) MG tablet, Take 325 mg by mouth daily with breakfast., Disp: , Rfl:    fluconazole (DIFLUCAN) 150 MG tablet, Take 150 mg by mouth once., Disp: , Rfl:    fluticasone (FLONASE) 50 MCG/ACT nasal spray, Place 1 spray into both nostrils daily., Disp: , Rfl:    fluticasone (FLOVENT HFA) 110 MCG/ACT inhaler, Inhale 2 puffs into the lungs daily at 6 (six) AM., Disp: , Rfl:    hydrocortisone (ANUSOL-HC) 2.5 % rectal cream, Place 1 application rectally at bedtime. As directed (Patient taking differently: Place 1 application rectally daily as needed for hemorrhoids. As directed), Disp: 30 g, Rfl: 1   Lactobacillus (CULTURELLE DIGESTIVE WOMENS PO), Take 1 tablet by mouth daily., Disp: , Rfl:    meloxicam (MOBIC) 15 MG tablet, Take 1 tablet (15 mg total) by mouth daily., Disp: 90 tablet, Rfl: 0   montelukast (SINGULAIR) 10 MG tablet, Take  10 mg by mouth at bedtime., Disp: , Rfl:    Multiple Vitamin (MULTIVITAMIN) capsule, Take 1 capsule by mouth daily., Disp: , Rfl:    omeprazole (PRILOSEC) 20 MG capsule, TAKE 1 CAPSULE BY MOUTH 30 - 60 MINUTES BEFORE THE FIRST BIG MEAL OF THE DAY (Patient taking differently: Take 20 mg by mouth daily.), Disp: 30 capsule, Rfl: 11   oxybutynin (DITROPAN) 5 MG tablet, Take 5 mg by mouth daily., Disp: , Rfl:    polyethylene glycol (MIRALAX / GLYCOLAX) packet, Take 17 g by mouth daily., Disp: , Rfl:    valACYclovir (VALTREX) 500 MG tablet, Take 500 mg by mouth daily., Disp: , Rfl:    VYTORIN 10-80 MG per tablet, TAKE 1 TABLET BY MOUTH DAILY AT BEDTIME, Disp: 30 tablet, Rfl: 5   zinc gluconate 50 MG tablet, Take 50 mg by mouth daily., Disp: , Rfl:   Allergies  Allergen Reactions   Celebrex [Celecoxib]     Other  reaction(s): GI upset   Review of Systems Objective:  There were no vitals filed for this visit.  General: Well developed, nourished, in no acute distress, alert and oriented x3   Dermatological: Skin is warm, dry and supple bilateral. Nails x 10 are well maintained; remaining integument appears unremarkable at this time. There are no open sores, no preulcerative lesions, no rash or signs of infection present.  Abrasions to her left knee  Vascular: Dorsalis Pedis artery and Posterior Tibial artery pedal pulses are 2/4 bilateral with immedate capillary fill time. Pedal hair growth present. No varicosities and no lower extremity edema present bilateral.   Neruologic: Grossly intact via light touch bilateral. Vibratory intact via tuning fork bilateral. Protective threshold with Semmes Wienstein monofilament intact to all pedal sites bilateral. Patellar and Achilles deep tendon reflexes 2+ bilateral. No Babinski or clonus noted bilateral.   Musculoskeletal: No gross boney pedal deformities bilateral. No pain, crepitus, or limitation noted with foot and ankle range of motion bilateral. Muscular strength 5/5 in all groups tested bilateral.  Abrasions to her left knee with swelling to her left knee she has no pain in the calf there is no erythema no swelling in the calf no pain on palpation.  She does have some swelling in her ankle with some ecchymosis on the lateral aspect.  All of the tendons and ligaments appear to be intact negative anterior and posterior drawers she does have some tenderness just medial to the tibialis anterior tendon at the retinaculum by the ankle itself.  Some tenderness in the gutter medially.  No signs of infection.  Gait: Unassisted, Nonantalgic.    Radiographs:  Radiographs reviewed do not demonstrate any type of osseous abnormalities the mortise is intact minimal soft tissue swelling no acute findings  Assessment & Plan:   Assessment: Sprained ankle  Plan: Put her  in a compression anklet and a Tri-Lock brace will follow-up with her in a few weeks.  Highly recommended that she stay in tennis shoes rather than flip-flops sandals or bare feet.  Also recommended that she follow-up with orthopedics for her left knee.     Raynard Mapps T. [place], [place]

## 2022-11-04 DIAGNOSIS — R350 Frequency of micturition: Secondary | ICD-10-CM | POA: Diagnosis not present

## 2022-11-04 DIAGNOSIS — N393 Stress incontinence (female) (male): Secondary | ICD-10-CM | POA: Diagnosis not present

## 2022-11-14 DIAGNOSIS — R350 Frequency of micturition: Secondary | ICD-10-CM | POA: Diagnosis not present

## 2022-11-14 DIAGNOSIS — N393 Stress incontinence (female) (male): Secondary | ICD-10-CM | POA: Diagnosis not present

## 2022-11-25 ENCOUNTER — Ambulatory Visit: Payer: PPO | Admitting: Podiatry

## 2022-12-04 DIAGNOSIS — M25562 Pain in left knee: Secondary | ICD-10-CM | POA: Diagnosis not present

## 2022-12-04 DIAGNOSIS — M25561 Pain in right knee: Secondary | ICD-10-CM | POA: Diagnosis not present

## 2022-12-19 DIAGNOSIS — F411 Generalized anxiety disorder: Secondary | ICD-10-CM | POA: Diagnosis not present

## 2022-12-19 DIAGNOSIS — J309 Allergic rhinitis, unspecified: Secondary | ICD-10-CM | POA: Diagnosis not present

## 2022-12-24 DIAGNOSIS — N3941 Urge incontinence: Secondary | ICD-10-CM | POA: Diagnosis not present

## 2023-01-28 DIAGNOSIS — Z01419 Encounter for gynecological examination (general) (routine) without abnormal findings: Secondary | ICD-10-CM | POA: Diagnosis not present

## 2023-01-28 DIAGNOSIS — N959 Unspecified menopausal and perimenopausal disorder: Secondary | ICD-10-CM | POA: Diagnosis not present

## 2023-01-28 DIAGNOSIS — Z1231 Encounter for screening mammogram for malignant neoplasm of breast: Secondary | ICD-10-CM | POA: Diagnosis not present

## 2023-01-28 DIAGNOSIS — Z6830 Body mass index (BMI) 30.0-30.9, adult: Secondary | ICD-10-CM | POA: Diagnosis not present

## 2023-04-20 DIAGNOSIS — H35373 Puckering of macula, bilateral: Secondary | ICD-10-CM | POA: Diagnosis not present

## 2023-04-20 DIAGNOSIS — Z0100 Encounter for examination of eyes and vision without abnormal findings: Secondary | ICD-10-CM | POA: Diagnosis not present

## 2023-04-20 DIAGNOSIS — H43393 Other vitreous opacities, bilateral: Secondary | ICD-10-CM | POA: Diagnosis not present

## 2023-04-22 DIAGNOSIS — N3941 Urge incontinence: Secondary | ICD-10-CM | POA: Diagnosis not present

## 2023-04-27 DIAGNOSIS — Z03818 Encounter for observation for suspected exposure to other biological agents ruled out: Secondary | ICD-10-CM | POA: Diagnosis not present

## 2023-04-27 DIAGNOSIS — J441 Chronic obstructive pulmonary disease with (acute) exacerbation: Secondary | ICD-10-CM | POA: Diagnosis not present

## 2023-04-27 DIAGNOSIS — R051 Acute cough: Secondary | ICD-10-CM | POA: Diagnosis not present

## 2023-04-27 DIAGNOSIS — J101 Influenza due to other identified influenza virus with other respiratory manifestations: Secondary | ICD-10-CM | POA: Diagnosis not present

## 2023-04-27 DIAGNOSIS — J069 Acute upper respiratory infection, unspecified: Secondary | ICD-10-CM | POA: Diagnosis not present

## 2023-06-03 DIAGNOSIS — Z0000 Encounter for general adult medical examination without abnormal findings: Secondary | ICD-10-CM | POA: Diagnosis not present

## 2023-06-03 DIAGNOSIS — E785 Hyperlipidemia, unspecified: Secondary | ICD-10-CM | POA: Diagnosis not present

## 2023-06-03 DIAGNOSIS — F330 Major depressive disorder, recurrent, mild: Secondary | ICD-10-CM | POA: Diagnosis not present

## 2023-06-03 DIAGNOSIS — E669 Obesity, unspecified: Secondary | ICD-10-CM | POA: Diagnosis not present

## 2023-06-03 DIAGNOSIS — E559 Vitamin D deficiency, unspecified: Secondary | ICD-10-CM | POA: Diagnosis not present

## 2023-06-03 DIAGNOSIS — Z1331 Encounter for screening for depression: Secondary | ICD-10-CM | POA: Diagnosis not present

## 2023-06-03 DIAGNOSIS — J449 Chronic obstructive pulmonary disease, unspecified: Secondary | ICD-10-CM | POA: Diagnosis not present

## 2023-06-03 DIAGNOSIS — Z79899 Other long term (current) drug therapy: Secondary | ICD-10-CM | POA: Diagnosis not present

## 2023-06-03 DIAGNOSIS — I700 Atherosclerosis of aorta: Secondary | ICD-10-CM | POA: Diagnosis not present

## 2023-06-29 DIAGNOSIS — D2361 Other benign neoplasm of skin of right upper limb, including shoulder: Secondary | ICD-10-CM | POA: Diagnosis not present

## 2023-06-29 DIAGNOSIS — C4441 Basal cell carcinoma of skin of scalp and neck: Secondary | ICD-10-CM | POA: Diagnosis not present

## 2023-06-29 DIAGNOSIS — C44319 Basal cell carcinoma of skin of other parts of face: Secondary | ICD-10-CM | POA: Diagnosis not present

## 2023-06-29 DIAGNOSIS — D1801 Hemangioma of skin and subcutaneous tissue: Secondary | ICD-10-CM | POA: Diagnosis not present

## 2023-06-29 DIAGNOSIS — C44329 Squamous cell carcinoma of skin of other parts of face: Secondary | ICD-10-CM | POA: Diagnosis not present

## 2023-06-29 DIAGNOSIS — L570 Actinic keratosis: Secondary | ICD-10-CM | POA: Diagnosis not present

## 2023-06-29 DIAGNOSIS — Z85828 Personal history of other malignant neoplasm of skin: Secondary | ICD-10-CM | POA: Diagnosis not present

## 2023-06-29 DIAGNOSIS — L821 Other seborrheic keratosis: Secondary | ICD-10-CM | POA: Diagnosis not present

## 2023-06-29 DIAGNOSIS — D485 Neoplasm of uncertain behavior of skin: Secondary | ICD-10-CM | POA: Diagnosis not present

## 2023-08-10 DIAGNOSIS — Z85828 Personal history of other malignant neoplasm of skin: Secondary | ICD-10-CM | POA: Diagnosis not present

## 2023-08-10 DIAGNOSIS — C44319 Basal cell carcinoma of skin of other parts of face: Secondary | ICD-10-CM | POA: Diagnosis not present

## 2023-08-10 DIAGNOSIS — C44329 Squamous cell carcinoma of skin of other parts of face: Secondary | ICD-10-CM | POA: Diagnosis not present

## 2023-08-17 DIAGNOSIS — C44319 Basal cell carcinoma of skin of other parts of face: Secondary | ICD-10-CM | POA: Diagnosis not present

## 2023-08-17 DIAGNOSIS — Z85828 Personal history of other malignant neoplasm of skin: Secondary | ICD-10-CM | POA: Diagnosis not present

## 2023-10-13 DIAGNOSIS — J441 Chronic obstructive pulmonary disease with (acute) exacerbation: Secondary | ICD-10-CM | POA: Diagnosis not present

## 2023-10-23 ENCOUNTER — Other Ambulatory Visit

## 2023-10-23 ENCOUNTER — Ambulatory Visit: Admitting: Gastroenterology

## 2023-10-23 ENCOUNTER — Encounter: Payer: Self-pay | Admitting: Gastroenterology

## 2023-10-23 ENCOUNTER — Telehealth: Payer: Self-pay | Admitting: Nurse Practitioner

## 2023-10-23 VITALS — BP 122/70 | HR 79 | Ht 61.0 in | Wt 159.0 lb

## 2023-10-23 DIAGNOSIS — K625 Hemorrhage of anus and rectum: Secondary | ICD-10-CM | POA: Diagnosis not present

## 2023-10-23 DIAGNOSIS — Z8719 Personal history of other diseases of the digestive system: Secondary | ICD-10-CM | POA: Diagnosis not present

## 2023-10-23 DIAGNOSIS — R1032 Left lower quadrant pain: Secondary | ICD-10-CM | POA: Diagnosis not present

## 2023-10-23 DIAGNOSIS — K5909 Other constipation: Secondary | ICD-10-CM

## 2023-10-23 DIAGNOSIS — Z860101 Personal history of adenomatous and serrated colon polyps: Secondary | ICD-10-CM

## 2023-10-23 LAB — COMPREHENSIVE METABOLIC PANEL WITH GFR
ALT: 18 U/L (ref 0–35)
AST: 21 U/L (ref 0–37)
Albumin: 4.5 g/dL (ref 3.5–5.2)
Alkaline Phosphatase: 77 U/L (ref 39–117)
BUN: 16 mg/dL (ref 6–23)
CO2: 30 meq/L (ref 19–32)
Calcium: 9.6 mg/dL (ref 8.4–10.5)
Chloride: 101 meq/L (ref 96–112)
Creatinine, Ser: 0.69 mg/dL (ref 0.40–1.20)
GFR: 87.95 mL/min (ref >60.00)
Glucose, Bld: 91 mg/dL (ref 70–99)
Potassium: 3.6 meq/L (ref 3.5–5.1)
Sodium: 140 meq/L (ref 135–145)
Total Bilirubin: 0.7 mg/dL (ref 0.2–1.2)
Total Protein: 7.3 g/dL (ref 6.0–8.3)

## 2023-10-23 LAB — CBC WITH DIFFERENTIAL/PLATELET
Basophils Absolute: 0 10*3/uL (ref 0.0–0.1)
Basophils Relative: 0.7 % (ref 0.0–3.0)
Eosinophils Absolute: 0.1 10*3/uL (ref 0.0–0.7)
Eosinophils Relative: 1.7 % (ref 0.0–5.0)
HCT: 39.9 % (ref 36.0–46.0)
Hemoglobin: 13.3 g/dL (ref 12.0–15.0)
Lymphocytes Relative: 21.3 % (ref 12.0–46.0)
Lymphs Abs: 1.3 10*3/uL (ref 0.7–4.0)
MCHC: 33.2 g/dL (ref 30.0–36.0)
MCV: 90.7 fl (ref 78.0–100.0)
Monocytes Absolute: 0.4 10*3/uL (ref 0.1–1.0)
Monocytes Relative: 6.4 % (ref 3.0–12.0)
Neutro Abs: 4.4 10*3/uL (ref 1.4–7.7)
Neutrophils Relative %: 69.9 % (ref 43.0–77.0)
Platelets: 221 10*3/uL (ref 150.0–400.0)
RBC: 4.4 Mil/uL (ref 3.87–5.11)
RDW: 14.7 % (ref 11.5–15.5)
WBC: 6.3 10*3/uL (ref 4.0–10.5)

## 2023-10-23 NOTE — Patient Instructions (Signed)
 Your provider has requested that you go to the basement level for lab work before leaving today. Press B on the elevator. The lab is located at the first door on the left as you exit the elevator.  Due to recent changes in healthcare laws, you may see the results of your imaging and laboratory studies on MyChart before your provider has had a chance to review them.  We understand that in some cases there may be results that are confusing or concerning to you. Not all laboratory results come back in the same time frame and the provider may be waiting for multiple results in order to interpret others.  Please give us  48 hours in order for your provider to thoroughly review all the results before contacting the office for clarification of your results.   We are providing you with a handout on fiber.  We are providing you with samples of IBgard to try.  You have been scheduled for a CT scan of the abdomen and pelvis at [facility]. You are scheduled on 10/26/2023 at 3:00pm arrive at 1:00pm.   You may take any medications as prescribed with a small amount of water, if necessary. If you take any of the following medications: METFORMIN, GLUCOPHAGE, GLUCOVANCE, AVANDAMET, RIOMET, FORTAMET, ACTOPLUS MET, JANUMET, GLUMETZA or METAGLIP, you MAY be asked to HOLD this medication 48 hours AFTER the exam.   The purpose of you drinking the oral contrast is to aid in the visualization of your intestinal tract. The contrast solution may cause some diarrhea. Depending on your individual set of symptoms, you may also receive an intravenous injection of x-ray contrast/dye. Plan on being at [facility] for 45 minutes or longer, depending on the type of exam you are having performed.   If you have any questions regarding your exam or if you need to reschedule, you may call [facility] at [phone] between the hours of 8:00 am and 5:00 pm, Monday-Friday.   I appreciate the opportunity to care for  you. Suzanna Erp, PA

## 2023-10-23 NOTE — Telephone Encounter (Signed)
 Patient calls stating that she has been having achy lower abdominal pain intermittently since the end of May that is very uncomfortable. Has chronic issues with constipation due to previous rectocele and cystocele repair and typically takes miralax  on a daily basis for this. Patient states she did have some constipated bowel movements recently so took some miralax  this morning. Had 1 episode of melena followed by blood mixed in stool and brb on toilet tissue. Has not experienced any SOB, dizziness. No n/v or fever.  Patient has previous history of lower GI bleeding, at one time necessitating hospitalization.   Patient will come for an appointment with Suzanna Erp, PA-C on 10/23/23 at 3 pm.

## 2023-10-23 NOTE — Progress Notes (Unsigned)
 Chief Complaint: Abdominal pain and blood in stool Primary GI MD: Dr. Elvin Hammer  HPI: 68 year old female history of anxiety, arthritis, COPD, SBO, GERD, colon polyps, GI bleed, presents for evaluation of abdominal pain and blood in stool      08/2020: Last seen by Everett Hitt, NP.  History of admission 07/2020 with suspected diverticular bleed  Discussed the use of AI scribe software for clinical note transcription with the patient, who gave verbal consent to proceed.  History of Present Illness  For about a month, she has been experiencing persistent pain primarily on the left side of her abdomen, sometimes radiating downwards. The pain occurs daily and is not completely relieved by bowel movements. It sometimes worsens after eating certain foods.  She has a history of diverticulitis and has been avoiding nuts, peanuts, seeds, and similar foods. Recently, she consumed seedless watermelon and noticed red discoloration in her stool the following morning, which she initially attributed to the watermelon. She has been taking Miralax  regularly to manage constipation due to previous surgeries involving her rectum and bladder.  On the morning of the visit, she experienced constipation and noted her stool was black with red at the end. She demonstrated a video of the toliet flushing with the stool and stool appeared brown with toliet water red tinged. After taking Miralax , her subsequent bowel movement was brown with red, which she documented with a photograph. No use of Pepto or iron supplements. She has a history of diverticular bleeding and is concerned about the current symptoms. The bleeding is not profuse and occurs only with bowel movements. She has noticed bright red blood on tissue paper after wiping.   PREVIOUS GI WORKUP   Colonoscopy 05/27/2019: - Three 1 to 3 mm polyps in the descending colon and in the ascending colon, removed with a cold snare. Resected and retrieved. -  Diverticulosis in the entire examined colon. - The examination was otherwise normal on direct and retroflexion views - 5 year recall  TUBULAR ADENOMA (2 OF 2 FRAGMENTS) - NO HIGH GRADE DYSPLASIA OR MALIGNANCY IDENTIFIED  Past Medical History:  Diagnosis Date   Anxiety    Arthritis    Bronchitis    Cancer (HCC)    basal cell   Colon polyp    COPD (chronic obstructive pulmonary disease) (HCC)    GERD (gastroesophageal reflux disease)    Hemoptysis    Hyperlipidemia    Rhinitis    Symptomatic anemia     Past Surgical History:  Procedure Laterality Date   APPENDECTOMY     BACK SURGERY     Bladder prolapse repair  11/08   COLONOSCOPY     UPPER GASTROINTESTINAL ENDOSCOPY     VESICOVAGINAL FISTULA CLOSURE W/ TAH  1989    Current Outpatient Medications  Medication Sig Dispense Refill   albuterol  (VENTOLIN  HFA) 108 (90 Base) MCG/ACT inhaler Inhale 2 puffs into the lungs every 4 (four) hours as needed for wheezing or shortness of breath.     ALPRAZolam  (XANAX ) 0.5 MG tablet TAKE 1 TABLET BY MOUTH EVERY 6 HOURS AS NEEDED (Patient taking differently: Take 0.5 mg by mouth at bedtime.) 90 tablet 0   Ascorbic Acid  (VITAMIN C) 500 MG tablet Take 500 mg by mouth daily.     aspirin EC 81 MG tablet Take 81 mg by mouth daily.     atorvastatin  (LIPITOR) 40 MG tablet Take 40 mg by mouth daily.     Calcium  Carbonate-Vitamin D  (CALCIUM  PLUS VITAMIN D  PO)  Take 1 capsule by mouth 2 (two) times daily.     cholecalciferol  (VITAMIN D3) 25 MCG (1000 UNIT) tablet Take 1,000 Units by mouth daily.     escitalopram  (LEXAPRO ) 20 MG tablet Take 20 mg by mouth daily.     estradiol (VIVELLE-DOT) 0.1 MG/24HR Place 1 patch onto the skin 2 (two) times a week.     fluconazole (DIFLUCAN) 150 MG tablet Take 150 mg by mouth once.     fluticasone  (FLONASE ) 50 MCG/ACT nasal spray Place 1 spray into both nostrils daily.     hydrocortisone  (ANUSOL -HC) 2.5 % rectal cream Place 1 application rectally at bedtime. As  directed (Patient taking differently: Place 1 application rectally daily as needed for hemorrhoids. As directed) 30 g 1   Lactobacillus (CULTURELLE DIGESTIVE WOMENS PO) Take 1 tablet by mouth daily.     meloxicam  (MOBIC ) 15 MG tablet Take 1 tablet (15 mg total) by mouth daily. 90 tablet 0   montelukast  (SINGULAIR ) 10 MG tablet Take 10 mg by mouth at bedtime.     Multiple Vitamin (MULTIVITAMIN) capsule Take 1 capsule by mouth daily.     MYRBETRIQ 50 MG TB24 tablet Take 50 mg by mouth daily.     omeprazole  (PRILOSEC) 20 MG capsule TAKE 1 CAPSULE BY MOUTH 30 - 60 MINUTES BEFORE THE FIRST BIG MEAL OF THE DAY (Patient taking differently: Take 20 mg by mouth daily.) 30 capsule 11   polyethylene glycol (MIRALAX  / GLYCOLAX ) packet Take 17 g by mouth daily.     valACYclovir  (VALTREX ) 500 MG tablet Take 500 mg by mouth daily.     VYTORIN  10-80 MG per tablet TAKE 1 TABLET BY MOUTH DAILY AT BEDTIME 30 tablet 5   zinc  gluconate 50 MG tablet Take 50 mg by mouth daily.     ferrous sulfate 325 (65 FE) MG tablet Take 325 mg by mouth daily with breakfast. (Patient not taking: Reported on 10/23/2023)     fluticasone  (FLOVENT  HFA) 110 MCG/ACT inhaler Inhale 2 puffs into the lungs daily at 6 (six) AM. (Patient not taking: Reported on 10/23/2023)     No current facility-administered medications for this visit.    Allergies as of 10/23/2023 - Review Complete 10/23/2023  Allergen Reaction Noted   Celebrex [celecoxib]  03/25/2020    Family History  Problem Relation Age of Onset   Coronary artery disease Father    Emphysema Mother    Osteoporosis Mother    Stomach cancer Neg Hx    Colon cancer Neg Hx    Esophageal cancer Neg Hx    Rectal cancer Neg Hx     Social History   Socioeconomic History   Marital status: Married    Spouse name: Not on file   Number of children: 2   Years of education: Not on file   Highest education level: Not on file  Occupational History   Occupation: retired  Tobacco Use    Smoking status: Former    Current packs/day: 0.00    Average packs/day: 1.5 packs/day for 35.0 years (52.5 ttl pk-yrs)    Types: Cigarettes    Start date: 01/04/1975    Quit date: 01/03/2010    Years since quitting: 13.8   Smokeless tobacco: Never   Tobacco comments:    07/20/20 - pt chews nicorette gum  Vaping Use   Vaping status: Never Used  Substance and Sexual Activity   Alcohol use: No    Comment: occ   Drug use: No   Sexual activity:  Not on file  Other Topics Concern   Not on file  Social History Narrative   Not on file   Social Drivers of Health   Financial Resource Strain: Not on file  Food Insecurity: Not on file  Transportation Needs: Not on file  Physical Activity: Not on file  Stress: Not on file  Social Connections: Not on file  Intimate Partner Violence: Not on file    Review of Systems:    Constitutional: No weight loss, fever, chills, weakness or fatigue HEENT: Eyes: No change in vision               Ears, Nose, Throat:  No change in hearing or congestion Skin: No rash or itching Cardiovascular: No chest pain, chest pressure or palpitations   Respiratory: No SOB or cough Gastrointestinal: See HPI and otherwise negative Genitourinary: No dysuria or change in urinary frequency Neurological: No headache, dizziness or syncope Musculoskeletal: No new muscle or joint pain Hematologic: No bleeding or bruising Psychiatric: No history of depression or anxiety    Physical Exam:  Vital signs: BP 122/70   Pulse 79   Ht 5' 1 (1.549 m)   Wt 159 lb (72.1 kg)   BMI 30.04 kg/m   Constitutional: NAD, alert and cooperative Head:  Normocephalic and atraumatic. Eyes:   PEERL, EOMI. No icterus. Conjunctiva pink. Respiratory: Respirations even and unlabored. Lungs clear to auscultation bilaterally.   No wheezes, crackles, or rhonchi.  Cardiovascular:  Regular rate and rhythm. No peripheral edema, cyanosis or pallor.  Gastrointestinal:  Soft, nondistended, nontender.  No rebound or guarding. Normal bowel sounds. No appreciable masses or hepatomegaly. Rectal:  Declines Msk:  Symmetrical without gross deformities. Without edema, no deformity or joint abnormality.  Neurologic:  Alert and  oriented x4;  grossly normal neurologically.  Skin:   Dry and intact without significant lesions or rashes. Psychiatric: Oriented to person, place and time. Demonstrates good judgement and reason without abnormal affect or behaviors.  RELEVANT LABS AND IMAGING: CBC    Component Value Date/Time   WBC 5.4 08/13/2020 1554   RBC 3.53 (L) 08/13/2020 1554   HGB 10.7 (L) 08/13/2020 1554   HCT 31.6 (L) 08/13/2020 1554   PLT 287.0 08/13/2020 1554   MCV 89.6 08/13/2020 1554   MCH 30.4 07/23/2020 0501   MCHC 33.8 08/13/2020 1554   RDW 16.8 (H) 08/13/2020 1554   LYMPHSABS 1.4 08/13/2020 1554   MONOABS 0.4 08/13/2020 1554   EOSABS 0.1 08/13/2020 1554   BASOSABS 0.0 08/13/2020 1554    CMP     Component Value Date/Time   NA 139 07/22/2020 0550   K 3.7 07/22/2020 0550   CL 105 07/22/2020 0550   CO2 26 07/22/2020 0550   GLUCOSE 99 07/22/2020 0550   BUN 17 07/22/2020 0550   CREATININE 0.71 07/22/2020 0550   CALCIUM  8.4 (L) 07/22/2020 0550   PROT 5.2 (L) 07/21/2020 0518   ALBUMIN 3.2 (L) 07/21/2020 0518   AST 18 07/21/2020 0518   ALT 15 07/21/2020 0518   ALKPHOS 56 07/21/2020 0518   BILITOT 0.8 07/21/2020 0518   GFRNONAA >60 07/22/2020 0550   GFRAA >60 05/27/2016 0723     Assessment/Plan:   BRBPR Chronic constipation LLQ pain History of diverticular bleed LLQ pain x 1 month that improves with a bowel movement.  Chronic constipation currently exacerbated and taking MiraLAX  with some relief.  Episode of rectal bleeding this morning blood in the stool and on the tissue paper (bright red).  Declined  rectal exam today.  Patient is concerned for diverticulitis.  It is possible she is having recurrent diverticular bleed, less likely diverticulitis with pain ongoing x 4  weeks but we will need to rule this out.  Also possible this could be IBS-C with bleeding hemorrhoids.  She is due for her colonoscopy January 2026 - CBC/CMP - Stat CT abdomen pelvis with contrast to rule out diverticulitis - Continue MiraLAX  - IBgard samples provided - If CT abdomen pelvis is unrevealing will pursue colonoscopy early - Advised if fever, worsening pain, profuse bleeding please proceed to ED  History of diverticular bleed History of painless hematochezia 07/2020 suspected to be diverticular bleed that resolved.    History of colon polyps Colonoscopy 05/2020 with diverticulosis throughout entire colon, 3 tubular adenomatous polyps. - Due 05/2024  Suzanna Erp, PA-C [facility] 10/23/2023, 3:29 PM  Cc: Olin Bertin, MD

## 2023-10-23 NOTE — Telephone Encounter (Signed)
 Patient requesting to speak with a nurse in regards to her having lower abdominal pain. She also has blood in stool.   Scheduled for ov with PA in August. Last seen in 2022.   Requesting a sooner apt.   Please advise. Thank you

## 2023-10-26 ENCOUNTER — Ambulatory Visit
Admission: RE | Admit: 2023-10-26 | Discharge: 2023-10-26 | Disposition: A | Discharge status: Home / Self Care | Source: Ambulatory Visit | Attending: Gastroenterology | Admitting: Gastroenterology

## 2023-10-26 ENCOUNTER — Ambulatory Visit: Payer: Self-pay | Admitting: Gastroenterology

## 2023-10-26 DIAGNOSIS — R1032 Left lower quadrant pain: Secondary | ICD-10-CM | POA: Insufficient documentation

## 2023-10-26 DIAGNOSIS — K5730 Diverticulosis of large intestine without perforation or abscess without bleeding: Secondary | ICD-10-CM | POA: Diagnosis not present

## 2023-10-26 DIAGNOSIS — Z8719 Personal history of other diseases of the digestive system: Secondary | ICD-10-CM | POA: Insufficient documentation

## 2023-10-26 MED ORDER — METRONIDAZOLE 500 MG PO TABS: 500.0000 mg | ORAL_TABLET | Freq: Three times a day (TID) | ORAL | 0 refills | Status: AC

## 2023-10-26 MED ORDER — CIPROFLOXACIN HCL 500 MG PO TABS: 500.0000 mg | ORAL_TABLET | Freq: Two times a day (BID) | ORAL | 0 refills | Status: AC

## 2023-10-26 MED ORDER — IOHEXOL 9 MG/ML PO SOLN
500.0000 mL | ORAL | Status: AC
  Administered 2023-10-26 (×2): 500 mL via ORAL

## 2023-10-26 MED ORDER — IOHEXOL 300 MG/ML  SOLN
100.0000 mL | Freq: Once | INTRAMUSCULAR | Status: AC | PRN
  Administered 2023-10-26: 100 mL via INTRAVENOUS

## 2023-10-26 NOTE — Progress Notes (Signed)
 Noted

## 2023-10-27 DIAGNOSIS — Z85828 Personal history of other malignant neoplasm of skin: Secondary | ICD-10-CM | POA: Diagnosis not present

## 2023-10-27 DIAGNOSIS — L814 Other melanin hyperpigmentation: Secondary | ICD-10-CM | POA: Diagnosis not present

## 2023-10-27 DIAGNOSIS — L821 Other seborrheic keratosis: Secondary | ICD-10-CM | POA: Diagnosis not present

## 2023-10-27 DIAGNOSIS — D225 Melanocytic nevi of trunk: Secondary | ICD-10-CM | POA: Diagnosis not present

## 2023-10-27 DIAGNOSIS — C44319 Basal cell carcinoma of skin of other parts of face: Secondary | ICD-10-CM | POA: Diagnosis not present

## 2023-10-27 DIAGNOSIS — L820 Inflamed seborrheic keratosis: Secondary | ICD-10-CM | POA: Diagnosis not present

## 2023-10-27 DIAGNOSIS — L570 Actinic keratosis: Secondary | ICD-10-CM | POA: Diagnosis not present

## 2023-10-29 MED ORDER — FLUCONAZOLE 150 MG PO TABS: 150.0000 mg | ORAL_TABLET | Freq: Once | ORAL | 0 refills | Status: AC

## 2023-10-29 MED ORDER — AMOXICILLIN-POT CLAVULANATE 875-125 MG PO TABS: 1.0000 | ORAL_TABLET | Freq: Two times a day (BID) | ORAL | 0 refills | Status: DC

## 2023-11-02 DIAGNOSIS — F411 Generalized anxiety disorder: Secondary | ICD-10-CM | POA: Diagnosis not present

## 2023-11-02 DIAGNOSIS — J441 Chronic obstructive pulmonary disease with (acute) exacerbation: Secondary | ICD-10-CM | POA: Diagnosis not present

## 2023-11-02 DIAGNOSIS — E785 Hyperlipidemia, unspecified: Secondary | ICD-10-CM | POA: Diagnosis not present

## 2023-11-02 DIAGNOSIS — J45909 Unspecified asthma, uncomplicated: Secondary | ICD-10-CM | POA: Diagnosis not present

## 2023-11-11 DIAGNOSIS — J441 Chronic obstructive pulmonary disease with (acute) exacerbation: Secondary | ICD-10-CM | POA: Diagnosis not present

## 2023-11-27 DIAGNOSIS — F411 Generalized anxiety disorder: Secondary | ICD-10-CM | POA: Diagnosis not present

## 2023-12-03 DIAGNOSIS — F411 Generalized anxiety disorder: Secondary | ICD-10-CM | POA: Diagnosis not present

## 2023-12-03 DIAGNOSIS — E785 Hyperlipidemia, unspecified: Secondary | ICD-10-CM | POA: Diagnosis not present

## 2023-12-03 DIAGNOSIS — J441 Chronic obstructive pulmonary disease with (acute) exacerbation: Secondary | ICD-10-CM | POA: Diagnosis not present

## 2023-12-03 DIAGNOSIS — J45909 Unspecified asthma, uncomplicated: Secondary | ICD-10-CM | POA: Diagnosis not present

## 2023-12-11 DIAGNOSIS — J441 Chronic obstructive pulmonary disease with (acute) exacerbation: Secondary | ICD-10-CM | POA: Diagnosis not present

## 2023-12-15 NOTE — Progress Notes (Signed)
 Kimberly Console, PA-C [address] [place], [place]  [address] Phone: [phone]   Gastroenterology Consultation  Referring Provider:     Verena Mems, MD Primary Care Physician:  Verena Mems, MD Primary Gastroenterologist:  Kimberly Console, PA-C / Norleen Kiang, MD  Reason for Consultation:     Follow-up diverticulitis, blood in stool, lower abdominal pain        HPI:   [name] is a 68 y.o. y/o female referred for consultation & management  by Verena Mems, MD. here for follow-up of mild uncomplicated sigmoid diverticulitis and blood in stool.  She was last seen in our office 10/2023 by Con Blower, PA-C to evaluate blood in stool and LLQ lower abdominal pain.  Prior Hx of diverticulitis.  Was taking Miralax  for constipation.  Stat CT showed mild acute sigmoid diverticulitis with no perforation or abscess.  Treated with Cipro  500 twice daily and Flagyl  500 3 times daily x 10 days.  Patient took these antibiotics for 5 days and had to discontinue because they made her feel bad.  She was switched to Augmentin  twice daily x 10 days and finished all of that.  LLQ pain resolved.  She is not having any more LLQ pain.  Has not had any episodes of rectal bleeding since her last visit.  Currently taking MiraLAX  daily and has 1 or 2 soft bowel movements daily.  No hard stools or straining.  10/23/2023 labs: Normal CBC and CMP.  WBC 6.3, Hgb 13.3.  10/26/2023 CT abdomen pelvis with contrast: 1. Severe sigmoid diverticulosis with possible mild acute diverticulitis. No abscess or perforation. 2. No bowel obstruction. 3.  Aortic Atherosclerosis  05/2019 Last colonoscopy: 3 small (1 mm to 3 mm) tubular adenoma polyps removed.  No dysplasia.  Pandiverticulosis.  5-year repeat (due 05/2024).  PMH: anxiety, arthritis, COPD, SBO, GERD, colon polyps, Hx GI Diverticular bleed (07/2020).  Previous rectal and bladder surgeries.  Past Medical History:  Diagnosis Date   Anxiety     Arthritis    Basal cell carcinoma    basal cell   Bronchitis    Colon polyp    COPD (chronic obstructive pulmonary disease) (HCC)    GERD (gastroesophageal reflux disease)    Hemoptysis    Hyperlipidemia    Rhinitis    Symptomatic anemia     Past Surgical History:  Procedure Laterality Date   APPENDECTOMY     BACK SURGERY     Bladder prolapse repair  11/08   COLONOSCOPY     UPPER GASTROINTESTINAL ENDOSCOPY     VESICOVAGINAL FISTULA CLOSURE W/ TAH  1989    Prior to Admission medications   Medication Sig Start Date End Date Taking? Authorizing Provider  albuterol  (VENTOLIN  HFA) 108 (90 Base) MCG/ACT inhaler Inhale 2 puffs into the lungs every 4 (four) hours as needed for wheezing or shortness of breath. 03/29/19   [provider]  ALPRAZolam  (XANAX ) 0.5 MG tablet TAKE 1 TABLET BY MOUTH EVERY 6 HOURS AS NEEDED Patient taking differently: Take 0.5 mg by mouth at bedtime. 07/16/12   Darlean Ozell NOVAK, MD  Ascorbic Acid  (VITAMIN C) 500 MG tablet Take 500 mg by mouth daily.    [provider]  aspirin EC 81 MG tablet Take 81 mg by mouth daily.    [provider]  atorvastatin  (LIPITOR) 40 MG tablet Take 40 mg by mouth daily.    [provider]  Calcium  Carbonate-Vitamin D  (CALCIUM  PLUS VITAMIN D  PO)  Take 1 capsule by mouth 2 (two) times daily.    [provider]  cholecalciferol  (VITAMIN D3) 25 MCG (1000 UNIT) tablet Take 1,000 Units by mouth daily.    [provider]  escitalopram  (LEXAPRO ) 20 MG tablet Take 20 mg by mouth daily.    [provider]  estradiol (VIVELLE-DOT) 0.1 MG/24HR Place 1 patch onto the skin 2 (two) times a week.    [provider]  ferrous sulfate 325 (65 FE) MG tablet Take 325 mg by mouth daily with breakfast. Patient not taking: Reported on 10/23/2023    [provider]  fluticasone  (FLONASE ) 50 MCG/ACT nasal spray Place 1 spray into both nostrils daily.    [provider]   fluticasone  (FLOVENT  HFA) 110 MCG/ACT inhaler Inhale 2 puffs into the lungs daily at 6 (six) AM. Patient not taking: Reported on 10/23/2023    [provider]  hydrocortisone  (ANUSOL -HC) 2.5 % rectal cream Place 1 application rectally at bedtime. As directed Patient taking differently: Place 1 application rectally daily as needed for hemorrhoids. As directed 04/21/19   Abran Norleen SAILOR, MD  Lactobacillus (CULTURELLE DIGESTIVE WOMENS PO) Take 1 tablet by mouth daily.    [provider]  meloxicam  (MOBIC ) 15 MG tablet Take 1 tablet (15 mg total) by mouth daily. 03/14/20   Janit Thresa HERO, DPM  montelukast  (SINGULAIR ) 10 MG tablet Take 10 mg by mouth at bedtime. 08/19/10 10/23/23  Darlean Ozell NOVAK, MD  Multiple Vitamin (MULTIVITAMIN) capsule Take 1 capsule by mouth daily.    [provider]  MYRBETRIQ 50 MG TB24 tablet Take 50 mg by mouth daily. 09/19/23   [provider]  omeprazole  (PRILOSEC) 20 MG capsule TAKE 1 CAPSULE BY MOUTH 30 - 60 MINUTES BEFORE THE FIRST BIG MEAL OF THE DAY Patient taking differently: Take 20 mg by mouth daily. 07/17/12   Darlean Ozell NOVAK, MD  polyethylene glycol (MIRALAX  / GLYCOLAX ) packet Take 17 g by mouth daily.    [provider]  valACYclovir  (VALTREX ) 500 MG tablet Take 500 mg by mouth daily.    [provider]  VYTORIN  10-80 MG per tablet TAKE 1 TABLET BY MOUTH DAILY AT BEDTIME 07/28/12   Darlean Ozell NOVAK, MD  zinc  gluconate 50 MG tablet Take 50 mg by mouth daily.    [provider]  diphenhydramine-acetaminophen  (TYLENOL  PM) 25-500 MG TABS Take 1 tablet by mouth at bedtime as needed.    07/11/11  [provider]    Family History  Problem Relation Age of Onset   Coronary artery disease Father    Emphysema Mother    Osteoporosis Mother    Stomach cancer Neg Hx    Colon cancer Neg Hx    Esophageal cancer Neg Hx    Rectal cancer Neg Hx      Social History   Tobacco Use   Smoking status: Former     Current packs/day: 0.00    Average packs/day: 1.5 packs/day for 35.0 years (52.5 ttl pk-yrs)    Types: Cigarettes    Start date: 01/04/1975    Quit date: 01/03/2010    Years since quitting: 13.9   Smokeless tobacco: Never   Tobacco comments:    07/20/20 - pt chews nicorette gum  Vaping Use   Vaping status: Never Used  Substance Use Topics   Alcohol use: No    Comment: occ   Drug use: No    Allergies as of 12/16/2023 - Review Complete 12/16/2023  Allergen Reaction  Noted   Celebrex [celecoxib]  03/25/2020    Review of Systems:    All systems reviewed and negative except where noted in HPI.   Physical Exam:  BP 110/72 (BP Location: Left Arm, Patient Position: Sitting, Cuff Size: Normal)   Pulse 76   Ht 5' 1 (1.549 m) Comment: height measured without shoes  Wt 159 lb 2 oz (72.2 kg)   BMI 30.07 kg/m  No LMP recorded. Patient has had a hysterectomy.  General:   Alert,  Well-developed, well-nourished, pleasant and cooperative in NAD Lungs:  Respirations even and unlabored.  Clear throughout to auscultation.   No wheezes, crackles, or rhonchi. No acute distress. Heart:  Regular rate and rhythm; no murmurs, clicks, rubs, or gallops. Abdomen:  Normal bowel sounds.  No bruits.  Soft, and non-distended without masses, hepatosplenomegaly or hernias noted.  No abdominal tenderness.  No LLQ tenderness at all today.  No guarding or rebound tenderness.    Neurologic:  Alert and oriented x3;  grossly normal neurologically. Psych:  Alert and cooperative. Normal mood and affect.  Imaging Studies: No results found.  Labs: CBC    Component Value Date/Time   WBC 6.3 10/23/2023 1607   RBC 4.40 10/23/2023 1607   HGB 13.3 10/23/2023 1607   HCT 39.9 10/23/2023 1607   PLT 221.0 10/23/2023 1607   MCV 90.7 10/23/2023 1607    CMP     Component Value Date/Time   NA 140 10/23/2023 1607   K 3.6 10/23/2023 1607   CL 101 10/23/2023 1607   CO2 30 10/23/2023 1607   GLUCOSE 91 10/23/2023 1607    BUN 16 10/23/2023 1607   CREATININE 0.69 10/23/2023 1607   CALCIUM  9.6 10/23/2023 1607   PROT 7.3 10/23/2023 1607   ALBUMIN 4.5 10/23/2023 1607   AST 21 10/23/2023 1607   ALT 18 10/23/2023 1607   ALKPHOS 77 10/23/2023 1607   BILITOT 0.7 10/23/2023 1607   GFRNONAA >60 07/22/2020 0550   GFRAA >60 05/27/2016 0723    Assessment and Plan:   ADYA WIRZ is a 68 y.o. y/o female has been referred for follow-up of:  1.  Mild uncomplicated sigmoid diverticulitis found on CT 10/26/2023 treated with Cipro  and Flagyl  x 5 days and Augmentin  x 10 days.  Clinically resolved.  No LLQ tenderness today.  2.  Pandiverticulosis.  History of diverticular bleed in 2022.  No current active bleeding.  No anemia. - Stressed the importance of treating underlying constipation.  3.  Blood in stool; no anemia.  Normal Hgb 13.3. - Resolved. - Scheduling updated repeat colonoscopy I discussed risks of colonoscopy with patient to include risk of bleeding, colon perforation, and risk of sedation.  Patient expressed understanding and agrees to proceed with colonoscopy.   4.  Constipation: Improved - Continue MiraLAX  1 or 2 capfuls daily  5.  History of adenomatous colon polyps - Scheduling updated repeat colonoscopy I discussed risks of colonoscopy with patient to include risk of bleeding, colon perforation, and risk of sedation.  Patient expressed understanding and agrees to proceed with colonoscopy.   Follow up as needed based on colonoscopy results and GI symptoms.  Kimberly Console, PA-C

## 2023-12-16 ENCOUNTER — Ambulatory Visit: Admitting: Physician Assistant

## 2023-12-16 ENCOUNTER — Encounter: Payer: Self-pay | Admitting: Physician Assistant

## 2023-12-16 ENCOUNTER — Encounter: Payer: Self-pay | Admitting: Internal Medicine

## 2023-12-16 VITALS — BP 110/72 | HR 76 | Ht 61.0 in | Wt 159.1 lb

## 2023-12-16 DIAGNOSIS — Z8719 Personal history of other diseases of the digestive system: Secondary | ICD-10-CM

## 2023-12-16 DIAGNOSIS — K5730 Diverticulosis of large intestine without perforation or abscess without bleeding: Secondary | ICD-10-CM | POA: Diagnosis not present

## 2023-12-16 DIAGNOSIS — Z860101 Personal history of adenomatous and serrated colon polyps: Secondary | ICD-10-CM

## 2023-12-16 DIAGNOSIS — K5904 Chronic idiopathic constipation: Secondary | ICD-10-CM

## 2023-12-16 DIAGNOSIS — Z860100 Personal history of colon polyps, unspecified: Secondary | ICD-10-CM

## 2023-12-16 DIAGNOSIS — K5900 Constipation, unspecified: Secondary | ICD-10-CM

## 2023-12-16 MED ORDER — NA SULFATE-K SULFATE-MG SULF 17.5-3.13-1.6 GM/177ML PO SOLN: 1.0000 | Freq: Once | ORAL | 0 refills | Status: AC

## 2023-12-16 NOTE — Patient Instructions (Signed)
 You have been scheduled for a Colonoscopy. Please follow written instructions given to you at your visit today.   If you use inhalers (even only as needed), please bring them with you on the day of your procedure.  DO NOT TAKE 7 DAYS PRIOR TO TEST- Trulicity (dulaglutide) Ozempic, Wegovy (semaglutide) Mounjaro (tirzepatide) Bydureon Bcise (exanatide extended release)  DO NOT TAKE 1 DAY PRIOR TO YOUR TEST Rybelsus (semaglutide) Adlyxin (lixisenatide) Victoza (liraglutide) Byetta (exanatide) ___________________________________________________________________________  Please follow up sooner if symptoms increase or worsen   Due to recent changes in healthcare laws, you may see the results of your imaging and laboratory studies on MyChart before your provider has had a chance to review them.  We understand that in some cases there may be results that are confusing or concerning to you. Not all laboratory results come back in the same time frame and the provider may be waiting for multiple results in order to interpret others.  Please give us  48 hours in order for your provider to thoroughly review all the results before contacting the office for clarification of your results.   Thank you for trusting me with your gastrointestinal care!   Ellouise Console, PA-C _______________________________________________________  If your blood pressure at your visit was 140/90 or greater, please contact your primary care physician to follow up on this.  _______________________________________________________  If you are age 22 or older, your body mass index should be between 23-30. Your Body mass index is 30.07 kg/m. If this is out of the aforementioned range listed, please consider follow up with your Primary Care Provider.  If you are age 51 or younger, your body mass index should be between 19-25. Your Body mass index is 30.07 kg/m. If this is out of the aformentioned range listed, please consider  follow up with your Primary Care Provider.   ________________________________________________________  The [facility] providers would like to encourage you to use MYCHART to communicate with providers for non-urgent requests or questions.  Due to long hold times on the telephone, sending your provider a message by [facility] may be a faster and more efficient way to get a response.  Please allow 48 business hours for a response.  Please remember that this is for non-urgent requests.  _______________________________________________________

## 2023-12-16 NOTE — Progress Notes (Signed)
 Noted

## 2023-12-29 ENCOUNTER — Encounter: Payer: Self-pay | Admitting: Internal Medicine

## 2023-12-29 ENCOUNTER — Ambulatory Visit: Admitting: Internal Medicine

## 2023-12-29 VITALS — BP 124/64 | HR 65 | Temp 97.7°F | Resp 11 | Ht 61.0 in | Wt 159.0 lb

## 2023-12-29 DIAGNOSIS — K5730 Diverticulosis of large intestine without perforation or abscess without bleeding: Secondary | ICD-10-CM | POA: Diagnosis not present

## 2023-12-29 DIAGNOSIS — D122 Benign neoplasm of ascending colon: Secondary | ICD-10-CM

## 2023-12-29 DIAGNOSIS — Z860100 Personal history of colon polyps, unspecified: Secondary | ICD-10-CM

## 2023-12-29 DIAGNOSIS — K5904 Chronic idiopathic constipation: Secondary | ICD-10-CM | POA: Diagnosis not present

## 2023-12-29 DIAGNOSIS — K5900 Constipation, unspecified: Secondary | ICD-10-CM

## 2023-12-29 DIAGNOSIS — Z860101 Personal history of adenomatous and serrated colon polyps: Secondary | ICD-10-CM

## 2023-12-29 DIAGNOSIS — D120 Benign neoplasm of cecum: Secondary | ICD-10-CM | POA: Diagnosis not present

## 2023-12-29 DIAGNOSIS — D123 Benign neoplasm of transverse colon: Secondary | ICD-10-CM

## 2023-12-29 DIAGNOSIS — Z8719 Personal history of other diseases of the digestive system: Secondary | ICD-10-CM | POA: Diagnosis not present

## 2023-12-29 DIAGNOSIS — Z1211 Encounter for screening for malignant neoplasm of colon: Secondary | ICD-10-CM | POA: Diagnosis not present

## 2023-12-29 DIAGNOSIS — J449 Chronic obstructive pulmonary disease, unspecified: Secondary | ICD-10-CM | POA: Diagnosis not present

## 2023-12-29 DIAGNOSIS — F419 Anxiety disorder, unspecified: Secondary | ICD-10-CM | POA: Diagnosis not present

## 2023-12-29 MED ORDER — SODIUM CHLORIDE 0.9 % IV SOLN: 500.0000 mL | INTRAVENOUS | Status: DC

## 2023-12-29 NOTE — Progress Notes (Signed)
 Report to PACU, RN, vss, BBS= Clear.

## 2023-12-29 NOTE — Progress Notes (Signed)
 Expand All Collapse All       Ellouise Console, PA-C [address] [place], [place]  [address] Phone: [phone]   Gastroenterology Consultation   Referring Provider:     Verena Mems, MD Primary Care Physician:  Verena Mems, MD Primary Gastroenterologist:  Ellouise Console, PA-C / Norleen Kiang, MD  Reason for Consultation:     Follow-up diverticulitis, blood in stool, lower abdominal pain        HPI:   [name] is a 68 y.o. y/o female referred for consultation & management  by Verena Mems, MD. here for follow-up of mild uncomplicated sigmoid diverticulitis and blood in stool.   She was last seen in our office 10/2023 by Con Blower, PA-C to evaluate blood in stool and LLQ lower abdominal pain.  Prior Hx of diverticulitis.  Was taking Miralax  for constipation.  Stat CT showed mild acute sigmoid diverticulitis with no perforation or abscess.  Treated with Cipro  500 twice daily and Flagyl  500 3 times daily x 10 days.  Patient took these antibiotics for 5 days and had to discontinue because they made her feel bad.  She was switched to Augmentin  twice daily x 10 days and finished all of that.  LLQ pain resolved.  She is not having any more LLQ pain.  Has not had any episodes of rectal bleeding since her last visit.  Currently taking MiraLAX  daily and has 1 or 2 soft bowel movements daily.  No hard stools or straining.   10/23/2023 labs: Normal CBC and CMP.  WBC 6.3, Hgb 13.3.   10/26/2023 CT abdomen pelvis with contrast: 1. Severe sigmoid diverticulosis with possible mild acute diverticulitis. No abscess or perforation. 2. No bowel obstruction. 3.  Aortic Atherosclerosis   05/2019 Last colonoscopy: 3 small (1 mm to 3 mm) tubular adenoma polyps removed.  No dysplasia.  Pandiverticulosis.  5-year repeat (due 05/2024).   PMH: anxiety, arthritis, COPD, SBO, GERD, colon polyps, Hx GI Diverticular bleed (07/2020).  Previous rectal and bladder surgeries.       Past Medical History:   Diagnosis Date   Anxiety     Arthritis     Basal cell carcinoma      basal cell   Bronchitis     Colon polyp     COPD (chronic obstructive pulmonary disease) (HCC)     GERD (gastroesophageal reflux disease)     Hemoptysis     Hyperlipidemia     Rhinitis     Symptomatic anemia                 Past Surgical History:  Procedure Laterality Date   APPENDECTOMY       BACK SURGERY       Bladder prolapse repair   11/08   COLONOSCOPY       UPPER GASTROINTESTINAL ENDOSCOPY       VESICOVAGINAL FISTULA CLOSURE W/ TAH   1989                 Prior to Admission medications   Medication Sig Start Date End Date Taking? Authorizing Provider  albuterol  (VENTOLIN  HFA) 108 (90 Base) MCG/ACT inhaler Inhale 2 puffs into the lungs every 4 (four) hours as needed for wheezing or shortness of breath. 03/29/19     [provider]  ALPRAZolam  (XANAX ) 0.5 MG tablet TAKE 1 TABLET BY MOUTH EVERY 6 HOURS AS NEEDED Patient taking differently: Take 0.5 mg by mouth at bedtime. 07/16/12     Darlean Sharper  B, MD  Ascorbic Acid  (VITAMIN C) 500 MG tablet Take 500 mg by mouth daily.       [provider]  aspirin EC 81 MG tablet Take 81 mg by mouth daily.       [provider]  atorvastatin  (LIPITOR) 40 MG tablet Take 40 mg by mouth daily.       [provider]  Calcium  Carbonate-Vitamin D  (CALCIUM  PLUS VITAMIN D  PO) Take 1 capsule by mouth 2 (two) times daily.       [provider]  cholecalciferol  (VITAMIN D3) 25 MCG (1000 UNIT) tablet Take 1,000 Units by mouth daily.       [provider]  escitalopram  (LEXAPRO ) 20 MG tablet Take 20 mg by mouth daily.       [provider]  estradiol (VIVELLE-DOT) 0.1 MG/24HR Place 1 patch onto the skin 2 (two) times a week.       [provider]  ferrous sulfate 325 (65 FE) MG tablet Take 325 mg by mouth daily with breakfast. Patient not taking: Reported on 10/23/2023       [provider]   fluticasone  (FLONASE ) 50 MCG/ACT nasal spray Place 1 spray into both nostrils daily.       [provider]  fluticasone  (FLOVENT  HFA) 110 MCG/ACT inhaler Inhale 2 puffs into the lungs daily at 6 (six) AM. Patient not taking: Reported on 10/23/2023       [provider]  hydrocortisone  (ANUSOL -HC) 2.5 % rectal cream Place 1 application rectally at bedtime. As directed Patient taking differently: Place 1 application rectally daily as needed for hemorrhoids. As directed 04/21/19     Abran Norleen SAILOR, MD  Lactobacillus (CULTURELLE DIGESTIVE WOMENS PO) Take 1 tablet by mouth daily.       [provider]  meloxicam  (MOBIC ) 15 MG tablet Take 1 tablet (15 mg total) by mouth daily. 03/14/20     Janit Thresa HERO, DPM  montelukast  (SINGULAIR ) 10 MG tablet Take 10 mg by mouth at bedtime. 08/19/10 10/23/23   Darlean Ozell NOVAK, MD  Multiple Vitamin (MULTIVITAMIN) capsule Take 1 capsule by mouth daily.       [provider]  MYRBETRIQ 50 MG TB24 tablet Take 50 mg by mouth daily. 09/19/23     [provider]  omeprazole  (PRILOSEC) 20 MG capsule TAKE 1 CAPSULE BY MOUTH 30 - 60 MINUTES BEFORE THE FIRST BIG MEAL OF THE DAY Patient taking differently: Take 20 mg by mouth daily. 07/17/12     Darlean Ozell NOVAK, MD  polyethylene glycol (MIRALAX  / GLYCOLAX ) packet Take 17 g by mouth daily.       [provider]  valACYclovir  (VALTREX ) 500 MG tablet Take 500 mg by mouth daily.       [provider]  VYTORIN  10-80 MG per tablet TAKE 1 TABLET BY MOUTH DAILY AT BEDTIME 07/28/12     Darlean Ozell NOVAK, MD  zinc  gluconate 50 MG tablet Take 50 mg by mouth daily.       [provider]  diphenhydramine-acetaminophen  (TYLENOL  PM) 25-500 MG TABS Take 1 tablet by mouth at bedtime as needed.     07/11/11   [provider]           Family History  Problem Relation Age of Onset   Coronary artery disease Father     Emphysema Mother     Osteoporosis Mother     Stomach  cancer Neg Hx     Colon  cancer Neg Hx     Esophageal cancer Neg Hx     Rectal cancer Neg Hx            Social History  Social History         Tobacco Use   Smoking status: Former      Current packs/day: 0.00      Average packs/day: 1.5 packs/day for 35.0 years (52.5 ttl pk-yrs)      Types: Cigarettes      Start date: 01/04/1975      Quit date: 01/03/2010      Years since quitting: 13.9   Smokeless tobacco: Never   Tobacco comments:      07/20/20 - pt chews nicorette gum  Vaping Use   Vaping status: Never Used  Substance Use Topics   Alcohol use: No      Comment: occ   Drug use: No             Allergies as of 12/16/2023 - Review Complete 12/16/2023  Allergen Reaction Noted   Celebrex [celecoxib]   03/25/2020      Review of Systems:    All systems reviewed and negative except where noted in HPI.    Physical Exam:  BP 110/72 (BP Location: Left Arm, Patient Position: Sitting, Cuff Size: Normal)   Pulse 76   Ht 5' 1 (1.549 m) Comment: height measured without shoes  Wt 159 lb 2 oz (72.2 kg)   BMI 30.07 kg/m  No LMP recorded. Patient has had a hysterectomy.   General:   Alert,  Well-developed, well-nourished, pleasant and cooperative in NAD Lungs:  Respirations even and unlabored.  Clear throughout to auscultation.   No wheezes, crackles, or rhonchi. No acute distress. Heart:  Regular rate and rhythm; no murmurs, clicks, rubs, or gallops. Abdomen:  Normal bowel sounds.  No bruits.  Soft, and non-distended without masses, hepatosplenomegaly or hernias noted.  No abdominal tenderness.  No LLQ tenderness at all today.  No guarding or rebound tenderness.    Neurologic:  Alert and oriented x3;  grossly normal neurologically. Psych:  Alert and cooperative. Normal mood and affect.   Imaging Studies: Imaging Results  No results found.     Labs: CBC Labs (Brief)          Component Value Date/Time    WBC 6.3 10/23/2023 1607    RBC 4.40 10/23/2023 1607    HGB 13.3  10/23/2023 1607    HCT 39.9 10/23/2023 1607    PLT 221.0 10/23/2023 1607    MCV 90.7 10/23/2023 1607        CMP     Labs (Brief)          Component Value Date/Time    NA 140 10/23/2023 1607    K 3.6 10/23/2023 1607    CL 101 10/23/2023 1607    CO2 30 10/23/2023 1607    GLUCOSE 91 10/23/2023 1607    BUN 16 10/23/2023 1607    CREATININE 0.69 10/23/2023 1607    CALCIUM  9.6 10/23/2023 1607    PROT 7.3 10/23/2023 1607    ALBUMIN 4.5 10/23/2023 1607    AST 21 10/23/2023 1607    ALT 18 10/23/2023 1607    ALKPHOS 77 10/23/2023 1607    BILITOT 0.7 10/23/2023 1607    GFRNONAA >60 07/22/2020 0550    GFRAA >60 05/27/2016 0723        Assessment and Plan:    JONESHA TSUCHIYA is a 68 y.o. y/o female  has been referred for follow-up of:   1.  Mild uncomplicated sigmoid diverticulitis found on CT 10/26/2023 treated with Cipro  and Flagyl  x 5 days and Augmentin  x 10 days.  Clinically resolved.  No LLQ tenderness today.   2.  Pandiverticulosis.  History of diverticular bleed in 2022.  No current active bleeding.  No anemia. - Stressed the importance of treating underlying constipation.   3.  Blood in stool; no anemia.  Normal Hgb 13.3. - Resolved. - Scheduling updated repeat colonoscopy I discussed risks of colonoscopy with patient to include risk of bleeding, colon perforation, and risk of sedation.  Patient expressed understanding and agrees to proceed with colonoscopy.    4.  Constipation: Improved - Continue MiraLAX  1 or 2 capfuls daily   5.  History of adenomatous colon polyps - Scheduling updated repeat colonoscopy I discussed risks of colonoscopy with patient to include risk of bleeding, colon perforation, and risk of sedation.  Patient expressed understanding and agrees to proceed with colonoscopy.    Follow up as needed based on colonoscopy results and GI symptoms.   Ellouise Console, PA-C

## 2023-12-29 NOTE — Patient Instructions (Signed)
 Repeat colonoscopy in 5 years for surveillance. Resume previous diet. Continue present medications. Awaiting pathology results. Handouts provided on polyps and diverticulosis.  YOU HAD AN ENDOSCOPIC PROCEDURE TODAY AT THE [facility]:   Refer to the procedure report that was given to you for any specific questions about what was found during the examination.  If the procedure report does not answer your questions, please call your gastroenterologist to clarify.  If you requested that your care partner not be given the details of your procedure findings, then the procedure report has been included in a sealed envelope for you to review at your convenience later.  YOU SHOULD EXPECT: Some feelings of bloating in the abdomen. Passage of more gas than usual.  Walking can help get rid of the air that was put into your GI tract during the procedure and reduce the bloating. If you had a lower endoscopy (such as a colonoscopy or flexible sigmoidoscopy) you may notice spotting of blood in your stool or on the toilet paper. If you underwent a bowel prep for your procedure, you may not have a normal bowel movement for a few days.  Please Note:  You might notice some irritation and congestion in your nose or some drainage.  This is from the oxygen used during your procedure.  There is no need for concern and it should clear up in a day or so.  SYMPTOMS TO REPORT IMMEDIATELY:  Following lower endoscopy (colonoscopy or flexible sigmoidoscopy):  Excessive amounts of blood in the stool  Significant tenderness or worsening of abdominal pains  Swelling of the abdomen that is new, acute  Fever of 100F or higher  For urgent or emergent issues, a gastroenterologist can be reached at any hour by calling [phone]. Do not use MyChart messaging for urgent concerns.    DIET:  We do recommend a small meal at first, but then you may proceed to your regular diet.  Drink plenty of fluids but you should  avoid alcoholic beverages for 24 hours.  ACTIVITY:  You should plan to take it easy for the rest of today and you should NOT DRIVE or use heavy machinery until tomorrow (because of the sedation medicines used during the test).    FOLLOW UP: Our staff will call the number listed on your records the next business day following your procedure.  We will call around 7:15- 8:00 am to check on you and address any questions or concerns that you may have regarding the information given to you following your procedure. If we do not reach you, we will leave a message.     If any biopsies were taken you will be contacted by phone or by letter within the next 1-3 weeks.  Please call us  at [phone] if you have not heard about the biopsies in 3 weeks.    SIGNATURES/CONFIDENTIALITY: You and/or your care partner have signed paperwork which will be entered into your electronic medical record.  These signatures attest to the fact that that the information above on your After Visit Summary has been reviewed and is understood.  Full responsibility of the confidentiality of this discharge information lies with you and/or your care-partner.

## 2023-12-29 NOTE — Progress Notes (Signed)
 Pt's states no medical or surgical changes since previsit or office visit.

## 2023-12-29 NOTE — Op Note (Signed)
 [facility] Patient Name: [name] Procedure Date: 12/29/2023 7:22 AM MRN: [id number] Endoscopist: Norleen SAILOR. Abran , MD, [id number] Age: 68 Referring MD:  Date of Birth: 07-Dec-1955 Gender: Female Account #: [id number] Procedure:                Colonoscopy with cold snare polypectomy x 2; biopsy                            polypectomy x 1 Indications:              High risk colon cancer surveillance: Personal                            history of multiple (3 or more) adenomas. Previous                            examinations 2005, 2010, 2013, 2018, 2021 Medicines:                Monitored Anesthesia Care Procedure:                Pre-Anesthesia Assessment:                           - Prior to the procedure, a History and Physical                            was performed, and patient medications and                            allergies were reviewed. The patient's tolerance of                            previous anesthesia was also reviewed. The risks                            and benefits of the procedure and the sedation                            options and risks were discussed with the patient.                            All questions were answered, and informed consent                            was obtained. Prior Anticoagulants: The patient has                            taken no anticoagulant or antiplatelet agents. ASA                            Grade Assessment: II - A patient with mild systemic                            disease. After reviewing the risks and benefits,  the patient was deemed in satisfactory condition to                            undergo the procedure.                           After obtaining informed consent, the colonoscope                            was passed under direct vision. Throughout the                            procedure, the patient's blood pressure, pulse, and                            oxygen  saturations were monitored continuously. The                            CF [id number] #[id number] was introduced through the anus                            and advanced to the the cecum, identified by                            appendiceal orifice and ileocecal valve. The                            ileocecal valve, appendiceal orifice, and rectum                            were photographed. The quality of the bowel                            preparation was good. The colonoscopy was performed                            without difficulty. The patient tolerated the                            procedure well. The bowel preparation used was                            SUPREP via split dose instruction. Scope In: 8:50:17 AM Scope Out: 9:06:38 AM Scope Withdrawal Time: 0 hours 7 minutes 52 seconds  Total Procedure Duration: 0 hours 16 minutes 21 seconds  Findings:                 Two polyps were found in the transverse colon and                            cecum. The polyps were 3 to 4 mm in size. These                            polyps were removed with a cold  snare. Resection                            and retrieval were complete.                           A less than 1 mm polyp was found in the ascending                            colon. The polyp was removed with a jumbo cold                            forceps. Resection and retrieval were complete.                           Many diverticula were found in the entire colon.                           The exam was otherwise without abnormality on                            direct and retroflexion views. Complications:            No immediate complications. Estimated blood loss:                            None. Estimated Blood Loss:     Estimated blood loss: none. Impression:               - Two 3 to 4 mm polyps in the transverse colon and                            in the cecum, removed with a cold snare. Resected                            and  retrieved.                           - One less than 1 mm polyp in the ascending colon,                            removed with a jumbo cold forceps. Resected and                            retrieved.                           - Diverticulosis in the entire examined colon.                           - The examination was otherwise normal on direct                            and retroflexion views. Recommendation:           - Repeat colonoscopy in 5  years for surveillance.                           - Patient has a contact number available for                            emergencies. The signs and symptoms of potential                            delayed complications were discussed with the                            patient. Return to normal activities tomorrow.                            Written discharge instructions were provided to the                            patient.                           - Resume previous diet.                           - Continue present medications.                           - Await pathology results. Norleen SAILOR. Abran, MD 12/29/2023 9:18:37 AM This report has been signed electronically.

## 2023-12-29 NOTE — Progress Notes (Signed)
 Called to room to assist during endoscopic procedure.  Patient ID and intended procedure confirmed with present staff. Received instructions for my participation in the procedure from the performing physician.

## 2023-12-30 ENCOUNTER — Telehealth: Payer: Self-pay | Admitting: *Deleted

## 2023-12-30 NOTE — Telephone Encounter (Signed)
  Follow up Call-     12/29/2023    7:45 AM  Call back number  Post procedure Call Back phone  # [phone]  Permission to leave phone message Yes     Patient questions:  Do you have a fever, pain , or abdominal swelling? No. Pain Score  0 *  Have you tolerated food without any problems? Yes.    Have you been able to return to your normal activities? Yes.    Do you have any questions about your discharge instructions: Diet   No. Medications  No. Follow up visit  No.  Do you have questions or concerns about your Care? No.  Actions: * If pain score is 4 or above: No action needed, pain <4.

## 2023-12-31 LAB — SURGICAL PATHOLOGY

## 2024-01-01 ENCOUNTER — Ambulatory Visit: Payer: Self-pay | Admitting: Internal Medicine

## 2024-01-03 DIAGNOSIS — J441 Chronic obstructive pulmonary disease with (acute) exacerbation: Secondary | ICD-10-CM | POA: Diagnosis not present

## 2024-01-03 DIAGNOSIS — E785 Hyperlipidemia, unspecified: Secondary | ICD-10-CM | POA: Diagnosis not present

## 2024-01-03 DIAGNOSIS — F411 Generalized anxiety disorder: Secondary | ICD-10-CM | POA: Diagnosis not present

## 2024-01-03 DIAGNOSIS — J45909 Unspecified asthma, uncomplicated: Secondary | ICD-10-CM | POA: Diagnosis not present
